# Patient Record
Sex: Female | Born: 1945
Health system: Southern US, Community
[De-identification: ages and names within clinical notes are randomized; demographics above are authoritative.]

## PROBLEM LIST (undated history)

## (undated) DIAGNOSIS — E7800 Pure hypercholesterolemia, unspecified: Secondary | ICD-10-CM

## (undated) DIAGNOSIS — D219 Benign neoplasm of connective and other soft tissue, unspecified: Secondary | ICD-10-CM

## (undated) DIAGNOSIS — E079 Disorder of thyroid, unspecified: Secondary | ICD-10-CM

## (undated) DIAGNOSIS — E039 Hypothyroidism, unspecified: Secondary | ICD-10-CM

## (undated) DIAGNOSIS — C50919 Malignant neoplasm of unspecified site of unspecified female breast: Secondary | ICD-10-CM

## (undated) DIAGNOSIS — M7061 Trochanteric bursitis, right hip: Secondary | ICD-10-CM

## (undated) DIAGNOSIS — Z9289 Personal history of other medical treatment: Secondary | ICD-10-CM

## (undated) DIAGNOSIS — Z87440 Personal history of urinary (tract) infections: Secondary | ICD-10-CM

## (undated) DIAGNOSIS — Z9221 Personal history of antineoplastic chemotherapy: Secondary | ICD-10-CM

## (undated) DIAGNOSIS — Z923 Personal history of irradiation: Secondary | ICD-10-CM

## (undated) DIAGNOSIS — M1990 Unspecified osteoarthritis, unspecified site: Secondary | ICD-10-CM

## (undated) DIAGNOSIS — R52 Pain, unspecified: Secondary | ICD-10-CM

## (undated) DIAGNOSIS — I10 Essential (primary) hypertension: Secondary | ICD-10-CM

## (undated) DIAGNOSIS — Z8719 Personal history of other diseases of the digestive system: Secondary | ICD-10-CM

## (undated) DIAGNOSIS — M5412 Radiculopathy, cervical region: Secondary | ICD-10-CM

## (undated) HISTORY — PX: OTHER SURGICAL HISTORY: SHX169

## (undated) HISTORY — PX: HYSTEROSCOPY: SHX211

## (undated) HISTORY — DX: Benign neoplasm of connective and other soft tissue, unspecified: D21.9

## (undated) HISTORY — PX: TONSILLECTOMY AND ADENOIDECTOMY: SHX28

## (undated) HISTORY — PX: TUBAL LIGATION: SHX77

## (undated) HISTORY — PX: DILATION AND CURETTAGE OF UTERUS: SHX78

## (undated) HISTORY — DX: Essential (primary) hypertension: I10

## (undated) HISTORY — PX: TOTAL HIP ARTHROPLASTY: SHX124

## (undated) HISTORY — PX: NASAL SINUS SURGERY: SHX719

## (undated) HISTORY — PX: FACIAL FRACTURE SURGERY: SHX1570

## (undated) HISTORY — DX: Pure hypercholesterolemia, unspecified: E78.00

## (undated) HISTORY — DX: Malignant neoplasm of unspecified site of unspecified female breast: C50.919

## (undated) HISTORY — DX: Personal history of irradiation: Z92.3

## (undated) HISTORY — DX: Disorder of thyroid, unspecified: E07.9

## (undated) HISTORY — PX: BREAST SURGERY: SHX581

## (undated) HISTORY — PX: THYROIDECTOMY, PARTIAL: SHX18

## (undated) HISTORY — DX: Unspecified osteoarthritis, unspecified site: M19.90

---

## 1998-05-21 ENCOUNTER — Other Ambulatory Visit: Admission: RE | Admit: 1998-05-21 | Discharge: 1998-05-21 | Payer: Self-pay | Admitting: *Deleted

## 1999-08-07 ENCOUNTER — Other Ambulatory Visit: Admission: RE | Admit: 1999-08-07 | Discharge: 1999-08-07 | Payer: Self-pay | Admitting: *Deleted

## 1999-09-11 ENCOUNTER — Other Ambulatory Visit: Admission: RE | Admit: 1999-09-11 | Discharge: 1999-09-11 | Payer: Self-pay | Admitting: *Deleted

## 1999-09-11 ENCOUNTER — Encounter (INDEPENDENT_AMBULATORY_CARE_PROVIDER_SITE_OTHER): Payer: Self-pay

## 2001-01-22 ENCOUNTER — Other Ambulatory Visit: Admission: RE | Admit: 2001-01-22 | Discharge: 2001-01-22 | Payer: Self-pay | Admitting: *Deleted

## 2004-10-02 ENCOUNTER — Other Ambulatory Visit: Admission: RE | Admit: 2004-10-02 | Discharge: 2004-10-02 | Payer: Self-pay | Admitting: *Deleted

## 2006-03-06 ENCOUNTER — Other Ambulatory Visit: Admission: RE | Admit: 2006-03-06 | Discharge: 2006-03-06 | Payer: Self-pay | Admitting: Family Medicine

## 2007-09-20 ENCOUNTER — Inpatient Hospital Stay (HOSPITAL_COMMUNITY): Admission: RE | Admit: 2007-09-20 | Discharge: 2007-09-23 | Payer: Self-pay | Admitting: Orthopedic Surgery

## 2008-09-27 ENCOUNTER — Other Ambulatory Visit: Admission: RE | Admit: 2008-09-27 | Discharge: 2008-09-27 | Payer: Self-pay | Admitting: Family Medicine

## 2010-02-19 ENCOUNTER — Emergency Department (HOSPITAL_COMMUNITY): Admission: EM | Admit: 2010-02-19 | Discharge: 2010-02-19 | Payer: Self-pay | Admitting: Emergency Medicine

## 2010-02-25 ENCOUNTER — Ambulatory Visit: Payer: Self-pay | Admitting: Gynecology

## 2010-04-19 ENCOUNTER — Other Ambulatory Visit: Payer: Self-pay | Admitting: Gynecology

## 2010-04-19 ENCOUNTER — Ambulatory Visit
Admission: RE | Admit: 2010-04-19 | Discharge: 2010-04-19 | Payer: Self-pay | Source: Home / Self Care | Attending: Gynecology | Admitting: Gynecology

## 2010-05-14 ENCOUNTER — Encounter (HOSPITAL_COMMUNITY)
Admission: RE | Admit: 2010-05-14 | Discharge: 2010-05-14 | Disposition: A | Discharge status: Home / Self Care | Payer: BC Managed Care – PPO | Source: Ambulatory Visit | Attending: Gynecology | Admitting: Gynecology

## 2010-05-14 ENCOUNTER — Institutional Professional Consult (permissible substitution) (INDEPENDENT_AMBULATORY_CARE_PROVIDER_SITE_OTHER): Payer: BC Managed Care – PPO | Admitting: Gynecology

## 2010-05-14 DIAGNOSIS — Z01812 Encounter for preprocedural laboratory examination: Secondary | ICD-10-CM | POA: Insufficient documentation

## 2010-05-14 DIAGNOSIS — Z01810 Encounter for preprocedural cardiovascular examination: Secondary | ICD-10-CM | POA: Insufficient documentation

## 2010-05-14 DIAGNOSIS — D250 Submucous leiomyoma of uterus: Secondary | ICD-10-CM

## 2010-05-14 DIAGNOSIS — N950 Postmenopausal bleeding: Secondary | ICD-10-CM

## 2010-05-14 LAB — COMPREHENSIVE METABOLIC PANEL
ALT: 28 U/L (ref 0–35)
AST: 25 U/L (ref 0–37)
Albumin: 4.2 g/dL (ref 3.5–5.2)
Alkaline Phosphatase: 57 U/L (ref 39–117)
CO2: 30 mEq/L (ref 19–32)
Chloride: 98 mEq/L (ref 96–112)
GFR calc Af Amer: >60 mL/min (ref >60)
GFR calc non Af Amer: >60 mL/min (ref >60)
Potassium: 3.2 mEq/L — ABNORMAL LOW (ref 3.5–5.1)
Sodium: 137 mEq/L (ref 135–145)
Total Bilirubin: 0.7 mg/dL (ref 0.3–1.2)

## 2010-05-14 LAB — CBC
Hemoglobin: 14.4 g/dL (ref 12.0–15.0)
Platelets: 363 10*3/uL (ref 150–400)
RBC: 4.97 MIL/uL (ref 3.87–5.11)
WBC: 7 10*3/uL (ref 4.0–10.5)

## 2010-05-15 NOTE — H&P (Addendum)
  NAMELIALA, Shannon Grant                ACCOUNT NO.:  [id number]  MEDICAL RECORD NO.:  [id number]           PATIENT TYPE:  O  LOCATION:  [facility]                           FACILITY:  [facility]  PHYSICIAN:  Rush Salce P. Tausha Milhoan, M.D.DATE OF BIRTH:  March 23, 1946  DATE OF ADMISSION:  05/14/2010 DATE OF DISCHARGE:  05/14/2010                             HISTORY & PHYSICAL   CHIEF COMPLAINT:  Postmenopausal bleeding, endometrial polyp, submucous myoma.  HISTORY OF PRESENT ILLNESS:  A 65 year old G3, P2, AB 1 female with history of postmenopausal bleeding.  Ultrasound to include sonohysterogram showed a submucous myoma with a probable endometrial polyp and she was admitted for hysteroscopy D and C, removal of endometrial cavity contents.  PAST MEDICAL HISTORY:  Significant for hypercholesterolemia, hypothyroidism, hypertension.  PAST SURGICAL HISTORY:  Includes right cheek surgery, left knee meniscus surgery, tonsillectomy, thyroidectomy, hip surgery.  ALLERGIES:  CODEINE and CIPROFLOXACIN.  CURRENT MEDICATIONS: 1. Hydrochlorothiazide 25 mg daily. 2. Simvastatin 40 mg daily. 3. Levothyroxine 100 mcg daily. 4. Multivitamins.  REVIEW OF SYSTEMS:  Noncontributory.  FAMILY HISTORY:  Noncontributory.  SOCIAL HISTORY:  Noncontributory.  PHYSICAL EXAMINATION:  VITAL SIGNS:  Afebrile.  Vital signs were stable. Blood pressure is 124/72. HEENT:  Normal. LUNGS:  Clear. CARDIAC:  Regular rate.  No rubs, murmurs, or gallops. ABDOMEN:  Benign. PELVIC:  External BUS, vagina normal.  Cervix normal, atrophic changes noted.  Bimanual uterus normal size, midline, mobile, nontender.  Adnexa without masses or tenderness.  ASSESSMENT:  A 65 year old female with postmenopausal bleeding on and off since October 2011, sonohysterogram consistent with a submucous myoma and probable endometrial polyp for hysteroscopy D and C.  I reviewed the proposed surgery with the patient to include the  expected intraoperative, postoperative courses, instrumentation, use of the resectoscope, hysteroscope, D and C portion.  Risks of infection, hemorrhage necessitating transfusion and risks of transfusion reviewed as well as risk of uterine perforation, damage to internal organs, both acutely recognized and delay recognized including bowel, bladder, ureters, vessels, and nerves necessitating major exploratory reparative surgeries, future reparative surgeries including bowel resection, bladder repair, ureteral damage repair, ostomy formation were all reviewed with her.  The issues of distended media absorption, metabolic complications, seizures reviewed, understood, and accepted.  Given the size of the myoma measuring approximately 4 cm, a possibility that we will not be able to remove all of it during this procedure was reviewed and she understands that we may leave parts of the fibroid but at least we will be able to sample and better assess the cavity during the procedure.  If everything appears benign and we are able to sample but leave parts of the fibroid that we may suggest expectant management and she is asymptomatic from this and we will further discuss following the procedure, pending results of the procedure.  The patient's questions were answered to her satisfaction.  She is ready to proceed with surgery.     Shannon Grant P. Audie Box, M.D.     TPF/MEDQ  D:  05/14/2010  T:  05/15/2010  Job:  [id number]  Electronically Signed by Colin Broach M.D. on 05/15/2010 11:51:00 AM

## 2010-05-20 ENCOUNTER — Ambulatory Visit (HOSPITAL_COMMUNITY)
Admission: RE | Admit: 2010-05-20 | Discharge: 2010-05-20 | Disposition: A | Discharge status: Home / Self Care | Payer: BC Managed Care – PPO | Source: Ambulatory Visit | Attending: Gynecology | Admitting: Gynecology

## 2010-05-20 ENCOUNTER — Other Ambulatory Visit: Payer: Self-pay | Admitting: Gynecology

## 2010-05-20 DIAGNOSIS — D250 Submucous leiomyoma of uterus: Secondary | ICD-10-CM | POA: Insufficient documentation

## 2010-05-20 DIAGNOSIS — N950 Postmenopausal bleeding: Secondary | ICD-10-CM

## 2010-05-20 DIAGNOSIS — N840 Polyp of corpus uteri: Secondary | ICD-10-CM

## 2010-05-20 DIAGNOSIS — Z01818 Encounter for other preprocedural examination: Secondary | ICD-10-CM | POA: Insufficient documentation

## 2010-05-20 DIAGNOSIS — Z01812 Encounter for preprocedural laboratory examination: Secondary | ICD-10-CM | POA: Insufficient documentation

## 2010-05-24 NOTE — Op Note (Addendum)
Shannon Grant, Shannon Grant                ACCOUNT NO.:  [id number]  MEDICAL RECORD NO.:  [id number]           PATIENT TYPE:  O  LOCATION:  [facility]                          FACILITY:  [facility]  PHYSICIAN:  Wendelyn Kiesling P. Kinsleigh Ludolph, M.D.DATE OF BIRTH:  May 20, 1945  DATE OF PROCEDURE:  05/20/2010 DATE OF DISCHARGE:  05/20/2010                              OPERATIVE REPORT   PREOPERATIVE DIAGNOSES:  Postmenopausal bleeding, submucous myoma, endometrial polyp.  POSTOPERATIVE DIAGNOSES:  Postmenopausal bleeding, submucous myoma, endometrial polyp.  PROCEDURE:  Hysteroscopic resection of submucous myoma, excision of endometrial polyp, dilation and curettage.  SURGEON:  Altheia Shafran P. Laurelai Lepp, MD  ANESTHETIC:  General with 1% lidocaine paracervical block.  ESTIMATED BLOOD LOSS:  Minimal.  SALINE DISCREPANCY:  1000 mL.  SPECIMENS: 1. Endometrial curetting 2. Submucous myoma fragments. 3. Endometrial polyp to Pathology.  FINDINGS:  EUA, external BUS vagina with atrophic genital changes, cervix normal.  Bimanual uterus grossly normal size, midline, and mobile.  Adnexa without masses.  Hysteroscopic with large submucous myoma filling the endometrial cavity from mid to posterior uterine wall, smaller lower uterine segment, submucous myoma, right lateral wall, finger-like endometrial polyp, anterior fundal cavity, tight tubal ostia visualized, and left tubal ostia was obscured by myoma.  Hysteroscopy was adequate, noting fundus anterior and posterior uterine surfaces, lower uterine segment, endocervical canal all visualized.  Right tubal ostia was visualized.  Left tubal ostia was not visualized.  DESCRIPTION OF PROCEDURE:  The patient was taken to the operating room, underwent general anesthesia, placed in low dorsal lithotomy position, received a perineal vaginal preparation with Betadine solution.  Bladder emptied with in-and-out Foley catheterization.  EUA performed.  The patient was draped in usual  fashion.  The cervix was visualized with a weighted speculum.  Anterior lip grasped with a single-tooth tenaculum and a paracervical block using 1% lidocaine was placed, total of 10 mL. The cervix was gently dilated with Versapoint hysteroscope and hysteroscopy was performed with findings noted above.  Using the right angle Versapoint resectoscopic loop, the endometrial polyp was excised at the base and sent to Pathology.  Through progressive passes, the large and small submucous myomas were resected noting complete removal of the small myoma and approximately 90% removal of the larger myoma. At that point, there was no longer myoma protruding into the cavity, it was felt safe to chase the myoma into the wall of the myometrium.  A sharp curettage was then performed and this endometrial curetting was sent to Pathology as a separate specimen as well as all of the myoma fragments removed.  The hysteroscopy showed that good distention, no evidence of perforation.  Several small bleeding points along the edge of the myoma and endometrial junction were cauterized.  The hysteroscope was removed.  Tenaculum was removed.  Several minutes were passed to assure complete hemostasis and there was no active bleeding from the cervical os or from the tenaculum site.  The patient received intraoperative Toradol.  She was placed in supine position. Subsequently awakened without difficulty and taken to recovery room in good condition having tolerated the procedure well.  The estimated saline discrepancy was approximately 1000  mL, although there was abundant saline on the floor from spillage during the case.     Eilis Chestnutt P. Audie Box, M.D.     TPF/MEDQ  D:  05/20/2010  T:  05/21/2010  Job:  [id number]  Electronically Signed by Colin Broach M.D. on 05/24/2010 09:13:20 AM

## 2010-05-29 ENCOUNTER — Ambulatory Visit (INDEPENDENT_AMBULATORY_CARE_PROVIDER_SITE_OTHER): Payer: BC Managed Care – PPO | Admitting: Gynecology

## 2010-05-29 DIAGNOSIS — Z9889 Other specified postprocedural states: Secondary | ICD-10-CM

## 2010-06-03 ENCOUNTER — Ambulatory Visit: Payer: BC Managed Care – PPO | Admitting: Gynecology

## 2010-06-11 LAB — URINALYSIS, ROUTINE W REFLEX MICROSCOPIC
Bilirubin Urine: NEGATIVE
Hgb urine dipstick: NEGATIVE
Ketones, ur: NEGATIVE mg/dL
Nitrite: NEGATIVE
Protein, ur: NEGATIVE mg/dL
Specific Gravity, Urine: 1.019 (ref 1.005–1.030)
Urobilinogen, UA: 0.2 mg/dL (ref 0.0–1.0)

## 2010-06-11 LAB — CBC
Hemoglobin: 13.9 g/dL (ref 12.0–15.0)
MCH: 30.5 pg (ref 26.0–34.0)
MCHC: 34.5 g/dL (ref 30.0–36.0)
RDW: 14 % (ref 11.5–15.5)

## 2010-06-11 LAB — DIFFERENTIAL
Basophils Absolute: 0.1 10*3/uL (ref 0.0–0.1)
Basophils Relative: 0 % (ref 0–1)
Eosinophils Absolute: 0.1 10*3/uL (ref 0.0–0.7)
Monocytes Absolute: 0.7 10*3/uL (ref 0.1–1.0)
Monocytes Relative: 5 % (ref 3–12)
Neutrophils Relative %: 88 % — ABNORMAL HIGH (ref 43–77)

## 2010-06-11 LAB — BASIC METABOLIC PANEL
BUN: 12 mg/dL (ref 6–23)
CO2: 30 mEq/L (ref 19–32)
Calcium: 9.9 mg/dL (ref 8.4–10.5)
Glucose, Bld: 92 mg/dL (ref 70–99)
Sodium: 140 mEq/L (ref 135–145)

## 2010-06-11 LAB — WET PREP, GENITAL

## 2010-06-11 LAB — GC/CHLAMYDIA PROBE AMP, GENITAL: Chlamydia, DNA Probe: NEGATIVE

## 2010-08-13 NOTE — Op Note (Signed)
[name], Grant NO.:  [id number]   MEDICAL RECORD NO.:  [id number]          PATIENT TYPE:  INP   LOCATION:  0012                         FACILITY:  [facility]   PHYSICIAN:  Ollen Gross, M.D.    DATE OF BIRTH:  1945-11-18   DATE OF PROCEDURE:  09/20/2007  DATE OF DISCHARGE:                               OPERATIVE REPORT   PREOPERATIVE DIAGNOSIS:  Osteoarthritis, right hip.   POSTOPERATIVE DIAGNOSIS:  Osteoarthritis, right hip.   PROCEDURE:  Right total hip arthroplasty.   SURGEON:  Aluisio   ASSISTANT:  Avel Peace, P.A.-C.   ANESTHESIA:  Spinal.   ESTIMATED BLOOD LOSS:  300.   DRAIN:  None.   COMPLICATIONS:  None.   CONDITION:  Stable to recovery.   BRIEF CLINICAL NOTE:  [name] is a 65 year old female with severe end-  stage arthritis of the right hip with progressively worsening pain and  dysfunction.  She has failed nonoperative management and presents now  for right total hip arthroplasty.   PROCEDURE IN DETAIL:  After the successful administration of spinal  anesthetic, the patient is placed in the left lateral decubitus position  with the right side up and held with the hip positioner.  The right  lower extremity is isolated from her perineum with plastic drapes and  prepped and draped in the usual sterile fashion.  A sort posterolateral  incision is made with a 10 blade through subcutaneous tissue to the  level of the fascia lata which is incised in line with the skin  incision.  The sciatic nerve is palpated and protected and the short  external rotators isolated off the femur.  Capsulectomy is performed,  and the hip is dislocated.  The center of the femoral head is marked,  and a trial prosthesis is placed such that the center of the trial head  corresponds to the center of the native femoral head.  Osteotomy line is  marked on the femoral neck, and osteotomy is made with an oscillating  saw.  Femoral head is removed and the femur  retracted anteriorly to gain  acetabular exposure.   Acetabular retractors are placed, and labrum and osteophytes are  removed.  Reaming starts at 43 mm, coursing in increments of 2 to 51 mm,  and then a 52 mm Pinnacle acetabular shell is placed in anatomic  position and transfixed with a single dome screw.  The apex hole  eliminator is placed, and then the permanent 36 mm Ultramet metal liner  is placed.  This is a metal on metal hip replacement.   The femur is then prepared with the canal finder and irrigation.  Axial  remaining is performed to 13.5 mm, proximal reaming to an 31F, and the  sleeve machined to a large.  An 31F large trial sleeve is placed and 18  x 13 stem and a 36 plus 9 neck, about 10 degrees less than her native  anteversion.  She had some excessive anteversion with her native  anatomy.  The trial 36 plus 0 head is placed, and the hip is reduced  with outstanding stability.  She has full extension, full external  rotation, 70 degrees flexion, 40 degrees adduction, 90 degrees internal  rotation, then 90 degrees of flexion, 70 degrees of internal rotation.  By placing the right leg on top of the left, it was felt as though the  leg lengths were equal.  Hip is then dislocated and trials removed.  Permanent 51F large sleeve is placed with the 18 x 13 stem and a 36 plus  8 neck, 10 degrees less than the native anteversion.  The 36 plus 0 head  is placed, and the hip is reduced with the same stability parameters.  The wound is copiously irrigated with saline solution, and short  rotators are reattached to the femur through drill holes.  Fascia lata  is closed with interrupted #1 Vicryl, subcu closed with running 0 coil  and then 2-0 Vicryl and subcuticular running 4-0 Monocryl.  The incision  is cleaned and dried and Steri-Strips and a bulky sterile dressing  applied.  She is then placed into a knee immobilizer, awakened, and  transported to recovery in stable  condition.      Ollen Gross, M.D.  Electronically Signed     FA/MEDQ  D:  09/20/2007  T:  09/20/2007  Job:  [id number]

## 2010-08-13 NOTE — Discharge Summary (Signed)
[name], Grant                ACCOUNT NO.:  [id number]   MEDICAL RECORD NO.:  [id number]          PATIENT TYPE:  INP   LOCATION:  1617                         FACILITY:  [facility]   PHYSICIAN:  Shannon Grant, M.D.    DATE OF BIRTH:  1945/07/28   DATE OF ADMISSION:  09/20/2007  DATE OF DISCHARGE:  09/23/2007                               DISCHARGE SUMMARY   ADMITTING DIAGNOSES:  1. Osteoarthritis, right hip.  2. History of shingles.  3. Hypertension.  4. Elevated cholesterol.  5. Varicose veins.  6. Hemorrhoids.  7. Past history of rectal bleeding secondary to Aleve.  8. Hypothyroidism.  9. Past history of transfusions in 1966.  10.Postmenopausal.  11.Childhood illnesses to include measles, mumps, rubella.   DISCHARGE DIAGNOSES:  1. Osteoarthritis, right hip, status post right total hip replacement      arthroplasty.  2. Postop hypokalemia.  3. Mild postop blood loss anemia, did not require transfusion.  4. History of shingles.  5. Hypertension.  6. Elevated cholesterol.  7. Varicose veins.  8. Hemorrhoids.  9. Past history of rectal bleeding secondary to Aleve.  10.Hypothyroidism.  11.Past history of transfusions in 1966.  12.Postmenopausal.  13.Childhood illnesses to include measles, mumps, rubella.   PROCEDURE:  September 20, 2007, right total hip.   SURGEON:  Dr. Lequita Halt.   ASSISTANT:  Avel Peace, PA-C.   Spinal anesthesia.   CONSULTS:  None.   BRIEF HISTORY:  She is a 65 year old female with severe end-stage  arthritis of the right hip, aggressive worsening pain and dysfunction,  has been off her meds, but now presents for total hip arthroplasty.   LABORATORY DATA:  Preop CBC, hemoglobin of 14.2, hematocrit of 41.9,  white cell count 5.0, platelets 320.  Chem panel on admission, she did  have a slight low potassium on admission of 3.2.  Remaining chem panel  within normal limits.  PT/INR 12.8 and 0.9 with a PTT of 32.  Preop UA  negative.  Serial pro times  followed, last noted PT/INR 19.9 and 1.6.  Serial CBCs were followed.  Hemoglobin dropped down to 11 then 10.1.  Last H&H 9.4 and 27.6.  Serial __________ were followed.  Potassium went  up to 3.7 then dropped down low, probably dilutional component, to 2.5,  back up to 3.5, last noted at 3.3.  She was given some potassium  supplements prior to discharge.  Followup UA on September 21, 2007, was  negative.  Portable chest, September 21, 2007, plate-like atelectasis at the  left lung base, subsegmental atelectasis at the right lung base.  Preop  chest x-ray, no active disease done on September 15, 2007.  Hip films, September 15, 2007, extensive degenerative changes, right hip, no acute fracture  or subluxation   EKG, April of 2009, sinus rhythm confirmed.   HOSPITAL COURSE:  The patient was admitted at [facility] [place],  tolerated the procedure well, and later transferred to the recovery room  on orthopedic floor, started on PCA and p.o. analgesic pain control  following surgery, given 24 hours postop IV antibiotics, started on  Coumadin  for DVT prophylaxis, did have a fair amount of pain through the  night but doing a little bit better on the morning of day 1.  Complaints  of dry mouth secondary to the medication.  Started getting up out of  bed.  She ran a little lower on her hemoglobin.  Output was a little on  the lower side but adequate so we gave her some gentle fluids to help  out with the pressure around the output, was slow to get up that first  day.  Unfortunately, she spiked a temp on the late morning of day 1.  We  ordered a portable chest x-ray which showed atelectasis.  We also  ordered a urinalysis which proved to be negative.  She was treated with  antipyretics and incentive spirometer which did bring her temperature  down.  She did go back up on postop day 2 again, encouraged the same,  and she was afebrile at the end of day 2 through day 3.  Potassium  dropped on day 2 .  She was given  potassium supplements which she  responded to.  She actually started out a little low probably due to her  medications.  She was on a fluid pill at home _______________.  On day  2, she did a little bit better with her therapy, starting to get up with  PT a little bit more by day 3.  Electrolyte had corrected itself.  She  started getting up with more therapy, doing well, no pain.  Tolerating  her meds and was discharged home.   DISCHARGE/PLAN:  1. Discharged home on September 23, 2007.  2. Discharge diagnoses, please see above.  3. Discharge meds, Coumadin, Percocet, Robaxin, Nu-Iron.   DIET:  Heart-healthy supplemental diet.   FOLLOWUP:  Two weeks.   ACTIVITY:  Partial weightbearing 25% of the right lower extremity,  __________ nursing.   DISPOSITION:  Home.   CONDITION ON DISCHARGE:  Improved.      Shannon Grant, P.A.C.      Shannon Grant, M.D.  Electronically Signed    ALP/MEDQ  D:  09/23/2007  T:  09/23/2007  Job:  [id number]   cc:   Shannon Grant, M.D.  Fax: [phone]   Chart   Shannon L. Julien Girt, P.A.C.

## 2010-08-13 NOTE — H&P (Signed)
Shannon Grant, Shannon Grant NO.:  [id number]   MEDICAL RECORD NO.:  [id number]          PATIENT TYPE:  INP   LOCATION:  0012                         FACILITY:  [facility]   PHYSICIAN:  Ollen Gross, M.D.    DATE OF BIRTH:  1945-05-29   DATE OF ADMISSION:  09/20/2007  DATE OF DISCHARGE:                              HISTORY & PHYSICAL   CHIEF COMPLAINT:  Right hip pain.   HISTORY OF PRESENT ILLNESS:  The patient is a 65 year old female who has  been seen by Dr. Lequita Halt second opinion earlier this year for hip and  knee pain.  She was found to have significant arthritis.  She had been  seen by Dr. Lajoyce Corners in the past and told at some point she would need hip  replacement.  Her father-in-law has been a patient of Dr. Lequita Halt in the  past, so she came over for second opinion.  She was found to have severe  end-stage erosive arthritis of the right hip with bone-on-bone with  subchondral cyst formation in the femoral head.  This has been  progressive in nature.  She is felt to be a good candidate.  The risks  and benefits have been discussed and she elects to proceed with surgery.   ALLERGIES:  1. CIPRO CAUSES HIVES AND RASH.  2. CODEINE CAUSES NAUSEA AND VOMITING.   THE PATIENT IS ABLE TO TAKE VICODIN.   CURRENT MEDICATIONS:  She is on blood pressure pill, which she did not  remember the name of.  Simvastatin, Armour Thyroid multivitamin, Tylenol  Arthritis, tramadol and Norco.   PAST MEDICAL HISTORY:  1. History of shingles.  2. Hypertension.  3. Elevated cholesterol.  4. Varicose veins.  5. Hemorrhoids.  6. Past history of rectal bleeding secondary to Aleve.  7. Hypothyroidism.  8. Past history of transfusions in 1966.  9. Postmenopausal.  10.Childhood illnesses to include measles, mumps and rubella.   PAST SURGICAL HISTORY:  1. In 1966, she underwent reduction of facial fractures.  2. C-section in 1977.  3. Thyroid nodules in 1990.  4. Left knee surgery in 1962  and bone graft from her skull to her      cheek in 1996.   SOCIAL HISTORY:  Divorced.  Teacher.  Past smoker.  Glass of wine daily.  Three children.  Lives alone.  Would like to look into a rehab facility.   FAMILY HISTORY:  Father deceased at age 4 with heart failure.  Mother  suicide at age 52.  Three children.   REVIEW OF SYSTEMS:  GENERAL:  No fevers, chills or night sweats.  NEURO:  No seizures, syncope or paralysis.  RESPIRATORY:  No shortness breath, productive cough or hemoptysis.  CARDIOVASCULAR:  No chest pain, angina or orthopnea.  GI:  A little bit of constipation.  No nausea, vomiting or diarrhea.  No  blood or mucus in stool, although she has had past history of rectal  bleeding secondary to Aleve, but none recently.  GU:  No dysuria, hematuria or discharge.  MUSCULOSKELETAL:  Right hip.   PHYSICAL EXAMINATION:  VITAL  SIGNS:  Pulse 84, respirations 14, blood  pressure 148/82.  GENERAL:  A 65 year old white female, well-nourished, well-developed in  no acute distress.  She is alert and cooperative, pleasant.  HEENT:  Normocephalic, atraumatic.  Pupils are round and reactive.  Oropharynx is clear.  EOMs intact.  NECK:  Supple.  CHEST:  Clear.  HEART:  Regular rate and rhythm.  No murmur.  ABDOMEN:  Soft, nontender.  Bowel sounds present.  RECTAL/BREASTS/GENITALIA:  Not done, not pertinent to present illness.  EXTREMITIES:  Right hip flexion 90 to 0, internal rotation 0, external  rotation 10 degrees abduction.   IMPRESSION:  Osteoarthritis right hip.   PLAN:  The patient admitted to [facility] to undergo a right  total hip replacement arthroplasty.  The surgery will be performed by  Dr. Ollen Gross.      Alexzandrew L. Perkins, P.A.C.      Ollen Gross, M.D.  Electronically Signed    ALP/MEDQ  D:  09/20/2007  T:  09/20/2007  Job:  [id number]   cc:   Ollen Gross, M.D.  Fax: [phone]

## 2010-10-03 ENCOUNTER — Ambulatory Visit (HOSPITAL_COMMUNITY)
Admission: RE | Admit: 2010-10-03 | Discharge: 2010-10-03 | Disposition: A | Discharge status: Home / Self Care | Payer: BC Managed Care – PPO | Source: Ambulatory Visit | Attending: Family Medicine | Admitting: Family Medicine

## 2010-10-03 DIAGNOSIS — M79609 Pain in unspecified limb: Secondary | ICD-10-CM | POA: Insufficient documentation

## 2010-12-26 LAB — BASIC METABOLIC PANEL
BUN: 4 — ABNORMAL LOW
CO2: 31
CO2: 34 — ABNORMAL HIGH
Calcium: 8.5
Calcium: 8.7
Calcium: 8.7
Calcium: 8.8
Creatinine, Ser: 0.49
Creatinine, Ser: 0.52
GFR calc Af Amer: >60
GFR calc Af Amer: >60
GFR calc Af Amer: >60
GFR calc non Af Amer: >60
GFR calc non Af Amer: >60
Glucose, Bld: 111 — ABNORMAL HIGH
Glucose, Bld: 124 — ABNORMAL HIGH
Sodium: 138
Sodium: 140

## 2010-12-26 LAB — COMPREHENSIVE METABOLIC PANEL
ALT: 32
Alkaline Phosphatase: 66
CO2: 29
Chloride: 101
GFR calc non Af Amer: >60
Glucose, Bld: 92
Potassium: 3.2 — ABNORMAL LOW
Sodium: 140
Total Protein: 6.9

## 2010-12-26 LAB — CBC
Hemoglobin: 14.2
Hemoglobin: 9.4 — ABNORMAL LOW
MCHC: 34.1
Platelets: 208
RBC: 3.67 — ABNORMAL LOW
RBC: 4.78
RDW: 13.9
RDW: 14
RDW: 14

## 2010-12-26 LAB — URINALYSIS, ROUTINE W REFLEX MICROSCOPIC
Bilirubin Urine: NEGATIVE
Ketones, ur: NEGATIVE
Nitrite: NEGATIVE
Nitrite: NEGATIVE
Specific Gravity, Urine: 1.016
Urobilinogen, UA: 0.2
pH: 5.5

## 2010-12-26 LAB — PROTIME-INR
INR: 1.1
INR: 1.6 — ABNORMAL HIGH
INR: 1.8 — ABNORMAL HIGH
Prothrombin Time: 12.8
Prothrombin Time: 14.2
Prothrombin Time: 21 — ABNORMAL HIGH

## 2010-12-26 LAB — TYPE AND SCREEN: Antibody Screen: NEGATIVE

## 2011-10-01 ENCOUNTER — Encounter: Payer: Self-pay | Admitting: Gynecology

## 2011-10-01 ENCOUNTER — Ambulatory Visit (INDEPENDENT_AMBULATORY_CARE_PROVIDER_SITE_OTHER): Payer: BC Managed Care – PPO | Admitting: Gynecology

## 2011-10-01 VITALS — BP 130/80 | Ht 64.5 in | Wt 188.0 lb

## 2011-10-01 DIAGNOSIS — Z01419 Encounter for gynecological examination (general) (routine) without abnormal findings: Secondary | ICD-10-CM

## 2011-10-01 DIAGNOSIS — N952 Postmenopausal atrophic vaginitis: Secondary | ICD-10-CM

## 2011-10-01 DIAGNOSIS — D259 Leiomyoma of uterus, unspecified: Secondary | ICD-10-CM

## 2011-10-01 NOTE — Patient Instructions (Signed)
Follow up for ultrasound as scheduled 

## 2011-10-01 NOTE — Progress Notes (Signed)
ALYSSIA HEESE 10-06-1945 [id number]        66 y.o.  For follow up exam.  Several issues notable low.  Past medical history,surgical history, medications, allergies, family history and social history were all reviewed and documented in the EPIC chart. ROS:  Was performed and pertinent positives and negatives are included in the history.  Exam: Sherrilyn Rist assistant Filed Vitals:   10/01/11 1431  BP: 130/80   General appearance  Normal Skin grossly normal Head/Neck normal with no cervical or supraclavicular adenopathy thyroid normal Lungs  clear Cardiac RR, without RMG Abdominal  soft, nontender, without masses, organomegaly or hernia Breasts  examined lying and sitting without masses, retractions, discharge or axillary adenopathy. Pelvic  Ext/BUS/vagina  normal with atrophic genital changes.  Cervix  normal with atrophic changes  Uterus  retroverted, difficult to palpate feels somewhat bulky, midline and mobile, nontender   Adnexa  Without masses or tenderness    Anus and perineum  normal   Rectovaginal  normal sphincter tone without palpated masses or tenderness. None acute external hemorrhoids.   Assessment/Plan:  66 y.o. female for follow up exam.    1. Leiomyoma. Patient recently saw her orthopedist for some hip issues, they did x-rays and apparently saw calcifications within her myoma and felt that her myomas have enlarged. Last evaluation showed ultrasound January 2012 with 45 mm and 48 mm myomas. She underwent a hysteroscopic myomectomy with resection of a large submucous myoma. She's done well since then other than complaining of some pelvic pressure symptoms. Exam today shows her uterus to be bulky although difficult to palpate, retroverted. We'll start with ultrasound compared to prior ultrasound and we'll go from there. She's had no bleeding or any other symptoms. 2. Pap smear. No Pap smear done today.  She has no history of abnormal Pap smears previously and has been getting them  through Dr. Marya Amsler office. Reviewed current screening guidelines and at this point we'll stop routine screening as she is over the age of 38 with no history of abnormal Pap smears. 3. Mammography. Patient is overdue for her mammogram and knows importance of scheduling this. She agrees to do so. SBE monthly reviewed. 4. Bone density. Patient relates being overdue for this also and she gets this at the mammography facility and she agrees to schedule this also. 5. Colonoscopy. Patient is overdue for this, acknowledges my recommendation to schedule this. 6. Health maintenance. No blood work was done as this is all done through Dr. Marya Amsler office who she sees routinely. Patient will follow up for ultrasound and then we'll go from there.    Dara Lords MD, 3:03 PM 10/01/2011

## 2011-10-10 ENCOUNTER — Telehealth: Payer: Self-pay

## 2011-10-10 ENCOUNTER — Ambulatory Visit (INDEPENDENT_AMBULATORY_CARE_PROVIDER_SITE_OTHER): Payer: BC Managed Care – PPO

## 2011-10-10 ENCOUNTER — Encounter: Payer: Self-pay | Admitting: Gynecology

## 2011-10-10 ENCOUNTER — Ambulatory Visit (INDEPENDENT_AMBULATORY_CARE_PROVIDER_SITE_OTHER): Payer: BC Managed Care – PPO | Admitting: Gynecology

## 2011-10-10 DIAGNOSIS — D259 Leiomyoma of uterus, unspecified: Secondary | ICD-10-CM

## 2011-10-10 NOTE — Telephone Encounter (Signed)
CHRISTY COULD NOT FIND ANY RECORD OF AN MRI OF PT. ORDERED BY DR. Lequita Halt.

## 2011-10-10 NOTE — Patient Instructions (Signed)
Follow up for ultrasound study in 3 months as scheduled.

## 2011-10-10 NOTE — Telephone Encounter (Signed)
Message copied by Venora Maples on Fri Oct 10, 2011  3:14 PM ------      Message from: Dara Lords      Created: Fri Oct 10, 2011  3:01 PM       I need a copy of MRI from Dr. Homero Fellers [facility]  They described a calcified myoma on MRI done for her hip that I would like to see their description of.

## 2011-10-10 NOTE — Progress Notes (Signed)
Patient also for ultrasound. Has a history of leiomyoma status post hysteroscopic resection of submucous leiomyoma and endometrial polyp 05/2010.  Saw orthopedist who did an MRI of her pelvis and noted there was a calcified probable fibroid that looked larger than the last time it asked her to come see [place].  Ultrasound today shows calcified myoma 50 x 55 mm and several other smaller myomas. Right left ovaries appear normal. No cul-de-sac fluid. She does have some fluid in the endometrial cavity which appears to outline a polypoid mass 9 x 4 mm. Endometrium echo is measuring 4.5 mm.  Assessment and plan:  Overall uterine size the same as prior ultrasound 03/2010. Larger myoma at 50 x 55 mm. Prior measurements were 48 mm. Feel this is all within roughly the same size. No evidence of significant large myomas or other masses. The endometrial cavity does appear to have a defect although she did have a submucous myomectomy previously I wonder if this is not a remnant. The pathology from her prior hysteroscopy showed a benign endometrial polyp and benign leiomyoma. Various options to include hysteroscopy nail versus reultrasound and 3-6 months discussed. Patient would prefer reultrasound in 3 months. She understands the possibilities to include hysteroscopy at that time. The various pathology possibilities to include benign submucous myoma, endometrial polyps, hyperplastic changes and uterine cancer. I'm going to have a copy of the MRI also forwarded so I can review this.

## 2011-10-16 ENCOUNTER — Encounter: Payer: Self-pay | Admitting: Gynecology

## 2011-10-28 DIAGNOSIS — R1011 Right upper quadrant pain: Secondary | ICD-10-CM | POA: Diagnosis not present

## 2011-10-30 ENCOUNTER — Other Ambulatory Visit: Payer: Self-pay | Admitting: *Deleted

## 2011-10-30 ENCOUNTER — Other Ambulatory Visit: Payer: Self-pay | Admitting: Gynecology

## 2011-10-30 DIAGNOSIS — N6310 Unspecified lump in the right breast, unspecified quadrant: Secondary | ICD-10-CM

## 2012-01-22 ENCOUNTER — Other Ambulatory Visit: Payer: BC Managed Care – PPO

## 2012-01-22 ENCOUNTER — Ambulatory Visit: Payer: BC Managed Care – PPO | Admitting: Gynecology

## 2012-01-26 ENCOUNTER — Other Ambulatory Visit: Payer: BC Managed Care – PPO

## 2012-01-26 ENCOUNTER — Ambulatory Visit: Payer: BC Managed Care – PPO | Admitting: Gynecology

## 2012-02-09 ENCOUNTER — Other Ambulatory Visit: Payer: Self-pay | Admitting: Gynecology

## 2012-02-09 ENCOUNTER — Ambulatory Visit (INDEPENDENT_AMBULATORY_CARE_PROVIDER_SITE_OTHER): Payer: BC Managed Care – PPO | Admitting: Gynecology

## 2012-02-09 ENCOUNTER — Encounter: Payer: Self-pay | Admitting: Gynecology

## 2012-02-09 ENCOUNTER — Ambulatory Visit (INDEPENDENT_AMBULATORY_CARE_PROVIDER_SITE_OTHER): Payer: BC Managed Care – PPO

## 2012-02-09 DIAGNOSIS — N840 Polyp of corpus uteri: Secondary | ICD-10-CM

## 2012-02-09 DIAGNOSIS — D250 Submucous leiomyoma of uterus: Secondary | ICD-10-CM

## 2012-02-09 DIAGNOSIS — D259 Leiomyoma of uterus, unspecified: Secondary | ICD-10-CM

## 2012-02-09 DIAGNOSIS — D251 Intramural leiomyoma of uterus: Secondary | ICD-10-CM

## 2012-02-09 DIAGNOSIS — N854 Malposition of uterus: Secondary | ICD-10-CM

## 2012-02-09 DIAGNOSIS — D252 Subserosal leiomyoma of uterus: Secondary | ICD-10-CM

## 2012-02-09 DIAGNOSIS — N859 Noninflammatory disorder of uterus, unspecified: Secondary | ICD-10-CM

## 2012-02-09 DIAGNOSIS — N83339 Acquired atrophy of ovary and fallopian tube, unspecified side: Secondary | ICD-10-CM

## 2012-02-09 MED ORDER — MISOPROSTOL 200 MCG PO TABS: ORAL_TABLET | ORAL | Status: DC

## 2012-02-09 NOTE — Progress Notes (Signed)
Patient presents for sonohysterogram.  Has a history of leiomyoma status post hysteroscopic resection of submucous leiomyoma and endometrial polyp 05/2010. Saw orthopedist who did an MRI of her pelvis and noted there was a calcified probable fibroid that looked larger than the last time it asked her to come see [place].  Ultrasound July 2013 showed leiomyomata that were felt overall to be stable but some question of endometrial cavity defects. She was asked to come back for a sonohysterogram after several months to reassess the cavity.  Sonohysterogram shows uterus with numerous leiomyoma noting one calcified at 53 mm. Endometrial cavity is filled with fluid with 3 echogenic defects and a calcified submucous myoma measuring 28 mm. Right and left ovaries are visualized and atrophic. Cul-de-sac is negative. Sonohysterogram performed, sterile technique, good distention with multiple endometrial cavity defects consistent with polyps and a larger submucous myoma. Endometrial sample taken, patient tolerated well. He did appear to be plugging of the catheter on sampling with relatively scant return.  Assessment and plan: Recurrent and endometrial polyps and submucous myoma. History of hysteroscopy D&C with removal of polyps and submucous myoma February 2012. Recommend proceeding with repeat hysteroscopy now. I discussed the risks benefits versus hysterectomy with her as she was questioning whether she should proceed with hysterectomy. I think given that she is otherwise asymptomatic not having pressure/pain symptoms and no bleeding at the least invasive is warned that at this time. She clearly understands no guarantees as far as the recurrence risk and that she may again have problems in the future and at this point we may consider hysterectomy as she is comfortable with the least invasive procedure at this point. We will schedule this at her convenience and she'll represent for a preoperative consult.

## 2012-02-09 NOTE — Patient Instructions (Signed)
Office will contact you to arrange surgery. 

## 2012-03-09 SURGERY — Surgical Case
Anesthesia: *Unknown

## 2012-03-16 ENCOUNTER — Encounter (HOSPITAL_COMMUNITY): Payer: Self-pay | Admitting: Pharmacist

## 2012-03-19 ENCOUNTER — Telehealth: Payer: Self-pay | Admitting: *Deleted

## 2012-03-19 NOTE — Telephone Encounter (Signed)
Left message on pt voicemail to make sure she picked up vaginal tablet (cytotec) for surgery and to place vaginally the night before surgery on dec 30 th.

## 2012-03-25 ENCOUNTER — Other Ambulatory Visit: Payer: Self-pay

## 2012-03-25 ENCOUNTER — Encounter (HOSPITAL_COMMUNITY)
Admission: RE | Admit: 2012-03-25 | Discharge: 2012-03-25 | Disposition: A | Discharge status: Home / Self Care | Payer: BC Managed Care – PPO | Source: Ambulatory Visit | Attending: Gynecology | Admitting: Gynecology

## 2012-03-25 ENCOUNTER — Ambulatory Visit (INDEPENDENT_AMBULATORY_CARE_PROVIDER_SITE_OTHER): Payer: BC Managed Care – PPO | Admitting: Gynecology

## 2012-03-25 ENCOUNTER — Encounter (HOSPITAL_COMMUNITY): Payer: Self-pay

## 2012-03-25 ENCOUNTER — Encounter: Payer: Self-pay | Admitting: Gynecology

## 2012-03-25 DIAGNOSIS — D259 Leiomyoma of uterus, unspecified: Secondary | ICD-10-CM

## 2012-03-25 DIAGNOSIS — N840 Polyp of corpus uteri: Secondary | ICD-10-CM

## 2012-03-25 LAB — COMPREHENSIVE METABOLIC PANEL
ALT: 18 U/L (ref 0–35)
Albumin: 4 g/dL (ref 3.5–5.2)
Alkaline Phosphatase: 66 U/L (ref 39–117)
BUN: 13 mg/dL (ref 6–23)
Calcium: 10.1 mg/dL (ref 8.4–10.5)
Potassium: 3.6 mEq/L (ref 3.5–5.1)
Sodium: 139 mEq/L (ref 135–145)
Total Protein: 7.1 g/dL (ref 6.0–8.3)

## 2012-03-25 LAB — CBC
MCHC: 33.3 g/dL (ref 30.0–36.0)
RDW: 13.7 % (ref 11.5–15.5)

## 2012-03-25 MED ORDER — MISOPROSTOL 200 MCG PO TABS: ORAL_TABLET | ORAL | Status: DC

## 2012-03-25 NOTE — Patient Instructions (Signed)
Follow up for surgery as scheduled. Call sooner if you have any questions at all.

## 2012-03-25 NOTE — H&P (Signed)
  Shannon Grant [birth year] [id number]   History and Physical  Chief complaint: leiomyoma/endometrial polyps.  History of present illness: 66 y.o. G3P3 history of leiomyoma submucous and endometrial polyps status post hysteroscopic resection 2012.  Had a recent evaluation by orthopedics where x-ray showed calcified leiomyoma and she was referred back for evaluation. Ultrasound with sonohysterogram showed several small endometrial defects consistent with endometrial polyps and a larger submucous myoma. Patient is otherwise asymptomatic as far as bleeding. She does have some fleeting abdominal discomfort.  Options for management include observation versus intervention reviewed and the patient's admitted for hysteroscopic evaluation and resection of endometrial defects.  Past medical history,surgical history, medications, allergies, family history and social history were all reviewed and documented in the EPIC chart. ROS:  Was performed and pertinent positives and negatives are included in the history of present illness.  Exam:  Higher education careers adviser General: well developed, well nourished female, no acute distress HEENT: normal  Lungs: clear to auscultation without wheezing, rales or rhonchi  Cardiac: regular rate without rubs, murmurs or gallops  Abdomen: soft, nontender without masses, guarding, rebound, organomegaly  Pelvic: external bus vagina: normal   Cervix: grossly normal  Uterus: normal size, midline and mobile, nontender  Adnexa: without masses or tenderness      Assessment/Plan:  66 y.o. G3P3 with known leiomyoma and past history of endometrial polyps resected almost 2 years ago currently asymptomatic other than some fleeting abdominal discomfort. Ultrasound shows several small endometrial defects suspicious for endometrial polyps and a larger submucous myoma. Options for management include observation versus intervention reviewed she is admitted for hysteroscopic evaluation and resection  of endometrial defects. I reviewed the proposed surgery with her to include the expected intraoperative postoperative courses use of the hysteroscope resectoscope, true clear technology and D&C portion. The risks of infection requiring prolonged antibiotics, hemorrhage necessitating transfusion and the risks of transfusion including transfusion reaction, hepatitis, HIV, mad cow disease and other unknown entities, uterine perforation, damage to internal organs either through perforation or transuterine thermal damage either immediately recognized or delay recognized, including bowel, bladder, ureters, vessels and nerves necessitating major exploratory reparative surgeries and future reparative surgeries including bowel resection, bladder repair, ureteral damage repair, ostomy formation was all discussed with her.  Distended media absorption with metabolic complications to include coma and seizures was also reviewed and she understands we may have to terminate the procedure during the case if there is excessive distended media absorption.  Lastly she clearly understands we are not removing all of her myomas but only those that we can safely address from a hysteroscopic standpoint and that she may have recurrence of her polyps or myomas in the future as evidenced by her prior hysteroscopy D&C 2 years ago and now with a recurrence. Patient's questions were answered to her satisfaction she is ready to proceed with surgery.    Dara Lords MD, 2:43 PM 03/25/2012

## 2012-03-25 NOTE — Pre-Procedure Instructions (Signed)
EKG reviewed by Dr Arby Barrette. Attempting to locate previous EKG per his request.

## 2012-03-25 NOTE — Patient Instructions (Addendum)
20 CAROLE DONER  03/25/2012   Your procedure is scheduled on:  03/29/12  Enter through the Main Entrance of [facility] at 6 AM.  Pick up the phone at the desk and dial [phone].   Call this number if you have problems the morning of surgery: [phone]   Remember:   Do not eat food:After Midnight.  Do not drink clear liquids: After Midnight.  Take these medicines the morning of surgery with A SIP OF WATER: Blood pressure medication and Thyroid medication   Do not wear jewelry, make-up or nail polish.  Do not wear lotions, powders, or perfumes. You may wear deodorant.  Do not shave 48 hours prior to surgery.  Do not bring valuables to the hospital.  Contacts, dentures or bridgework may not be worn into surgery.  Leave suitcase in the car. After surgery it may be brought to your room.  For patients admitted to the hospital, checkout time is 11:00 AM the day of discharge.   Patients discharged the day of surgery will not be allowed to drive home.  Name and phone number of your driver: daughter  Etta Quill  [facility]  Special Instructions: Shower using CHG 2 nights before surgery and the night before surgery.  If you shower the day of surgery use CHG.  Use special wash - you have one bottle of CHG for all showers.  You should use approximately 1/3 of the bottle for each shower.   Please read over the following fact sheets that you were given: surgical site infection

## 2012-03-25 NOTE — Progress Notes (Signed)
Shannon Grant 06-Apr-1945 [id number]   Preoperative consult  Chief complaint: leiomyoma/endometrial polyps.  History of present illness: 65 y.o. G3P3 history of leiomyoma submucous and endometrial polyps status post hysteroscopic resection 2012.  Had a recent evaluation by orthopedics where x-ray showed calcified leiomyoma and she was referred back for evaluation. Ultrasound with sonohysterogram showed several small endometrial defects consistent with endometrial polyps and a larger submucous myoma. Patient is otherwise asymptomatic as far as bleeding. She does have some fleeting abdominal discomfort.  Options for management include observation versus intervention reviewed and the patient's admitted for hysteroscopic evaluation and resection of endometrial defects.  Past medical history,surgical history, medications, allergies, family history and social history were all reviewed and documented in the EPIC chart. ROS:  Was performed and pertinent positives and negatives are included in the history of present illness.  Exam:  Higher education careers adviser General: well developed, well nourished female, no acute distress HEENT: normal  Lungs: clear to auscultation without wheezing, rales or rhonchi  Cardiac: regular rate without rubs, murmurs or gallops  Abdomen: soft, nontender without masses, guarding, rebound, organomegaly  Pelvic: external bus vagina: normal   Cervix: grossly normal  Uterus: normal size, midline and mobile, nontender  Adnexa: without masses or tenderness      Assessment/Plan:  66 y.o. G3P3 with known leiomyoma and past history of endometrial polyps resected almost 2 years ago currently asymptomatic other than some fleeting abdominal discomfort. Ultrasound shows several small endometrial defects suspicious for endometrial polyps and a larger submucous myoma. Options for management include observation versus intervention reviewed she is admitted for hysteroscopic evaluation and resection  of endometrial defects. I reviewed the proposed surgery with her to include the expected intraoperative postoperative courses use of the hysteroscope resectoscope, true clear technology and D&C portion. The risks of infection requiring prolonged antibiotics, hemorrhage necessitating transfusion and the risks of transfusion including transfusion reaction, hepatitis, HIV, mad cow disease and other unknown entities, uterine perforation, damage to internal organs either through perforation or transuterine thermal damage either immediately recognized or delay recognized, including bowel, bladder, ureters, vessels and nerves necessitating major exploratory reparative surgeries and future reparative surgeries including bowel resection, bladder repair, ureteral damage repair, ostomy formation was all discussed with her.  Distended media absorption with metabolic complications to include coma and seizures was also reviewed and she understands we may have to terminate the procedure during the case if there is excessive distended media absorption.  Lastly she clearly understands we are not removing all of her myomas but only those that we can safely address from a hysteroscopic standpoint and that she may have recurrence of her polyps or myomas in the future as evidenced by her prior hysteroscopy D&C 2 years ago and now with a recurrence. Patient's questions were answered to her satisfaction she is ready to proceed with surgery.   Dara Lords MD, 2:36 PM 03/25/2012

## 2012-03-25 NOTE — Telephone Encounter (Signed)
Pt never got rx, rx resent to pharmacy.

## 2012-03-28 MED ORDER — DEXTROSE 5 % IV SOLN
2.0000 g | INTRAVENOUS | Status: AC
  Administered 2012-03-29: 2 g via INTRAVENOUS
  Filled 2012-03-28: qty 2

## 2012-03-29 ENCOUNTER — Ambulatory Visit (HOSPITAL_COMMUNITY): Payer: BC Managed Care – PPO | Admitting: Anesthesiology

## 2012-03-29 ENCOUNTER — Encounter (HOSPITAL_COMMUNITY): Payer: Self-pay | Admitting: Anesthesiology

## 2012-03-29 ENCOUNTER — Encounter (HOSPITAL_COMMUNITY)
Admission: RE | Disposition: A | Discharge status: Home / Self Care | Payer: Self-pay | Source: Ambulatory Visit | Attending: Gynecology

## 2012-03-29 ENCOUNTER — Ambulatory Visit (HOSPITAL_COMMUNITY)
Admission: RE | Admit: 2012-03-29 | Discharge: 2012-03-29 | Disposition: A | Discharge status: Home / Self Care | Payer: BC Managed Care – PPO | Source: Ambulatory Visit | Attending: Gynecology | Admitting: Gynecology

## 2012-03-29 DIAGNOSIS — Z01818 Encounter for other preprocedural examination: Secondary | ICD-10-CM | POA: Insufficient documentation

## 2012-03-29 DIAGNOSIS — N840 Polyp of corpus uteri: Secondary | ICD-10-CM | POA: Insufficient documentation

## 2012-03-29 DIAGNOSIS — D250 Submucous leiomyoma of uterus: Secondary | ICD-10-CM | POA: Insufficient documentation

## 2012-03-29 DIAGNOSIS — D259 Leiomyoma of uterus, unspecified: Secondary | ICD-10-CM

## 2012-03-29 DIAGNOSIS — Z01812 Encounter for preprocedural laboratory examination: Secondary | ICD-10-CM | POA: Insufficient documentation

## 2012-03-29 SURGERY — DILATATION & CURETTAGE/HYSTEROSCOPY WITH TRUCLEAR
Anesthesia: General | Site: Uterus | Wound class: Clean Contaminated

## 2012-03-29 MED ORDER — LIDOCAINE HCL 1 % IJ SOLN
INTRAMUSCULAR | Status: DC | PRN
  Administered 2012-03-29: 10 mL

## 2012-03-29 MED ORDER — FENTANYL CITRATE 0.05 MG/ML IJ SOLN: 25.0000 ug | INTRAMUSCULAR | Status: DC | PRN

## 2012-03-29 MED ORDER — DEXAMETHASONE SODIUM PHOSPHATE 4 MG/ML IJ SOLN
INTRAMUSCULAR | Status: DC | PRN
  Administered 2012-03-29: 8 mg via INTRAVENOUS

## 2012-03-29 MED ORDER — KETOROLAC TROMETHAMINE 30 MG/ML IJ SOLN
INTRAMUSCULAR | Status: AC
  Filled 2012-03-29: qty 1

## 2012-03-29 MED ORDER — FENTANYL CITRATE 0.05 MG/ML IJ SOLN
INTRAMUSCULAR | Status: DC | PRN
  Administered 2012-03-29 (×2): 50 ug via INTRAVENOUS
  Administered 2012-03-29: 100 ug via INTRAVENOUS

## 2012-03-29 MED ORDER — LACTATED RINGERS IV SOLN
INTRAVENOUS | Status: DC
  Administered 2012-03-29: 07:00:00 via INTRAVENOUS

## 2012-03-29 MED ORDER — MIDAZOLAM HCL 2 MG/2ML IJ SOLN: 0.5000 mg | Freq: Once | INTRAMUSCULAR | Status: DC | PRN

## 2012-03-29 MED ORDER — PROPOFOL 10 MG/ML IV EMUL
INTRAVENOUS | Status: DC | PRN
  Administered 2012-03-29: 200 mg via INTRAVENOUS

## 2012-03-29 MED ORDER — PROPOFOL 10 MG/ML IV EMUL
INTRAVENOUS | Status: AC
  Filled 2012-03-29: qty 20

## 2012-03-29 MED ORDER — KETOROLAC TROMETHAMINE 30 MG/ML IJ SOLN: 15.0000 mg | Freq: Once | INTRAMUSCULAR | Status: DC | PRN

## 2012-03-29 MED ORDER — MIDAZOLAM HCL 5 MG/5ML IJ SOLN
INTRAMUSCULAR | Status: DC | PRN
  Administered 2012-03-29: 2 mg via INTRAVENOUS

## 2012-03-29 MED ORDER — MIDAZOLAM HCL 2 MG/2ML IJ SOLN
INTRAMUSCULAR | Status: AC
  Filled 2012-03-29: qty 2

## 2012-03-29 MED ORDER — PROMETHAZINE HCL 25 MG/ML IJ SOLN: 6.2500 mg | INTRAMUSCULAR | Status: DC | PRN

## 2012-03-29 MED ORDER — DEXTROSE-NACL 5-0.9 % IV SOLN: INTRAVENOUS | Status: DC

## 2012-03-29 MED ORDER — HYDROCODONE-ACETAMINOPHEN 5-500 MG PO TABS: 1.0000 | ORAL_TABLET | Freq: Four times a day (QID) | ORAL | Status: DC | PRN

## 2012-03-29 MED ORDER — LIDOCAINE HCL (CARDIAC) 20 MG/ML IV SOLN
INTRAVENOUS | Status: DC | PRN
  Administered 2012-03-29: 30 mg via INTRAVENOUS

## 2012-03-29 MED ORDER — FENTANYL CITRATE 0.05 MG/ML IJ SOLN
INTRAMUSCULAR | Status: AC
  Filled 2012-03-29: qty 5

## 2012-03-29 MED ORDER — KETOROLAC TROMETHAMINE 30 MG/ML IJ SOLN
INTRAMUSCULAR | Status: DC | PRN
  Administered 2012-03-29: 30 mg via INTRAVENOUS

## 2012-03-29 MED ORDER — ONDANSETRON HCL 4 MG/2ML IJ SOLN
INTRAMUSCULAR | Status: AC
  Filled 2012-03-29: qty 2

## 2012-03-29 MED ORDER — ONDANSETRON HCL 4 MG/2ML IJ SOLN
INTRAMUSCULAR | Status: DC | PRN
  Administered 2012-03-29: 4 mg via INTRAVENOUS

## 2012-03-29 MED ORDER — MEPERIDINE HCL 25 MG/ML IJ SOLN: 6.2500 mg | INTRAMUSCULAR | Status: DC | PRN

## 2012-03-29 MED ORDER — LIDOCAINE HCL (CARDIAC) 20 MG/ML IV SOLN
INTRAVENOUS | Status: AC
  Filled 2012-03-29: qty 5

## 2012-03-29 MED ORDER — DEXAMETHASONE SODIUM PHOSPHATE 10 MG/ML IJ SOLN
INTRAMUSCULAR | Status: AC
  Filled 2012-03-29: qty 1

## 2012-03-29 MED ORDER — SODIUM CHLORIDE 0.9 % IR SOLN
Status: DC | PRN
  Administered 2012-03-29: 3000 mL

## 2012-03-29 SURGICAL SUPPLY — 25 items
BLADE INCISOR TRUC PLUS 2.9 (ABLATOR) IMPLANT
CANISTERS HI-FLOW 3000CC (CANNISTER) ×3 IMPLANT
CATH ROBINSON RED A/P 16FR (CATHETERS) ×2 IMPLANT
CLOTH BEACON ORANGE TIMEOUT ST (SAFETY) ×2 IMPLANT
CONTAINER PREFILL 10% NBF 60ML (FORM) ×4 IMPLANT
DRAPE HYSTEROSCOPY (DRAPE) ×2 IMPLANT
DRESSING TELFA 8X3 (GAUZE/BANDAGES/DRESSINGS) ×2 IMPLANT
ELECT REM PT RETURN 9FT ADLT (ELECTROSURGICAL)
ELECTRODE REM PT RTRN 9FT ADLT (ELECTROSURGICAL) IMPLANT
GLOVE BIO SURGEON STRL SZ7.5 (GLOVE) ×4 IMPLANT
GLOVE BIOGEL PI IND STRL 7.0 (GLOVE) IMPLANT
GLOVE BIOGEL PI INDICATOR 7.0 (GLOVE) ×1
GLOVE NEODERM STER SZ 7 (GLOVE) ×1 IMPLANT
GOWN STRL REIN XL XLG (GOWN DISPOSABLE) ×5 IMPLANT
INCISOR TRUC PLUS BLADE 2.9 (ABLATOR)
KIT HYSTEROSCOPY TRUCLEAR (ABLATOR) ×1 IMPLANT
MORCELLATOR RECIP TRUCLEAR 4.0 (ABLATOR) ×1 IMPLANT
MORCELLATOR ROTARY HYSTERO (ABLATOR) ×1 IMPLANT
NDL SPNL 22GX3.5 QUINCKE BK (NEEDLE) IMPLANT
NEEDLE SPNL 22GX3.5 QUINCKE BK (NEEDLE) ×2 IMPLANT
PACK VAGINAL MINOR WOMEN LF (CUSTOM PROCEDURE TRAY) ×2 IMPLANT
PAD OB MATERNITY 4.3X12.25 (PERSONAL CARE ITEMS) ×2 IMPLANT
SYR CONTROL 10ML LL (SYRINGE) ×2 IMPLANT
TOWEL OR 17X24 6PK STRL BLUE (TOWEL DISPOSABLE) ×4 IMPLANT
WATER STERILE IRR 1000ML POUR (IV SOLUTION) ×2 IMPLANT

## 2012-03-29 NOTE — Op Note (Signed)
Shannon Grant Aug 13, 1945 [id number]   Post Operative Note   Date of surgery:  03/29/2012  Pre Op Dx:  Leiomyoma, endometrial polyp  Post Op Dx:  Submucous leiomyoma, endometrial polyp  Procedure:  Truclear hysteroscopic submucous myomectomy, polypectomy, D&C  Surgeon:  Dara Lords  Anesthesia:  General  EBL:  minimal  Distended media discrepancy:  1100 cc saline  Complications:  None  Specimen:  #1 endometrial polyp #2 submucous myoma fragments #3 endometrial curetting to pathology  Findings: EUA:  External BUS vagina with atrophic changes. Cervix normal. Uterus retroverted grossly normal in size with some irregularity to suggest subserosal leiomyoma. Adnexa without masses   Hysteroscopic:  Endometrial polyp posterior mid endometrial surface. Submucous myoma right posterior mid endometrial surface. Resection approximately 2/3 myoma volume. Hysteroscopy otherwise shows atrophic endometrial pattern with right and left tubal ostia, fundus, anterior posterior uterine surfaces, lower uterine segment and endocervical canal all visualized.  Procedure:  The patient was taken to the operating room, was placed in the low dorsal lithotomy position while awake, underwent general anesthesia and time out was performed by the surgical team. The patient received a perineal/vaginal preparation with Betadine solution, the bladder empty with an in and out Foley catheterization and an EUA was performed.  Patient was draped in the usual fashion and cervix was visualized with a speculum, anterior lip grasped with a single-tooth tenaculum and a paracervical block using 1% lidocaine was placed, a total of 10 cc. The cervix was then gently and gradually dilated to a #27 [place] dilator and the Truclear hysteroscopic sheath was placed with obturator without difficulty. The obturator was removed and the hysteroscope was placed with hysteroscopy performed with findings noted above. Using the rotary morcellator  the polyp was removed to the level of the surrounding endometrium and was sent to pathology as a separate specimen. Using the reciprocating morcellator the myoma was removed in sequential passes to the level of the surrounding endometrium. The hysteroscopic pressure was allowed to drop several times during the procedure to allow for any remaining myoma to protrude into the cavity. At the end of the resection it was estimated that two thirds of the myoma was removed with some residual myoma within the myometrial wall flush with the surrounding endometrium. The myoma specimen was then sent to pathology.  A sharp curettage was then performed and the specimen was sent separately.  The tenaculum was removed and hemostasis was visualized both at the tenaculum site and the external cervical os. The patient was then placed in the supine position, awakened without difficulty, received intraoperative Toradol and was taken to the recovery room in good condition having tolerated the procedure well.   Dara Lords MD, 11:46 AM 03/29/2012

## 2012-03-29 NOTE — Transfer of Care (Signed)
Immediate Anesthesia Transfer of Care Note  Patient: Shannon Grant  Procedure(s) Performed: Procedure(s) (LRB) with comments: DILATATION & CURETTAGE/HYSTEROSCOPY WITH TRUCLEAR (N/A)  Patient Location: PACU  Anesthesia Type:General  Level of Consciousness: awake, alert , oriented and patient cooperative  Airway & Oxygen Therapy: Patient Spontanous Breathing and Patient connected to nasal cannula oxygen  Post-op Assessment: Report given to PACU RN, Post -op Vital signs reviewed and stable and Patient moving all extremities  Post vital signs: Reviewed and stable  Complications: No apparent anesthesia complications

## 2012-03-29 NOTE — H&P (Signed)
  The patient was examined.  I reviewed the proposed surgery and consent form with the patient.  The dictated history and physical is current and accurate and all questions were answered. The patient is ready to proceed with surgery and has a realistic understanding and expectation for the outcome.   Dara Lords MD, 7:08 AM 03/29/2012

## 2012-03-29 NOTE — Anesthesia Preprocedure Evaluation (Signed)
Anesthesia Evaluation  Patient identified by MRN, date of birth, ID band Patient awake    Reviewed: Allergy & Precautions, H&P , Patient's Chart, lab work & pertinent test results, reviewed documented beta blocker date and time   History of Anesthesia Complications Negative for: history of anesthetic complications  Airway Mallampati: II TM Distance: >3 FB Neck ROM: full    Dental No notable dental hx.    Pulmonary neg pulmonary ROS,  breath sounds clear to auscultation  Pulmonary exam normal       Cardiovascular Exercise Tolerance: Good hypertension, negative cardio ROS  Rhythm:regular Rate:Normal     Neuro/Psych negative neurological ROS  negative psych ROS   GI/Hepatic negative GI ROS, Neg liver ROS,   Endo/Other  negative endocrine ROS  Renal/GU negative Renal ROS     Musculoskeletal   Abdominal   Peds  Hematology negative hematology ROS (+)   Anesthesia Other Findings  Hypertension     Thyroid disease   hypothyroid    High cholesterol     Fibroid    Reproductive/Obstetrics negative OB ROS                           Anesthesia Physical Anesthesia Plan  ASA: II  Anesthesia Plan: General LMA   Post-op Pain Management:    Induction:   Airway Management Planned:   Additional Equipment:   Intra-op Plan:   Post-operative Plan:   Informed Consent: I have reviewed the patients History and Physical, chart, labs and discussed the procedure including the risks, benefits and alternatives for the proposed anesthesia with the patient or authorized representative who has indicated his/her understanding and acceptance.   Dental Advisory Given  Plan Discussed with: CRNA, Surgeon and Anesthesiologist  Anesthesia Plan Comments:         Anesthesia Quick Evaluation

## 2012-03-29 NOTE — Anesthesia Postprocedure Evaluation (Signed)
  Anesthesia Post-op Note  Patient: Shannon Grant  Procedure(s) Performed: Procedure(s) (LRB) with comments: DILATATION & CURETTAGE/HYSTEROSCOPY WITH TRUCLEAR (N/A)  Patient Location: PACU  Anesthesia Type:General  Level of Consciousness: awake, alert  and oriented  Airway and Oxygen Therapy: Patient Spontanous Breathing  Post-op Pain: none  Post-op Assessment: Post-op Vital signs reviewed, Patient's Cardiovascular Status Stable, Respiratory Function Stable, Patent Airway, No signs of Nausea or vomiting and Pain level controlled  Post-op Vital Signs: Reviewed and stable  Complications: No apparent anesthesia complications

## 2012-03-31 DIAGNOSIS — Z9221 Personal history of antineoplastic chemotherapy: Secondary | ICD-10-CM

## 2012-03-31 DIAGNOSIS — Z923 Personal history of irradiation: Secondary | ICD-10-CM

## 2012-03-31 HISTORY — DX: Personal history of irradiation: Z92.3

## 2012-03-31 HISTORY — DX: Personal history of antineoplastic chemotherapy: Z92.21

## 2012-03-31 HISTORY — PX: BREAST LUMPECTOMY: SHX2

## 2012-04-19 ENCOUNTER — Ambulatory Visit (INDEPENDENT_AMBULATORY_CARE_PROVIDER_SITE_OTHER): Payer: BC Managed Care – PPO | Admitting: Gynecology

## 2012-04-19 ENCOUNTER — Encounter: Payer: Self-pay | Admitting: Gynecology

## 2012-04-19 DIAGNOSIS — Z09 Encounter for follow-up examination after completed treatment for conditions other than malignant neoplasm: Secondary | ICD-10-CM

## 2012-04-19 NOTE — Patient Instructions (Signed)
Follow up routinely. Call if any bleeding or other issues.

## 2012-04-19 NOTE — Progress Notes (Signed)
Postoperative checkup status post hysteroscopic myomectomy and removal of endometrial polyp. Patient's done well without complaints.  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 Cervix normal. Uterus normal size midline mobile nontender. Adnexa without masses or tenderness.  Assessment and plan: Hysteroscopic myomectomy and removal of polyp. Reviewed pathology which showed benign endometrial polyp and benign leiomyoma. I reviewed findings of surgery with her to include that not all of the myoma was resected as I could only go to the level of the surrounding endometrium. She will keep a [facility] for further bleeding and is doing well then we will follow. I did review her EKG with questionable old infarct and my recommendation is was to follow up with Dr. Clyde Canterbury for evaluation as well as further discussion about her cholesterol management as she had questions in reference to this and I referred her back to Dr. Clyde Canterbury who is managing her hypercholesterolemia.

## 2012-12-24 DIAGNOSIS — M76899 Other specified enthesopathies of unspecified lower limb, excluding foot: Secondary | ICD-10-CM | POA: Diagnosis not present

## 2012-12-24 DIAGNOSIS — M1710 Unilateral primary osteoarthritis, unspecified knee: Secondary | ICD-10-CM | POA: Diagnosis not present

## 2013-01-20 DIAGNOSIS — N63 Unspecified lump in unspecified breast: Secondary | ICD-10-CM | POA: Diagnosis not present

## 2013-01-21 DIAGNOSIS — I219 Acute myocardial infarction, unspecified: Secondary | ICD-10-CM | POA: Diagnosis not present

## 2013-01-21 DIAGNOSIS — I10 Essential (primary) hypertension: Secondary | ICD-10-CM | POA: Diagnosis not present

## 2013-01-21 DIAGNOSIS — E039 Hypothyroidism, unspecified: Secondary | ICD-10-CM | POA: Diagnosis not present

## 2013-01-21 DIAGNOSIS — N63 Unspecified lump in unspecified breast: Secondary | ICD-10-CM | POA: Diagnosis not present

## 2013-01-21 DIAGNOSIS — Z23 Encounter for immunization: Secondary | ICD-10-CM | POA: Diagnosis not present

## 2013-01-21 DIAGNOSIS — Z0000 Encounter for general adult medical examination without abnormal findings: Secondary | ICD-10-CM | POA: Diagnosis not present

## 2013-01-21 DIAGNOSIS — E7800 Pure hypercholesterolemia, unspecified: Secondary | ICD-10-CM | POA: Diagnosis not present

## 2013-01-24 ENCOUNTER — Other Ambulatory Visit: Payer: Self-pay | Admitting: Radiology

## 2013-01-24 DIAGNOSIS — C50919 Malignant neoplasm of unspecified site of unspecified female breast: Secondary | ICD-10-CM | POA: Diagnosis not present

## 2013-01-24 HISTORY — DX: Malignant neoplasm of unspecified site of unspecified female breast: C50.919

## 2013-01-25 ENCOUNTER — Other Ambulatory Visit: Payer: Self-pay | Admitting: Radiology

## 2013-01-25 DIAGNOSIS — C50911 Malignant neoplasm of unspecified site of right female breast: Secondary | ICD-10-CM

## 2013-01-26 ENCOUNTER — Telehealth: Payer: Self-pay | Admitting: *Deleted

## 2013-01-26 DIAGNOSIS — C50411 Malignant neoplasm of upper-outer quadrant of right female breast: Secondary | ICD-10-CM

## 2013-01-26 NOTE — Telephone Encounter (Signed)
Confirmed [facility] for 02/01/13 at 0800.  Instructions and contact information given.

## 2013-01-27 ENCOUNTER — Encounter: Payer: Self-pay | Admitting: Gynecology

## 2013-01-31 ENCOUNTER — Ambulatory Visit
Admission: RE | Admit: 2013-01-31 | Discharge: 2013-01-31 | Disposition: A | Discharge status: Home / Self Care | Payer: Medicare Other | Source: Ambulatory Visit | Attending: Radiology | Admitting: Radiology

## 2013-01-31 DIAGNOSIS — C50911 Malignant neoplasm of unspecified site of right female breast: Secondary | ICD-10-CM

## 2013-01-31 MED ORDER — GADOBENATE DIMEGLUMINE 529 MG/ML IV SOLN
16.0000 mL | Freq: Once | INTRAVENOUS | Status: AC | PRN
  Administered 2013-01-31: 16 mL via INTRAVENOUS

## 2013-02-01 ENCOUNTER — Telehealth: Payer: Self-pay | Admitting: *Deleted

## 2013-02-01 DIAGNOSIS — M169 Osteoarthritis of hip, unspecified: Secondary | ICD-10-CM | POA: Diagnosis not present

## 2013-02-01 NOTE — Telephone Encounter (Signed)
Pt would like to come to both [facility] appt and to see Dr. Welton Flakes.  She would like to see the difference in opinions.  She is scheduled to see Dr. Welton Flakes on 02/03/13 at 1:00 per Dr. Welton Flakes.  Confirmed appt date and time.  Pt denies further needs.

## 2013-02-02 ENCOUNTER — Ambulatory Visit: Payer: BC Managed Care – PPO

## 2013-02-02 ENCOUNTER — Encounter: Payer: Self-pay | Admitting: *Deleted

## 2013-02-02 ENCOUNTER — Ambulatory Visit
Admission: RE | Admit: 2013-02-02 | Discharge: 2013-02-02 | Disposition: A | Discharge status: Home / Self Care | Payer: BC Managed Care – PPO | Source: Ambulatory Visit | Attending: Radiation Oncology | Admitting: Radiation Oncology

## 2013-02-02 ENCOUNTER — Ambulatory Visit (HOSPITAL_BASED_OUTPATIENT_CLINIC_OR_DEPARTMENT_OTHER): Payer: Medicare Other | Admitting: Oncology

## 2013-02-02 ENCOUNTER — Telehealth: Payer: Self-pay | Admitting: Oncology

## 2013-02-02 ENCOUNTER — Ambulatory Visit: Payer: Medicare Other | Attending: General Surgery | Admitting: Physical Therapy

## 2013-02-02 ENCOUNTER — Encounter (INDEPENDENT_AMBULATORY_CARE_PROVIDER_SITE_OTHER): Payer: Self-pay | Admitting: General Surgery

## 2013-02-02 ENCOUNTER — Ambulatory Visit (HOSPITAL_BASED_OUTPATIENT_CLINIC_OR_DEPARTMENT_OTHER): Payer: Medicare Other | Admitting: General Surgery

## 2013-02-02 ENCOUNTER — Encounter: Payer: Self-pay | Admitting: Oncology

## 2013-02-02 ENCOUNTER — Other Ambulatory Visit (HOSPITAL_BASED_OUTPATIENT_CLINIC_OR_DEPARTMENT_OTHER): Payer: Medicare Other | Admitting: Lab

## 2013-02-02 VITALS — BP 173/75 | HR 66 | Temp 98.4°F | Resp 18 | Ht 65.0 in | Wt 176.3 lb

## 2013-02-02 DIAGNOSIS — M25559 Pain in unspecified hip: Secondary | ICD-10-CM | POA: Insufficient documentation

## 2013-02-02 DIAGNOSIS — C50419 Malignant neoplasm of upper-outer quadrant of unspecified female breast: Secondary | ICD-10-CM

## 2013-02-02 DIAGNOSIS — C50411 Malignant neoplasm of upper-outer quadrant of right female breast: Secondary | ICD-10-CM

## 2013-02-02 DIAGNOSIS — C50919 Malignant neoplasm of unspecified site of unspecified female breast: Secondary | ICD-10-CM | POA: Insufficient documentation

## 2013-02-02 DIAGNOSIS — IMO0001 Reserved for inherently not codable concepts without codable children: Secondary | ICD-10-CM | POA: Diagnosis not present

## 2013-02-02 DIAGNOSIS — R269 Unspecified abnormalities of gait and mobility: Secondary | ICD-10-CM | POA: Diagnosis not present

## 2013-02-02 LAB — CBC WITH DIFFERENTIAL/PLATELET
BASO%: 1 % (ref 0.0–2.0)
EOS%: 2.1 % (ref 0.0–7.0)
HCT: 43.7 % (ref 34.8–46.6)
LYMPH%: 15.8 % (ref 14.0–49.7)
MCH: 29.4 pg (ref 25.1–34.0)
MCHC: 33.5 g/dL (ref 31.5–36.0)
MONO#: 0.6 10*3/uL (ref 0.1–0.9)
NEUT%: 71.5 % (ref 38.4–76.8)
RBC: 4.99 10*6/uL (ref 3.70–5.45)
WBC: 6.5 10*3/uL (ref 3.9–10.3)
lymph#: 1 10*3/uL (ref 0.9–3.3)

## 2013-02-02 LAB — COMPREHENSIVE METABOLIC PANEL (CC13)
ALT: 15 U/L (ref 0–55)
AST: 20 U/L (ref 5–34)
CO2: 27 mEq/L (ref 22–29)
Chloride: 101 mEq/L (ref 98–109)
Creatinine: 0.7 mg/dL (ref 0.6–1.1)
Sodium: 140 mEq/L (ref 136–145)
Total Bilirubin: 0.56 mg/dL (ref 0.20–1.20)
Total Protein: 7.2 g/dL (ref 6.4–8.3)
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
## 2013-02-02 NOTE — Telephone Encounter (Signed)
, °
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
## 2013-02-02 NOTE — Progress Notes (Signed)
Checked in new patient with no financial issue.she has appt card and [facility] forms. She has not traveled to [place].
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
## 2013-02-03 ENCOUNTER — Ambulatory Visit (HOSPITAL_BASED_OUTPATIENT_CLINIC_OR_DEPARTMENT_OTHER): Payer: Medicare Other | Admitting: Oncology

## 2013-02-03 ENCOUNTER — Other Ambulatory Visit: Payer: Self-pay | Admitting: *Deleted

## 2013-02-03 VITALS — BP 168/90 | HR 69 | Temp 98.6°F | Resp 20 | Ht 65.0 in | Wt 176.4 lb

## 2013-02-03 DIAGNOSIS — Z171 Estrogen receptor negative status [ER-]: Secondary | ICD-10-CM

## 2013-02-03 DIAGNOSIS — K7689 Other specified diseases of liver: Secondary | ICD-10-CM | POA: Diagnosis not present

## 2013-02-03 DIAGNOSIS — C50419 Malignant neoplasm of upper-outer quadrant of unspecified female breast: Secondary | ICD-10-CM

## 2013-02-03 DIAGNOSIS — C50411 Malignant neoplasm of upper-outer quadrant of right female breast: Secondary | ICD-10-CM
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
## 2013-02-04 ENCOUNTER — Telehealth: Payer: Self-pay | Admitting: *Deleted

## 2013-02-04 NOTE — Telephone Encounter (Signed)
Pt called stating that she has decided that she would like to keep her care w/ Dr. Darnelle Catalan.  Asked if I would tell Dr. Welton Flakes Thank You and I informed her that I would.  She asked if I could let everyone know her decision and I told her that I would.  She wanted to know about the appt for Dr. Delton See and after speaking with Dr. Darnelle Catalan, he stated that a cardiac clearance would be needed before the surgery could be made.  She asked if I would let Dr. Carolynne Edouard know that she had the appt on Monday and wanted to have her surgery and I informed her yes, that I would email him and his nurse. She was in question about her lab appt before she saw Dr. Darnelle Catalan again and so I scheduled that for her.  She also was in question about the Echo appt and I informed her that I would have to sent that to one of the schedulers to handle.     Emailed Dr. Welton Flakes to make her aware that pt has decided to stay with Dr. Darnelle Catalan and the pt Thanks her and for her time. Emailed Dr. Carolynne Edouard & Marcelino Duster at [facility] to let them know that pt has decided to stay with Dr. Darnelle Catalan and that she has the cardiac appt on Monday and would like to be scheduled for surgery asap after she received clearance. Scheduled lab appt for pt and mailed new calendar - per pts request. Emailed Ann in Scheduling to ask her about the Echo appt. Emailed Dr. Dayton Scrape & Dawn to make them aware of all that's going on.

## 2013-02-07 ENCOUNTER — Encounter: Payer: Self-pay | Admitting: Cardiology

## 2013-02-07 ENCOUNTER — Encounter: Payer: Self-pay | Admitting: *Deleted

## 2013-02-07 ENCOUNTER — Ambulatory Visit (INDEPENDENT_AMBULATORY_CARE_PROVIDER_SITE_OTHER): Payer: Medicare Other | Admitting: Cardiology

## 2013-02-07 VITALS — BP 144/98 | HR 61 | Ht 65.0 in | Wt 176.0 lb

## 2013-02-07 DIAGNOSIS — R9431 Abnormal electrocardiogram [ECG] [EKG]: Secondary | ICD-10-CM

## 2013-02-07 MED ORDER — METOPROLOL TARTRATE 25 MG PO TABS: 12.5000 mg | ORAL_TABLET | Freq: Two times a day (BID) | ORAL | Status: DC
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
## 2013-02-07 NOTE — Patient Instructions (Signed)
Your physician recommends that you schedule a follow-up appointment in:  1 month with Dr. Delton See  Your physician has requested that you have an echocardiogram. Echocardiography is a painless test that uses sound waves to create images of your heart. It provides your doctor with information about the size and shape of your heart and how well your heart's chambers and valves are working. This procedure takes approximately one hour. There are no restrictions for this procedure.   Your physician has recommended you make the following change in your medication: Start lopressor 12.5 mg by mouth twice daily

## 2013-02-07 NOTE — Progress Notes (Signed)
[facility] Psychosocial Distress Screening Clinical Social Work  Patient completed distress screening protocol.  The patient scored a 7 on the Psychosocial Distress Thermometer which indicates moderate distress. Clinical Social Worker met with pt in [facility] to assess for distress and other psychosocial needs.  Pt stated she was "much better" after meeting with the physicians and having a "road map" for her care.  CSW informed pt of the support team and support services at [facility].  Pt was appreciative of the information, but declined an alight guides referral at this time.  CSW encouraged pt to call with any additional questions or concerns.     Tamala Julian, MSW, LCSW Clinical Social Worker [facility] [phone]

## 2013-02-08 ENCOUNTER — Telehealth: Payer: Self-pay | Admitting: *Deleted

## 2013-02-08 ENCOUNTER — Encounter: Payer: Self-pay | Admitting: *Deleted

## 2013-02-08 NOTE — Telephone Encounter (Signed)
Spoke to pt concerning [facility] from 02/02/13.  Pt denies questions or concerns regarding dx or treatment care plan. Confirmed 2nd opinion with Dr. Welton Flakes today.  Encourage pt to call with needs.  Received verbal understanding.  Contact information given.

## 2013-02-09 ENCOUNTER — Ambulatory Visit
Admission: RE | Admit: 2013-02-09 | Discharge: 2013-02-09 | Disposition: A | Discharge status: Home / Self Care | Payer: Medicare Other | Source: Ambulatory Visit | Attending: Oncology | Admitting: Oncology

## 2013-02-09 DIAGNOSIS — C50919 Malignant neoplasm of unspecified site of unspecified female breast: Secondary | ICD-10-CM | POA: Diagnosis not present

## 2013-02-09 DIAGNOSIS — K7689 Other specified diseases of liver: Secondary | ICD-10-CM | POA: Diagnosis not present

## 2013-02-09 MED ORDER — GADOXETATE DISODIUM 0.25 MMOL/ML IV SOLN
8.0000 mL | Freq: Once | INTRAVENOUS | Status: AC | PRN
  Administered 2013-02-09: 8 mL via INTRAVENOUS

## 2013-02-10 ENCOUNTER — Telehealth: Payer: Self-pay | Admitting: Oncology

## 2013-02-10 NOTE — Telephone Encounter (Signed)
S/w the pt and she is aware of her r/s dec 1st appt that has been moved to 03/17/2013 due to her surgery date. Pt is aware of her chemo educ class in dec.

## 2013-02-11 ENCOUNTER — Telehealth: Payer: Self-pay | Admitting: *Deleted

## 2013-02-11 NOTE — Telephone Encounter (Signed)
Pt called wanting to obtain her MRI results.  Informed her that I was not a nurse, however, I would get the information over to Amy, Dr. Darrall Dears nurse today and have her to check into it and call her back.

## 2013-02-14 ENCOUNTER — Encounter: Payer: Self-pay | Admitting: Oncology

## 2013-02-14 ENCOUNTER — Telehealth: Payer: Self-pay | Admitting: *Deleted

## 2013-02-14 ENCOUNTER — Encounter: Payer: Self-pay | Admitting: *Deleted

## 2013-02-14 NOTE — Progress Notes (Signed)
Mailed after appt letter to pt. 

## 2013-02-14 NOTE — Telephone Encounter (Signed)
Patient notified that MRI-liver results show no areas of concern of metastatic disease. Areas in liver appear to be cysts and we will continue to monitor this. Patient relieved of notification of these results.

## 2013-02-14 NOTE — Telephone Encounter (Signed)
error 

## 2013-02-16 ENCOUNTER — Encounter (HOSPITAL_COMMUNITY): Payer: Self-pay | Admitting: Pharmacy Technician

## 2013-02-18 ENCOUNTER — Ambulatory Visit (HOSPITAL_COMMUNITY): Payer: Medicare Other | Attending: Cardiology | Admitting: Cardiology

## 2013-02-18 ENCOUNTER — Encounter: Payer: Self-pay | Admitting: Cardiology

## 2013-02-18 ENCOUNTER — Encounter: Payer: Self-pay | Admitting: Family Medicine

## 2013-02-18 DIAGNOSIS — I079 Rheumatic tricuspid valve disease, unspecified: Secondary | ICD-10-CM | POA: Diagnosis not present

## 2013-02-18 DIAGNOSIS — I059 Rheumatic mitral valve disease, unspecified: Secondary | ICD-10-CM | POA: Diagnosis not present

## 2013-02-18 DIAGNOSIS — E785 Hyperlipidemia, unspecified: Secondary | ICD-10-CM | POA: Diagnosis not present

## 2013-02-18 DIAGNOSIS — I10 Essential (primary) hypertension: Secondary | ICD-10-CM | POA: Insufficient documentation

## 2013-02-18 DIAGNOSIS — I252 Old myocardial infarction: Secondary | ICD-10-CM | POA: Insufficient documentation

## 2013-02-18 DIAGNOSIS — R9431 Abnormal electrocardiogram [ECG] [EKG]: Secondary | ICD-10-CM | POA: Diagnosis present

## 2013-02-18 DIAGNOSIS — Z87891 Personal history of nicotine dependence: Secondary | ICD-10-CM | POA: Diagnosis not present

## 2013-02-18 NOTE — Progress Notes (Signed)
Echo performed. 

## 2013-02-21 NOTE — Pre-Procedure Instructions (Signed)
Shannon Grant  02/21/2013   Your procedure is scheduled on:  Thursday, Dec. 4th   Report to [facility] (Entrance A) at  8:30 AM.             [facility] Parking is available)  Call this number if you have problems the morning of surgery: [phone]   Remember:   Do not eat food or drink liquids after midnight Wednesday.   Take these medicines the morning of surgery with A SIP OF WATER: Metoprolol, Levothyroxine   Do not wear jewelry, make-up or nail polish.  Do not wear lotions, powders, or perfumes. You may wear deodorant.  Do not shave underarms & legs 48 hours prior to surgery.   Do not bring valuables to the hospital.  [facility] is not responsible for any belongings or valuables.               Contacts, dentures or bridgework may not be worn into surgery.  Leave suitcase in the car. After surgery it may be brought to your room.                Patients discharged the day of surgery will not be allowed to drive home.   Name and phone number of your driver:    Special Instructions: Shower using CHG 2 nights before surgery and the night before surgery.  If you shower the day of surgery use CHG.  Use special wash - you have one bottle of CHG for all showers.  You should use approximately 1/3 of the bottle for each shower.   Please read over the following fact sheets that you were given: Pain Booklet and Surgical Site Infection Prevention

## 2013-02-22 ENCOUNTER — Telehealth: Payer: Self-pay | Admitting: Cardiology

## 2013-02-22 ENCOUNTER — Encounter (HOSPITAL_COMMUNITY)
Admission: RE | Admit: 2013-02-22 | Discharge: 2013-02-22 | Disposition: A | Discharge status: Home / Self Care | Payer: Medicare Other | Source: Ambulatory Visit | Attending: General Surgery | Admitting: General Surgery

## 2013-02-22 ENCOUNTER — Telehealth: Payer: Self-pay

## 2013-02-22 DIAGNOSIS — Z01812 Encounter for preprocedural laboratory examination: Secondary | ICD-10-CM | POA: Diagnosis not present

## 2013-02-22 DIAGNOSIS — Z01818 Encounter for other preprocedural examination: Secondary | ICD-10-CM | POA: Diagnosis not present

## 2013-02-22 LAB — CBC
HCT: 44.6 % (ref 36.0–46.0)
Hemoglobin: 15.1 g/dL — ABNORMAL HIGH (ref 12.0–15.0)
MCH: 29.7 pg (ref 26.0–34.0)
MCHC: 33.9 g/dL (ref 30.0–36.0)
MCV: 87.8 fL (ref 78.0–100.0)
WBC: 6.3 10*3/uL (ref 4.0–10.5)

## 2013-02-22 LAB — BASIC METABOLIC PANEL
BUN: 9 mg/dL (ref 6–23)
GFR calc non Af Amer: 88 mL/min — ABNORMAL LOW (ref >90)
Glucose, Bld: 92 mg/dL (ref 70–99)
Potassium: 3.7 mEq/L (ref 3.5–5.1)
Sodium: 140 mEq/L (ref 135–145)

## 2013-02-22 MED ORDER — CHLORHEXIDINE GLUCONATE 4 % EX LIQD: 1.0000 "application " | Freq: Once | CUTANEOUS | Status: DC

## 2013-02-22 NOTE — Telephone Encounter (Signed)
Follow Up  ° °Pt returned call for ECHO results  °

## 2013-02-22 NOTE — Telephone Encounter (Signed)
I called pt to give her her Echo results. The pt states that she has not picked up her RX for lopressor 12.5 mg to be taken BID that was prescribed to her by Dr Delton See at her last OV on 11/10  because she states she has been busy planning for her lumpectomy that is scheduled with Dr Carolynne Edouard on 12/8. She states that she had her pre-op appt today and her BP was 140/70 and now she wants to know if she still needs to take Lopressor. Please advise.

## 2013-02-23 DIAGNOSIS — M169 Osteoarthritis of hip, unspecified: Secondary | ICD-10-CM | POA: Diagnosis not present

## 2013-02-23 NOTE — Telephone Encounter (Signed)
Yes, she should take her lopressor. Thank you, Aris Lot

## 2013-02-23 NOTE — Telephone Encounter (Signed)
Pt given results, she verbalized understanding. 

## 2013-02-23 NOTE — Telephone Encounter (Signed)
**Note De-Identified  Obfuscation** Per Dr Delton See it is okay for pt to wait until after her procedure on 12/4 to start taking Lopressor. The pt verbalized understanding.

## 2013-02-28 ENCOUNTER — Other Ambulatory Visit: Payer: Medicare Other | Admitting: Lab

## 2013-02-28 ENCOUNTER — Ambulatory Visit: Payer: Medicare Other | Admitting: Oncology

## 2013-03-01 ENCOUNTER — Other Ambulatory Visit (HOSPITAL_COMMUNITY)
Admission: RE | Admit: 2013-03-01 | Discharge: 2013-03-01 | Disposition: A | Discharge status: Home / Self Care | Payer: Medicare Other | Source: Ambulatory Visit | Attending: Gynecology | Admitting: Gynecology

## 2013-03-01 ENCOUNTER — Ambulatory Visit (INDEPENDENT_AMBULATORY_CARE_PROVIDER_SITE_OTHER): Payer: Medicare Other | Admitting: Gynecology

## 2013-03-01 ENCOUNTER — Encounter: Payer: Self-pay | Admitting: Gynecology

## 2013-03-01 VITALS — BP 130/80 | Ht 64.0 in | Wt 171.0 lb

## 2013-03-01 DIAGNOSIS — Z9189 Other specified personal risk factors, not elsewhere classified: Secondary | ICD-10-CM | POA: Diagnosis not present

## 2013-03-01 DIAGNOSIS — C50919 Malignant neoplasm of unspecified site of unspecified female breast: Secondary | ICD-10-CM | POA: Diagnosis not present

## 2013-03-01 DIAGNOSIS — Z124 Encounter for screening for malignant neoplasm of cervix: Secondary | ICD-10-CM | POA: Diagnosis not present

## 2013-03-01 DIAGNOSIS — N952 Postmenopausal atrophic vaginitis: Secondary | ICD-10-CM

## 2013-03-01 DIAGNOSIS — D219 Benign neoplasm of connective and other soft tissue, unspecified: Secondary | ICD-10-CM | POA: Diagnosis not present

## 2013-03-01 NOTE — Addendum Note (Signed)
Addended by: Dayna Barker on: 03/01/2013 12:24 PM   Modules accepted: Orders

## 2013-03-01 NOTE — Patient Instructions (Signed)
Scheduled bone density through this office once you or through breast cancer treatments. Follow up in one year for annual gynecologic exam.

## 2013-03-01 NOTE — Progress Notes (Signed)
Shannon Grant 1945-08-22 [id number]        67 y.o.  G3P3 for followup exam.  Several issues below.  Past medical history,surgical history, problem list, medications, allergies, family history and social history were all reviewed and documented in the EPIC chart.  ROS:  Performed and pertinent positives and negatives are included in the history, assessment and plan .  Exam: Kim assistant Filed Vitals:   03/01/13 1159  BP: 130/80  Height: 5\' 4"  (1.626 m)  Weight: 171 lb (77.565 kg)   General appearance  Normal Skin grossly normal Head/Neck normal with no cervical or supraclavicular adenopathy thyroid normal Lungs  clear Cardiac RR, without RMG Abdominal  soft, nontender, without masses, organomegaly or hernia Breasts  examined lying and sitting without masses, retractions, discharge or axillary adenopathy. Pelvic  Ext/BUS/vagina  Normal with atrophic changes  Cervix  Normal with atrophic changes, Pap done  Uterus  axial to retroverted generous in size, nontender   Adnexa  Without masses or tenderness    Anus and perineum  Normal   Rectovaginal  Normal sphincter tone without palpated masses or tenderness.    Assessment/Plan:  67 y.o. G3P3 female for annual exam.   1. Postmenopausal/atrophic genital changes. Patient without significant hot flashes night sweats vaginal dryness. Is not sexually active. No vaginal bleeding. We'll continue to monitor. Knows to report any vaginal bleeding. 2. History of leiomyomata. Status post hysteroscopic resection of myoma and polyp last year. No bleeding since. Exam is stable with mild enlargement. We'll continue to monitor with annual exams. 3. Recent diagnosis triple negative breast cancer. Undergoing lumpectomy this week with planned chemotherapy and radiation to follow. 4. Pap smear 2010. Pap smear done today. No history of abnormal Pap smears previously. Options to stop screening altogether or less frequent screening intervals reviewed. We'll  readdress on an annual basis. 5. Colonoscopy a number of years ago. Recommended screening colonoscopy and she agrees to arrange. 6. DEXA over 5 years ago. Results unknown. Had been arranging through her primary physician's office. Recommend baseline DEXA now and she agrees to arrange after she gets through her breast surgery. Increase calcium and vitamin D reviewed. 7. Health maintenance. No blood work done this is done through her primary physician's office. Followup in one year, sooner as needed.   Note: This document was prepared with digital dictation and possible smart phrase technology. Any transcriptional errors that result from this process are unintentional.   Dara Lords MD, 12:15 PM 03/01/2013

## 2013-03-02 MED ORDER — CEFAZOLIN SODIUM-DEXTROSE 2-3 GM-% IV SOLR
2.0000 g | INTRAVENOUS | Status: AC
  Administered 2013-03-03: 2 g via INTRAVENOUS
  Filled 2013-03-02: qty 50

## 2013-03-03 ENCOUNTER — Encounter (HOSPITAL_COMMUNITY)
Admission: RE | Disposition: A | Discharge status: Home / Self Care | Payer: Self-pay | Source: Ambulatory Visit | Attending: General Surgery

## 2013-03-03 ENCOUNTER — Ambulatory Visit (HOSPITAL_COMMUNITY): Payer: Medicare Other | Admitting: Anesthesiology

## 2013-03-03 ENCOUNTER — Ambulatory Visit (HOSPITAL_COMMUNITY): Payer: Medicare Other

## 2013-03-03 ENCOUNTER — Ambulatory Visit (HOSPITAL_COMMUNITY)
Admission: RE | Admit: 2013-03-03 | Discharge: 2013-03-03 | Disposition: A | Discharge status: Home / Self Care | Payer: Medicare Other | Source: Ambulatory Visit | Attending: General Surgery | Admitting: General Surgery

## 2013-03-03 ENCOUNTER — Encounter (HOSPITAL_COMMUNITY): Payer: Medicare Other | Admitting: Anesthesiology

## 2013-03-03 ENCOUNTER — Encounter (HOSPITAL_COMMUNITY)
Admission: RE | Admit: 2013-03-03 | Discharge: 2013-03-03 | Disposition: A | Discharge status: Home / Self Care | Payer: Medicare Other | Source: Ambulatory Visit | Attending: General Surgery | Admitting: General Surgery

## 2013-03-03 ENCOUNTER — Encounter (HOSPITAL_COMMUNITY): Payer: Self-pay | Admitting: *Deleted

## 2013-03-03 DIAGNOSIS — C779 Secondary and unspecified malignant neoplasm of lymph node, unspecified: Secondary | ICD-10-CM | POA: Diagnosis not present

## 2013-03-03 DIAGNOSIS — D259 Leiomyoma of uterus, unspecified: Secondary | ICD-10-CM | POA: Insufficient documentation

## 2013-03-03 DIAGNOSIS — C50411 Malignant neoplasm of upper-outer quadrant of right female breast: Secondary | ICD-10-CM

## 2013-03-03 DIAGNOSIS — E7800 Pure hypercholesterolemia, unspecified: Secondary | ICD-10-CM | POA: Insufficient documentation

## 2013-03-03 DIAGNOSIS — C773 Secondary and unspecified malignant neoplasm of axilla and upper limb lymph nodes: Secondary | ICD-10-CM | POA: Insufficient documentation

## 2013-03-03 DIAGNOSIS — Z87891 Personal history of nicotine dependence: Secondary | ICD-10-CM | POA: Diagnosis not present

## 2013-03-03 DIAGNOSIS — I10 Essential (primary) hypertension: Secondary | ICD-10-CM | POA: Diagnosis not present

## 2013-03-03 DIAGNOSIS — C50419 Malignant neoplasm of upper-outer quadrant of unspecified female breast: Secondary | ICD-10-CM | POA: Diagnosis not present

## 2013-03-03 DIAGNOSIS — E039 Hypothyroidism, unspecified: Secondary | ICD-10-CM | POA: Diagnosis not present

## 2013-03-03 DIAGNOSIS — C50919 Malignant neoplasm of unspecified site of unspecified female breast: Secondary | ICD-10-CM

## 2013-03-03 DIAGNOSIS — J9819 Other pulmonary collapse: Secondary | ICD-10-CM | POA: Diagnosis not present

## 2013-03-03 HISTORY — PX: PORTACATH PLACEMENT: SHX2246

## 2013-03-03 HISTORY — PX: BREAST LUMPECTOMY WITH NEEDLE LOCALIZATION AND AXILLARY SENTINEL LYMPH NODE BX: SHX5760

## 2013-03-03 SURGERY — BREAST LUMPECTOMY WITH NEEDLE LOCALIZATION AND AXILLARY SENTINEL LYMPH NODE BX
Anesthesia: General | Site: Chest | Laterality: Right

## 2013-03-03 MED ORDER — OXYCODONE HCL 5 MG PO TABS: 5.0000 mg | ORAL_TABLET | Freq: Once | ORAL | Status: DC | PRN

## 2013-03-03 MED ORDER — HYDROMORPHONE HCL PF 1 MG/ML IJ SOLN
0.2500 mg | INTRAMUSCULAR | Status: DC | PRN
  Administered 2013-03-03 (×2): 0.5 mg via INTRAVENOUS

## 2013-03-03 MED ORDER — TECHNETIUM TC 99M SULFUR COLLOID FILTERED: 1.0000 | Freq: Once | INTRAVENOUS | Status: AC | PRN

## 2013-03-03 MED ORDER — DEXAMETHASONE SODIUM PHOSPHATE 4 MG/ML IJ SOLN
INTRAMUSCULAR | Status: DC | PRN
  Administered 2013-03-03: 8 mg via INTRAVENOUS

## 2013-03-03 MED ORDER — SODIUM CHLORIDE 0.9 % IJ SOLN
INTRAMUSCULAR | Status: DC | PRN
  Administered 2013-03-03: 12:00:00 via INTRAMUSCULAR

## 2013-03-03 MED ORDER — LACTATED RINGERS IV SOLN
INTRAVENOUS | Status: DC | PRN
  Administered 2013-03-03: 10:00:00 via INTRAVENOUS

## 2013-03-03 MED ORDER — HEPARIN SOD (PORK) LOCK FLUSH 100 UNIT/ML IV SOLN
INTRAVENOUS | Status: DC | PRN
  Administered 2013-03-03: 500 [IU]

## 2013-03-03 MED ORDER — ONDANSETRON HCL 4 MG/2ML IJ SOLN: 4.0000 mg | Freq: Four times a day (QID) | INTRAMUSCULAR | Status: DC | PRN

## 2013-03-03 MED ORDER — HYDROMORPHONE HCL PF 1 MG/ML IJ SOLN
INTRAMUSCULAR | Status: AC
  Administered 2013-03-03: 0.5 mg via INTRAVENOUS
  Filled 2013-03-03: qty 1

## 2013-03-03 MED ORDER — FENTANYL CITRATE 0.05 MG/ML IJ SOLN
INTRAMUSCULAR | Status: AC
  Filled 2013-03-03: qty 2

## 2013-03-03 MED ORDER — TRAMADOL HCL 50 MG PO TABS: 50.0000 mg | ORAL_TABLET | Freq: Four times a day (QID) | ORAL | Status: DC | PRN

## 2013-03-03 MED ORDER — LACTATED RINGERS IV SOLN
INTRAVENOUS | Status: DC
  Administered 2013-03-03: 10:00:00 via INTRAVENOUS

## 2013-03-03 MED ORDER — PROMETHAZINE HCL 25 MG/ML IJ SOLN
6.2500 mg | INTRAMUSCULAR | Status: DC | PRN
  Administered 2013-03-03: 6.25 mg via INTRAVENOUS

## 2013-03-03 MED ORDER — MIDAZOLAM HCL 2 MG/2ML IJ SOLN
INTRAMUSCULAR | Status: AC
  Filled 2013-03-03: qty 2

## 2013-03-03 MED ORDER — OXYCODONE HCL 5 MG PO TABS
ORAL_TABLET | ORAL | Status: AC
  Administered 2013-03-03: 5 mg
  Filled 2013-03-03: qty 1

## 2013-03-03 MED ORDER — PROMETHAZINE HCL 25 MG/ML IJ SOLN
INTRAMUSCULAR | Status: AC
  Administered 2013-03-03: 6.25 mg via INTRAVENOUS
  Filled 2013-03-03: qty 1

## 2013-03-03 MED ORDER — LIDOCAINE HCL (CARDIAC) 10 MG/ML IV SOLN
INTRAVENOUS | Status: DC | PRN
  Administered 2013-03-03: 70 mg via INTRAVENOUS

## 2013-03-03 MED ORDER — SODIUM CHLORIDE 0.9 % IR SOLN
Status: DC | PRN
  Administered 2013-03-03: 12:00:00

## 2013-03-03 MED ORDER — 0.9 % SODIUM CHLORIDE (POUR BTL) OPTIME
TOPICAL | Status: DC | PRN
  Administered 2013-03-03: 1000 mL

## 2013-03-03 MED ORDER — BUPIVACAINE HCL (PF) 0.25 % IJ SOLN
INTRAMUSCULAR | Status: DC | PRN
  Administered 2013-03-03: 30 mL

## 2013-03-03 MED ORDER — OXYCODONE-ACETAMINOPHEN 5-325 MG PO TABS: 1.0000 | ORAL_TABLET | ORAL | Status: DC | PRN

## 2013-03-03 MED ORDER — OXYCODONE HCL 5 MG/5ML PO SOLN: 5.0000 mg | Freq: Once | ORAL | Status: DC | PRN

## 2013-03-03 MED ORDER — ONDANSETRON HCL 4 MG/2ML IJ SOLN
INTRAMUSCULAR | Status: AC
  Administered 2013-03-03: 4 mg
  Filled 2013-03-03: qty 2

## 2013-03-03 MED ORDER — PROPOFOL 10 MG/ML IV BOLUS
INTRAVENOUS | Status: DC | PRN
  Administered 2013-03-03: 180 mg via INTRAVENOUS

## 2013-03-03 MED ORDER — MIDAZOLAM HCL 2 MG/2ML IJ SOLN
1.0000 mg | INTRAMUSCULAR | Status: DC | PRN
  Administered 2013-03-03 (×2): 1 mg via INTRAVENOUS

## 2013-03-03 MED ORDER — ONDANSETRON HCL 4 MG/2ML IJ SOLN
INTRAMUSCULAR | Status: DC | PRN
  Administered 2013-03-03: 4 mg via INTRAVENOUS

## 2013-03-03 MED ORDER — FENTANYL CITRATE 0.05 MG/ML IJ SOLN
50.0000 ug | INTRAMUSCULAR | Status: DC | PRN
  Administered 2013-03-03: 100 ug via INTRAVENOUS

## 2013-03-03 MED ORDER — FENTANYL CITRATE 0.05 MG/ML IJ SOLN
INTRAMUSCULAR | Status: DC | PRN
  Administered 2013-03-03 (×5): 50 ug via INTRAVENOUS

## 2013-03-03 SURGICAL SUPPLY — 77 items
ADH SKN CLS APL DERMABOND .7 (GAUZE/BANDAGES/DRESSINGS) ×6
APPLIER CLIP 9.375 MED OPEN (MISCELLANEOUS) ×3
APR CLP MED 9.3 20 MLT OPN (MISCELLANEOUS) ×2
BAG DECANTER FOR FLEXI CONT (MISCELLANEOUS) ×3 IMPLANT
BINDER BREAST LRG (GAUZE/BANDAGES/DRESSINGS) IMPLANT
BINDER BREAST XLRG (GAUZE/BANDAGES/DRESSINGS) ×1 IMPLANT
BLADE SURG 10 STRL SS (BLADE) ×3 IMPLANT
BLADE SURG 15 STRL LF DISP TIS (BLADE) ×2 IMPLANT
BLADE SURG 15 STRL SS (BLADE) ×3
CANISTER SUCTION 2500CC (MISCELLANEOUS) ×3 IMPLANT
CHLORAPREP W/TINT 10.5 ML (MISCELLANEOUS) ×3 IMPLANT
CHLORAPREP W/TINT 26ML (MISCELLANEOUS) ×3 IMPLANT
CLIP APPLIE 9.375 MED OPEN (MISCELLANEOUS) IMPLANT
CONT SPEC 4OZ CLIKSEAL STRL BL (MISCELLANEOUS) ×3 IMPLANT
COVER PROBE W GEL 5X96 (DRAPES) ×3 IMPLANT
COVER SURGICAL LIGHT HANDLE (MISCELLANEOUS) ×3 IMPLANT
CRADLE DONUT ADULT HEAD (MISCELLANEOUS) ×3 IMPLANT
DECANTER SPIKE VIAL GLASS SM (MISCELLANEOUS) ×2 IMPLANT
DERMABOND ADVANCED (GAUZE/BANDAGES/DRESSINGS) ×3
DERMABOND ADVANCED .7 DNX12 (GAUZE/BANDAGES/DRESSINGS) ×2 IMPLANT
DEVICE DUBIN SPECIMEN MAMMOGRA (MISCELLANEOUS) ×3 IMPLANT
DRAPE C-ARM 42X72 X-RAY (DRAPES) ×3 IMPLANT
DRAPE CHEST BREAST 15X10 FENES (DRAPES) ×3 IMPLANT
DRAPE UTILITY 15X26 W/TAPE STR (DRAPE) ×6 IMPLANT
ELECT CAUTERY BLADE 6.4 (BLADE) ×3 IMPLANT
ELECT COATED BLADE 2.86 ST (ELECTRODE) ×3 IMPLANT
ELECT REM PT RETURN 9FT ADLT (ELECTROSURGICAL) ×3
ELECTRODE REM PT RTRN 9FT ADLT (ELECTROSURGICAL) ×2 IMPLANT
GAUZE SPONGE 4X4 16PLY XRAY LF (GAUZE/BANDAGES/DRESSINGS) ×3 IMPLANT
GLOVE BIO SURGEON STRL SZ7 (GLOVE) ×1 IMPLANT
GLOVE BIO SURGEON STRL SZ7.5 (GLOVE) ×6 IMPLANT
GLOVE BIOGEL PI IND STRL 7.0 (GLOVE) IMPLANT
GLOVE BIOGEL PI IND STRL 7.5 (GLOVE) IMPLANT
GLOVE BIOGEL PI INDICATOR 7.0 (GLOVE) ×3
GLOVE BIOGEL PI INDICATOR 7.5 (GLOVE) ×3
GOWN STRL NON-REIN LRG LVL3 (GOWN DISPOSABLE) ×8 IMPLANT
INTRODUCER COOK 11FR (CATHETERS) IMPLANT
KIT BASIN OR (CUSTOM PROCEDURE TRAY) ×3 IMPLANT
KIT MARKER MARGIN INK (KITS) ×1 IMPLANT
KIT PORT POWER 8FR ISP CVUE (Catheter) ×2 IMPLANT
KIT PORT POWER 9.6FR MRI PREA (Catheter) IMPLANT
KIT PORT POWER ISP 8FR (Catheter) IMPLANT
KIT POWER CATH 8FR (Catheter) IMPLANT
KIT ROOM TURNOVER OR (KITS) ×3 IMPLANT
NDL 18GX1X1/2 (RX/OR ONLY) (NEEDLE) ×2 IMPLANT
NDL HYPO 25GX1X1/2 BEV (NEEDLE) ×4 IMPLANT
NEEDLE 18GX1X1/2 (RX/OR ONLY) (NEEDLE) ×3 IMPLANT
NEEDLE 22X1 1/2 (OR ONLY) (NEEDLE) IMPLANT
NEEDLE HYPO 25GX1X1/2 BEV (NEEDLE) ×6 IMPLANT
NS IRRIG 1000ML POUR BTL (IV SOLUTION) ×3 IMPLANT
PACK SURGICAL SETUP 50X90 (CUSTOM PROCEDURE TRAY) ×3 IMPLANT
PAD ARMBOARD 7.5X6 YLW CONV (MISCELLANEOUS) ×3 IMPLANT
PENCIL BUTTON HOLSTER BLD 10FT (ELECTRODE) ×4 IMPLANT
SET INTRODUCER 12FR PACEMAKER (SHEATH) IMPLANT
SET SHEATH INTRODUCER 10FR (MISCELLANEOUS) IMPLANT
SHEATH COOK PEEL AWAY SET 9F (SHEATH) IMPLANT
SPONGE LAP 18X18 X RAY DECT (DISPOSABLE) ×3 IMPLANT
SUT MNCRL AB 4-0 PS2 18 (SUTURE) ×5 IMPLANT
SUT PROLENE 2 0 SH 30 (SUTURE) ×6 IMPLANT
SUT SILK 2 0 (SUTURE)
SUT SILK 2 0 SH (SUTURE) IMPLANT
SUT SILK 2-0 18XBRD TIE 12 (SUTURE) IMPLANT
SUT VIC AB 3-0 54X BRD REEL (SUTURE) ×2 IMPLANT
SUT VIC AB 3-0 BRD 54 (SUTURE) ×3
SUT VIC AB 3-0 SH 18 (SUTURE) ×3 IMPLANT
SUT VIC AB 3-0 SH 27 (SUTURE) ×3
SUT VIC AB 3-0 SH 27XBRD (SUTURE) ×2 IMPLANT
SYR 20ML ECCENTRIC (SYRINGE) ×6 IMPLANT
SYR 5ML LUER SLIP (SYRINGE) ×3 IMPLANT
SYR BULB 3OZ (MISCELLANEOUS) ×3 IMPLANT
SYR CONTROL 10ML LL (SYRINGE) ×6 IMPLANT
SYRINGE 10CC LL (SYRINGE) IMPLANT
TOWEL OR 17X24 6PK STRL BLUE (TOWEL DISPOSABLE) ×3 IMPLANT
TOWEL OR 17X26 10 PK STRL BLUE (TOWEL DISPOSABLE) ×3 IMPLANT
TOWEL OR NON WOVEN STRL DISP B (DISPOSABLE) ×1 IMPLANT
TUBE CONNECTING 12X1/4 (SUCTIONS) ×4 IMPLANT
YANKAUER SUCT BULB TIP NO VENT (SUCTIONS) ×4 IMPLANT

## 2013-03-03 NOTE — Transfer of Care (Signed)
Immediate Anesthesia Transfer of Care Note  Patient: Shannon Grant  Procedure(s) Performed: Procedure(s): BREAST LUMPECTOMY WITH NEEDLE LOCALIZATION AND AXILLARY SENTINEL LYMPH NODE BX (Right) INSERTION PORT-A-CATH (Left)  Patient Location: PACU  Anesthesia Type:General  Level of Consciousness: awake, alert , oriented and patient cooperative  Airway & Oxygen Therapy: Patient Spontanous Breathing  Post-op Assessment: Report given to PACU RN, Post -op Vital signs reviewed and stable and Patient moving all extremities  Post vital signs: Reviewed and stable  Complications: No apparent anesthesia complications

## 2013-03-03 NOTE — Anesthesia Preprocedure Evaluation (Signed)
Anesthesia Evaluation  Patient identified by MRN, date of birth, ID band Patient awake    Reviewed: Allergy & Precautions, H&P , NPO status , Patient's Chart, lab work & pertinent test results  Airway Mallampati: II  Neck ROM: full    Dental   Pulmonary former smoker,          Cardiovascular hypertension,     Neuro/Psych    GI/Hepatic   Endo/Other    Renal/GU      Musculoskeletal  (+) Arthritis -,   Abdominal   Peds  Hematology   Anesthesia Other Findings   Reproductive/Obstetrics                           Anesthesia Physical Anesthesia Plan  ASA: II  Anesthesia Plan: General   Post-op Pain Management:    Induction: Intravenous  Airway Management Planned: LMA  Additional Equipment:   Intra-op Plan:   Post-operative Plan:   Informed Consent: I have reviewed the patients History and Physical, chart, labs and discussed the procedure including the risks, benefits and alternatives for the proposed anesthesia with the patient or authorized representative who has indicated his/her understanding and acceptance.     Plan Discussed with: CRNA, Anesthesiologist and Surgeon  Anesthesia Plan Comments:         Anesthesia Quick Evaluation
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
## 2013-03-03 NOTE — Anesthesia Postprocedure Evaluation (Signed)
Anesthesia Post Note  Patient: Shannon Grant  Procedure(s) Performed: Procedure(s) (LRB): BREAST LUMPECTOMY WITH NEEDLE LOCALIZATION AND AXILLARY SENTINEL LYMPH NODE BX (Right) INSERTION PORT-A-CATH (Left)  Anesthesia type: General  Patient location: PACU  Post pain: Pain level controlled and Adequate analgesia  Post assessment: Post-op Vital signs reviewed, Patient's Cardiovascular Status Stable, Respiratory Function Stable, Patent Airway and Pain level controlled  Last Vitals:  Filed Vitals:   03/03/13 1445  BP: 143/62  Pulse:   Temp:   Resp:     Post vital signs: Reviewed and stable  Level of consciousness: awake, alert  and oriented  Complications: No apparent anesthesia complications

## 2013-03-03 NOTE — Interval H&P Note (Signed)
History and Physical Interval Note:  03/03/2013 10:03 AM  Shannon Grant  has presented today for surgery, with the diagnosis of right breast cancer  The various methods of treatment have been discussed with the patient and family. After consideration of risks, benefits and other options for treatment, the patient has consented to  Procedure(s): BREAST LUMPECTOMY WITH NEEDLE LOCALIZATION AND AXILLARY SENTINEL LYMPH NODE BX (Right) INSERTION PORT-A-CATH (N/A) as a surgical intervention .  The patient's history has been reviewed, patient examined, no change in status, stable for surgery.  I have reviewed the patient's chart and labs.  Questions were answered to the patient's satisfaction.     TOTH III,Tavon Magnussen S

## 2013-03-03 NOTE — Op Note (Signed)
03/03/2013  12:28 PM  PATIENT:  Shannon Grant  67 y.o. female  PRE-OPERATIVE DIAGNOSIS:  Right Breast Cancer  POST-OPERATIVE DIAGNOSIS:  Right Breast Cancer  PROCEDURE:  Procedure(s): BREAST LUMPECTOMY WITH NEEDLE LOCALIZATION AND AXILLARY SENTINEL LYMPH NODE BX (Right) INSERTION PORT-A-CATH (Left)  SURGEON:  Surgeon(s) and Role:    * Robyne Askew, MD - Primary  PHYSICIAN ASSISTANT:   ASSISTANTS: none   ANESTHESIA:   general  EBL:     BLOOD ADMINISTERED:none  DRAINS: none   LOCAL MEDICATIONS USED:  MARCAINE     SPECIMEN:  Source of Specimen:  right breast tissue and sentinel node  DISPOSITION OF SPECIMEN:  PATHOLOGY  COUNTS:  YES  TOURNIQUET:  * No tourniquets in log *  DICTATION: .Dragon Dictation After informed consent was obtained the patient was brought to the operating room placed in the supine position on the operating room table. After adequate induction of general anesthesia a roll was placed between the patient's shoulder blades to extend the shoulder slightly. The left chest and neck area were then prepped with ChloraPrep, allowed to dry, and draped in usual sterile manner. The patient was placed in Trendelenburg position. A large bore needle from the Port-A-Cath kit was used to slide beneath the bend of the clavicle aiming towards the sternal notch on the left chest and in doing so we were able to access the left subclavian vein. A wire was fed through the needle using the Seldinger technique without difficulty. The wire was confirmed in the central venous system using real-time fluoroscopy. An incision was then made on the left chest with a 15 blade knife. This incision was carried through the skin and subcutaneous tissue sharply with the electrocautery. Blunt dissection was carried out inferior to the incision and subcutaneous tissue to create a subcutaneous pocket. Next the tubing was attached to the reservoir. The reservoir was placed in the pocket and the  length of the tubing was estimated using real-time fluoroscopy. Next the sheath and dilator were threaded over the wire using the Seldinger technique without difficulty. The wire and dilator were removed. The tubing was fed through the sheath as far as it could be fed and held in place while the sheath was gently cracked and separated. A real-time fluoroscopy image showed that the tip of the catheter initially was in the innominate vein. I then disconnected the reservoir and fed a new wire through the tubing. The old tubing was removed. A new tubing was cut to a more appropriate length and attached to a new reservoir. The reservoir was placed in the pocket. A new sheath and dilator was placed over the wire. The tubing was then fed through the sheath again as far as it can be fed and held in place while the tubing was gently cracked and separated. Another real-time fluoroscopy image showed the tip of the catheter being in the superior vena cava. The tubing was then permanently attached to the reservoir. The reservoir was anchored in the pocket with 2 2-0 Prolene stitches. The reservoir was then aspirated and it aspirated blood easily. The port was then flushed initially with a dilute heparin solution and then with more concentrated heparin solution. The subcutaneous tissue was then closed over the port with interrupted 0 Vicryl stitches. The skin was then closed with interrupted 4 model subcuticular stitches. Dermabond dressings were applied. At this point the drapes were removed. The right breast was exposed. The right breast, chest, and axillary area were then  prepped with ChloraPrep, allowed to dry, and draped in usual sterile manner. Earlier in the day the patient underwent injection of 1 mCi of technetium sulfur colloid in the subareolar position on the right. Also earlier in the day the patient underwent a wire localization procedure and the wire was entering the right breast in the upper outer quadrant and  headed deep. At this point, 2 cc of methylene blue 3 cc of injectable saline were also injected in the subareolar position on the right and the breast was massaged for several minutes. A hot spot was identified in the right axilla using the neoprobe device. A small transverse incision was made overlying the hot spot with a 15 blade knife. This incision was carried through the skin and subcutaneous tissue sharply with electrocautery until the axilla was entered. A wheatland retractor was deployed. Using the neoprobe to direct blunt hemostat dissection we were able to identify a lymph node. This was excised by a combination of sharp dissection with electrocautery and then the lymphatics were clamped with hemostats divided and ligated with 3-0 Vicryl ties. The ex vivo counts on the sentinel node were approximately 150. This was sent to pathology for further evaluation. No other hot, blue, or palpable lymph nodes were identified in the right axilla. The deep layer of the axilla was then closed with interrupted 3-0 Vicryl stitches. The wound was infiltrated with quarter Marcaine. The skin was closed with a running 4 Monocryl subcuticular stitch. Attention was then turned to the right breast. A curvilinear transversely oriented incision was made overlying the past the wire with a 15 blade knife. This incision was carried through the skin and subcutaneous tissue sharply with electrocautery until the breast tissue was entered. Once the breast tissue was entered the path of the wire could be palpated. A circular portion of breast tissue was excised sharply with the electrocautery around the path of the wire. Once the specimen was removed it was oriented with the assigned Paint colors. It was then sent to pathology for further evaluation. Hemostasis was achieved using the Bovie electrocautery. The wound was infiltrated with quarter percent Marcaine and irrigated with copious amounts of saline. The deep layer the wound was  then closed with interrupted 3-0 Vicryl stitches. The skin was then closed with interrupted 4-0 Monocryl subcuticular stitches. Dermabond dressings were applied. The patient tolerated the procedure well. At the end of the case all needle sponge and instrument counts were correct. The patient was then awakened and taken to recovery in stable condition.  PLAN OF CARE: Discharge to home after PACU  PATIENT DISPOSITION:  PACU - hemodynamically stable.   Delay start of Pharmacological VTE agent (>24hrs) due to surgical blood loss or risk of bleeding: not applicable

## 2013-03-04 ENCOUNTER — Encounter (HOSPITAL_COMMUNITY): Payer: Self-pay | Admitting: General Surgery

## 2013-03-09 ENCOUNTER — Ambulatory Visit: Payer: Medicare Other | Admitting: Cardiology

## 2013-03-11 ENCOUNTER — Encounter: Payer: Self-pay | Admitting: *Deleted

## 2013-03-14 ENCOUNTER — Other Ambulatory Visit: Payer: Medicare Other

## 2013-03-15 ENCOUNTER — Encounter: Payer: Self-pay | Admitting: Cardiology

## 2013-03-15 ENCOUNTER — Ambulatory Visit (INDEPENDENT_AMBULATORY_CARE_PROVIDER_SITE_OTHER): Payer: Medicare Other | Admitting: Cardiology

## 2013-03-15 VITALS — BP 134/88 | HR 78 | Ht 64.0 in | Wt 170.0 lb

## 2013-03-15 DIAGNOSIS — E785 Hyperlipidemia, unspecified: Secondary | ICD-10-CM | POA: Diagnosis not present

## 2013-03-15 DIAGNOSIS — I10 Essential (primary) hypertension: Secondary | ICD-10-CM | POA: Diagnosis not present

## 2013-03-15 DIAGNOSIS — R9431 Abnormal electrocardiogram [ECG] [EKG]: Secondary | ICD-10-CM

## 2013-03-15 MED ORDER — METOPROLOL TARTRATE 25 MG PO TABS: 12.5000 mg | ORAL_TABLET | Freq: Two times a day (BID) | ORAL | Status: DC
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
## 2013-03-15 NOTE — Patient Instructions (Signed)
Your physician wants you to follow-up in: 4 or 5 months. You will receive a reminder letter in the mail two months in advance. If you don't receive a letter, please call our office to schedule the follow-up appointment.

## 2013-03-17 ENCOUNTER — Other Ambulatory Visit (HOSPITAL_BASED_OUTPATIENT_CLINIC_OR_DEPARTMENT_OTHER): Payer: Medicare Other

## 2013-03-17 ENCOUNTER — Ambulatory Visit (HOSPITAL_BASED_OUTPATIENT_CLINIC_OR_DEPARTMENT_OTHER): Payer: Medicare Other | Admitting: Oncology

## 2013-03-17 VITALS — BP 172/86 | HR 67 | Temp 98.3°F | Resp 18 | Ht 64.0 in | Wt 171.3 lb

## 2013-03-17 DIAGNOSIS — R599 Enlarged lymph nodes, unspecified: Secondary | ICD-10-CM

## 2013-03-17 DIAGNOSIS — C50919 Malignant neoplasm of unspecified site of unspecified female breast: Secondary | ICD-10-CM

## 2013-03-17 DIAGNOSIS — R9431 Abnormal electrocardiogram [ECG] [EKG]: Secondary | ICD-10-CM | POA: Insufficient documentation

## 2013-03-17 DIAGNOSIS — Z171 Estrogen receptor negative status [ER-]: Secondary | ICD-10-CM | POA: Diagnosis not present

## 2013-03-17 DIAGNOSIS — C50419 Malignant neoplasm of upper-outer quadrant of unspecified female breast: Secondary | ICD-10-CM | POA: Diagnosis not present

## 2013-03-17 DIAGNOSIS — C50411 Malignant neoplasm of upper-outer quadrant of right female breast: Secondary | ICD-10-CM

## 2013-03-17 DIAGNOSIS — E785 Hyperlipidemia, unspecified: Secondary | ICD-10-CM | POA: Insufficient documentation

## 2013-03-17 DIAGNOSIS — I10 Essential (primary) hypertension: Secondary | ICD-10-CM | POA: Insufficient documentation

## 2013-03-17 LAB — COMPREHENSIVE METABOLIC PANEL (CC13)
ALT: 12 U/L (ref 0–55)
AST: 15 U/L (ref 5–34)
Anion Gap: 11 mEq/L (ref 3–11)
Creatinine: 0.7 mg/dL (ref 0.6–1.1)
Potassium: 3.7 mEq/L (ref 3.5–5.1)
Sodium: 143 mEq/L (ref 136–145)
Total Bilirubin: 0.52 mg/dL (ref 0.20–1.20)
Total Protein: 7.1 g/dL (ref 6.4–8.3)

## 2013-03-17 LAB — CBC WITH DIFFERENTIAL/PLATELET
BASO%: 1.4 % (ref 0.0–2.0)
EOS%: 4.1 % (ref 0.0–7.0)
Eosinophils Absolute: 0.3 10*3/uL (ref 0.0–0.5)
HCT: 43.6 % (ref 34.8–46.6)
LYMPH%: 19.4 % (ref 14.0–49.7)
MCH: 28.7 pg (ref 25.1–34.0)
MCHC: 32.7 g/dL (ref 31.5–36.0)
MONO%: 8.2 % (ref 0.0–14.0)
NEUT%: 66.9 % (ref 38.4–76.8)
Platelets: 358 10*3/uL (ref 145–400)
RDW: 14.2 % (ref 11.2–14.5)

## 2013-03-17 MED ORDER — ONDANSETRON HCL 8 MG PO TABS: 8.0000 mg | ORAL_TABLET | Freq: Two times a day (BID) | ORAL | Status: DC | PRN

## 2013-03-17 MED ORDER — LORAZEPAM 0.5 MG PO TABS: 0.5000 mg | ORAL_TABLET | Freq: Four times a day (QID) | ORAL | Status: DC | PRN

## 2013-03-17 MED ORDER — DEXAMETHASONE 4 MG PO TABS: ORAL_TABLET | ORAL | Status: DC

## 2013-03-17 MED ORDER — PROCHLORPERAZINE MALEATE 10 MG PO TABS: 10.0000 mg | ORAL_TABLET | Freq: Four times a day (QID) | ORAL | Status: DC | PRN
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
## 2013-03-18 ENCOUNTER — Telehealth: Payer: Self-pay | Admitting: *Deleted

## 2013-03-18 ENCOUNTER — Encounter: Payer: Self-pay | Admitting: Oncology

## 2013-03-18 NOTE — Telephone Encounter (Signed)
sw pt gv appt for 04/04/13 @ 8:15am w/ tx to follow. Also gv appt for inj 04/05/13@11am  and 04/19/13@ 11:15am. i emailed MW to add the tx's...td

## 2013-03-18 NOTE — Telephone Encounter (Signed)
Per staff message and POF I have scheduled appts.  JMW  

## 2013-03-21 ENCOUNTER — Other Ambulatory Visit: Payer: Self-pay | Admitting: *Deleted

## 2013-03-21 ENCOUNTER — Encounter: Payer: Self-pay | Admitting: Oncology

## 2013-03-21 MED ORDER — LIDOCAINE-PRILOCAINE 2.5-2.5 % EX CREA: 1.0000 "application " | TOPICAL_CREAM | CUTANEOUS | Status: DC | PRN

## 2013-03-21 NOTE — Telephone Encounter (Signed)
Pt is aware that her tx will follow her appt for 04/04/13 @ 8;15am. i emailed MW to adjust the tx for 04/04/13...td

## 2013-03-21 NOTE — Telephone Encounter (Signed)
Pt attended the class on 03/14/13...td

## 2013-03-22 ENCOUNTER — Telehealth: Payer: Self-pay | Admitting: *Deleted

## 2013-03-22 ENCOUNTER — Encounter (HOSPITAL_COMMUNITY)
Admission: RE | Admit: 2013-03-22 | Discharge: 2013-03-22 | Disposition: A | Discharge status: Home / Self Care | Payer: Medicare Other | Source: Ambulatory Visit | Attending: Oncology | Admitting: Oncology

## 2013-03-22 ENCOUNTER — Other Ambulatory Visit: Payer: Self-pay | Admitting: *Deleted

## 2013-03-22 ENCOUNTER — Encounter (HOSPITAL_COMMUNITY): Payer: Self-pay

## 2013-03-22 DIAGNOSIS — C50411 Malignant neoplasm of upper-outer quadrant of right female breast: Secondary | ICD-10-CM

## 2013-03-22 DIAGNOSIS — N852 Hypertrophy of uterus: Secondary | ICD-10-CM | POA: Diagnosis not present

## 2013-03-22 DIAGNOSIS — M169 Osteoarthritis of hip, unspecified: Secondary | ICD-10-CM | POA: Diagnosis not present

## 2013-03-22 DIAGNOSIS — I700 Atherosclerosis of aorta: Secondary | ICD-10-CM | POA: Insufficient documentation

## 2013-03-22 DIAGNOSIS — M5137 Other intervertebral disc degeneration, lumbosacral region: Secondary | ICD-10-CM | POA: Diagnosis not present

## 2013-03-22 DIAGNOSIS — M1610 Unilateral primary osteoarthritis, unspecified hip: Secondary | ICD-10-CM | POA: Insufficient documentation

## 2013-03-22 DIAGNOSIS — R918 Other nonspecific abnormal finding of lung field: Secondary | ICD-10-CM | POA: Insufficient documentation

## 2013-03-22 DIAGNOSIS — N859 Noninflammatory disorder of uterus, unspecified: Secondary | ICD-10-CM | POA: Insufficient documentation

## 2013-03-22 DIAGNOSIS — C50919 Malignant neoplasm of unspecified site of unspecified female breast: Secondary | ICD-10-CM | POA: Insufficient documentation

## 2013-03-22 DIAGNOSIS — K7689 Other specified diseases of liver: Secondary | ICD-10-CM | POA: Diagnosis not present

## 2013-03-22 DIAGNOSIS — M51379 Other intervertebral disc degeneration, lumbosacral region without mention of lumbar back pain or lower extremity pain: Secondary | ICD-10-CM | POA: Insufficient documentation

## 2013-03-22 MED ORDER — FLUDEOXYGLUCOSE F - 18 (FDG) INJECTION
14.3000 | Freq: Once | INTRAVENOUS | Status: AC | PRN
  Administered 2013-03-22: 14.3 via INTRAVENOUS

## 2013-03-22 MED ORDER — LIDOCAINE-PRILOCAINE 2.5-2.5 % EX CREA: TOPICAL_CREAM | CUTANEOUS | Status: DC

## 2013-03-22 NOTE — Telephone Encounter (Signed)
This RN called pt post MD review of PET scan with results.

## 2013-03-22 NOTE — Telephone Encounter (Signed)
Patient sent MyChart message for Emla cream for her port-a-cath.  Noted refill sent on 03-21-2013.  Reports she called [facility] and they report no receipt.   Receipt by pharmacy confirmation noted last evening at 5-:16 pm.  Patient also states the radiology department informed her the test performed yesterday results would be ready last night and she'd like the results.  MyChart results will be released on 04-05-2013.  Informed her Dr. Darnelle Catalan will go over results during the 03-29-2014 visit.   This nurse called, spoke with pharmacist and verbal order given

## 2013-03-22 NOTE — Telephone Encounter (Signed)
Per staff message and POF I have scheduled appts.  JMW  

## 2013-03-23 ENCOUNTER — Other Ambulatory Visit: Payer: Self-pay | Admitting: *Deleted

## 2013-03-23 DIAGNOSIS — D0590 Unspecified type of carcinoma in situ of unspecified breast: Secondary | ICD-10-CM

## 2013-03-25 ENCOUNTER — Encounter (INDEPENDENT_AMBULATORY_CARE_PROVIDER_SITE_OTHER): Payer: Self-pay | Admitting: General Surgery

## 2013-03-25 ENCOUNTER — Ambulatory Visit (INDEPENDENT_AMBULATORY_CARE_PROVIDER_SITE_OTHER): Payer: Medicare Other | Admitting: General Surgery

## 2013-03-25 VITALS — BP 150/84 | HR 72 | Temp 98.6°F | Resp 14 | Ht 64.0 in | Wt 170.8 lb

## 2013-03-25 DIAGNOSIS — C50411 Malignant neoplasm of upper-outer quadrant of right female breast: Secondary | ICD-10-CM

## 2013-03-25 DIAGNOSIS — C50419 Malignant neoplasm of upper-outer quadrant of unspecified female breast: Secondary | ICD-10-CM

## 2013-03-25 NOTE — Progress Notes (Signed)
Subjective:     Patient ID: Shannon Grant, female   DOB: 1945/12/20, 67 y.o.   MRN: [id number]  HPI The patient is a 67 year old white female who is about 3 weeks status post right lumpectomy and negative sentinel node biopsy for a right breast cancer. Her pathology showed the main area that was removed from the breast was an intramammary lymph node that was positive. Her sentinel node was negative. No other abnormal areas were identified on her mammograms Or her MRI studies.  Review of Systems     Objective:   Physical Exam On exam her right breast Incisions are healing nicely with no sign of infection or significant seroma    Assessment:     The patient is 3 weeks status post right lumpectomy for breast cancer     Plan:     At this point with an unknown primary it sounds as though the recommendation from medical and radiation oncology as for both chemotherapy and radiation. She is healing well so I will plan to see her back in about 3 months.

## 2013-03-25 NOTE — Patient Instructions (Signed)
Continue regular self exams Follow up with medical and radiation oncology

## 2013-03-28 ENCOUNTER — Telehealth: Payer: Self-pay | Admitting: *Deleted

## 2013-03-28 ENCOUNTER — Ambulatory Visit: Payer: Medicare Other | Admitting: Oncology

## 2013-03-28 ENCOUNTER — Other Ambulatory Visit: Payer: Self-pay | Admitting: *Deleted

## 2013-03-28 MED ORDER — TRAMADOL HCL 50 MG PO TABS: 50.0000 mg | ORAL_TABLET | Freq: Four times a day (QID) | ORAL | Status: DC | PRN

## 2013-03-28 NOTE — Telephone Encounter (Signed)
Pt called to discuss need for refill on Ultram d/t left hip pain r/t need for left replacement.  Discussed with pt that Dr. Carolynne Edouard wrote the original script and that we will discuss with Dr. Darnelle Catalan.  Received verbal understanding.  Discussed with Val, Dr. Darrall Dears nurse.  She will discuss with Dr. Darnelle Catalan.

## 2013-03-29 ENCOUNTER — Other Ambulatory Visit: Payer: Self-pay | Admitting: *Deleted

## 2013-03-29 ENCOUNTER — Other Ambulatory Visit: Payer: Medicare Other

## 2013-04-01 NOTE — Telephone Encounter (Signed)
Returned pt's call to inform her how to take Claritin after Neulasta injection which she will have on 04/05/2013. Informed pt to take 1 tab(10mg ) Claritin on the day of her injection, then 1 tablet daily for 7 days.

## 2013-04-04 ENCOUNTER — Ambulatory Visit (HOSPITAL_BASED_OUTPATIENT_CLINIC_OR_DEPARTMENT_OTHER): Payer: Medicare Other

## 2013-04-04 ENCOUNTER — Other Ambulatory Visit: Payer: Medicare Other

## 2013-04-04 ENCOUNTER — Ambulatory Visit (HOSPITAL_BASED_OUTPATIENT_CLINIC_OR_DEPARTMENT_OTHER): Payer: Medicare Other | Admitting: Oncology

## 2013-04-04 ENCOUNTER — Telehealth: Payer: Self-pay | Admitting: *Deleted

## 2013-04-04 ENCOUNTER — Telehealth: Payer: Self-pay | Admitting: Oncology

## 2013-04-04 ENCOUNTER — Other Ambulatory Visit: Payer: Self-pay | Admitting: Oncology

## 2013-04-04 VITALS — BP 144/85 | HR 78 | Temp 98.3°F | Resp 18 | Ht 64.0 in | Wt 170.2 lb

## 2013-04-04 DIAGNOSIS — F411 Generalized anxiety disorder: Secondary | ICD-10-CM | POA: Diagnosis not present

## 2013-04-04 DIAGNOSIS — M549 Dorsalgia, unspecified: Secondary | ICD-10-CM | POA: Diagnosis not present

## 2013-04-04 DIAGNOSIS — C50419 Malignant neoplasm of upper-outer quadrant of unspecified female breast: Secondary | ICD-10-CM

## 2013-04-04 DIAGNOSIS — M25559 Pain in unspecified hip: Secondary | ICD-10-CM

## 2013-04-04 DIAGNOSIS — Z5111 Encounter for antineoplastic chemotherapy: Secondary | ICD-10-CM

## 2013-04-04 DIAGNOSIS — C50411 Malignant neoplasm of upper-outer quadrant of right female breast: Secondary | ICD-10-CM

## 2013-04-04 DIAGNOSIS — Z171 Estrogen receptor negative status [ER-]: Secondary | ICD-10-CM | POA: Diagnosis not present

## 2013-04-04 MED ORDER — PALONOSETRON HCL INJECTION 0.25 MG/5ML
INTRAVENOUS | Status: AC
  Filled 2013-04-04: qty 5

## 2013-04-04 MED ORDER — DOXORUBICIN HCL CHEMO IV INJECTION 2 MG/ML
60.0000 mg/m2 | Freq: Once | INTRAVENOUS | Status: AC
  Administered 2013-04-04: 112 mg via INTRAVENOUS
  Filled 2013-04-04: qty 56

## 2013-04-04 MED ORDER — DEXAMETHASONE SODIUM PHOSPHATE 20 MG/5ML IJ SOLN
12.0000 mg | Freq: Once | INTRAMUSCULAR | Status: AC
  Administered 2013-04-04: 12 mg via INTRAVENOUS

## 2013-04-04 MED ORDER — PALONOSETRON HCL INJECTION 0.25 MG/5ML
0.2500 mg | Freq: Once | INTRAVENOUS | Status: AC
  Administered 2013-04-04: 0.25 mg via INTRAVENOUS

## 2013-04-04 MED ORDER — SODIUM CHLORIDE 0.9 % IV SOLN
600.0000 mg/m2 | Freq: Once | INTRAVENOUS | Status: AC
  Administered 2013-04-04: 1120 mg via INTRAVENOUS
  Filled 2013-04-04: qty 56

## 2013-04-04 MED ORDER — DEXAMETHASONE SODIUM PHOSPHATE 20 MG/5ML IJ SOLN
INTRAMUSCULAR | Status: AC
  Filled 2013-04-04: qty 5

## 2013-04-04 MED ORDER — HEPARIN SOD (PORK) LOCK FLUSH 100 UNIT/ML IV SOLN
500.0000 [IU] | Freq: Once | INTRAVENOUS | Status: AC | PRN
  Administered 2013-04-04: 500 [IU]
  Filled 2013-04-04: qty 5

## 2013-04-04 MED ORDER — SODIUM CHLORIDE 0.9 % IV SOLN
150.0000 mg | Freq: Once | INTRAVENOUS | Status: AC
  Administered 2013-04-04: 150 mg via INTRAVENOUS
  Filled 2013-04-04: qty 5

## 2013-04-04 MED ORDER — SODIUM CHLORIDE 0.9 % IV SOLN
Freq: Once | INTRAVENOUS | Status: AC
  Administered 2013-04-04: 09:00:00 via INTRAVENOUS

## 2013-04-04 MED ORDER — SODIUM CHLORIDE 0.9 % IJ SOLN
10.0000 mL | INTRAMUSCULAR | Status: DC | PRN
  Administered 2013-04-04: 10 mL
  Filled 2013-04-04: qty 10

## 2013-04-04 NOTE — Patient Instructions (Signed)
[facility] Discharge Instructions for Patients Receiving Chemotherapy  Today you received the following chemotherapy agents Adriamycin/Cytoxan  To help prevent nausea and vomiting after your treatment, we encourage you to take your nausea medication Zofran   If you develop nausea and vomiting that is not controlled by your nausea medication, call the clinic.   BELOW ARE SYMPTOMS THAT SHOULD BE REPORTED IMMEDIATELY:  *FEVER GREATER THAN 100.5 F  *CHILLS WITH OR WITHOUT FEVER  NAUSEA AND VOMITING THAT IS NOT CONTROLLED WITH YOUR NAUSEA MEDICATION  *UNUSUAL SHORTNESS OF BREATH  *UNUSUAL BRUISING OR BLEEDING  TENDERNESS IN MOUTH AND THROAT WITH OR WITHOUT PRESENCE OF ULCERS  *URINARY PROBLEMS  *BOWEL PROBLEMS  UNUSUAL RASH Items with * indicate a potential emergency and should be followed up as soon as possible.  Feel free to call the clinic you have any questions or concerns. The clinic phone number is [phone].     Cyclophosphamide injection What is this medicine? CYCLOPHOSPHAMIDE (sye kloe FOSS fa mide) is a chemotherapy drug. It slows the growth of cancer cells. This medicine is used to treat many types of cancer like lymphoma, myeloma, leukemia, breast cancer, and ovarian cancer, to name a few. This medicine may be used for other purposes; ask your health care provider or pharmacist if you have questions. COMMON BRAND NAME(S): Cytoxan, Neosar What should I tell my health care provider before I take this medicine? They need to know if you have any of these conditions: -blood disorders -history of other chemotherapy -infection -kidney disease -liver disease -recent or ongoing radiation therapy -tumors in the bone marrow -an unusual or allergic reaction to cyclophosphamide, other chemotherapy, other medicines, foods, dyes, or preservatives -pregnant or trying to get pregnant -breast-feeding How should I use this medicine? This drug is usually  given as an injection into a vein or muscle or by infusion into a vein. It is administered in a hospital or clinic by a specially trained health care professional. Talk to your pediatrician regarding the use of this medicine in children. Special care may be needed. Overdosage: If you think you have taken too much of this medicine contact a poison control center or emergency room at once. NOTE: This medicine is only for you. Do not share this medicine with others. What if I miss a dose? It is important not to miss your dose. Call your doctor or health care professional if you are unable to keep an appointment. What may interact with this medicine? This medicine may interact with the following medications: -amiodarone -amphotericin B -azathioprine -certain antiviral medicines for HIV or AIDS such as protease inhibitors (e.g., indinavir, ritonavir) and zidovudine -certain blood pressure medications such as benazepril, captopril, enalapril, fosinopril, lisinopril, moexipril, monopril, perindopril, quinapril, ramipril, trandolapril -certain cancer medications such as anthracyclines (e.g., daunorubicin, doxorubicin), busulfan, cytarabine, paclitaxel, pentostatin, tamoxifen, trastuzumab -certain diuretics such as chlorothiazide, chlorthalidone, hydrochlorothiazide, indapamide, metolazone -certain medicines that treat or prevent blood clots like warfarin -certain muscle relaxants such as succinylcholine -cyclosporine -etanercept -indomethacin -medicines to increase blood counts like filgrastim, pegfilgrastim, sargramostim -medicines used as general anesthesia -metronidazole -natalizumab This list may not describe all possible interactions. Give your health care provider a list of all the medicines, herbs, non-prescription drugs, or dietary supplements you use. Also tell them if you smoke, drink alcohol, or use illegal drugs. Some items may interact with your medicine. What should I watch for while  using this medicine? Visit your doctor for checks on your progress. This drug may make  you feel generally unwell. This is not uncommon, as chemotherapy can affect healthy cells as well as cancer cells. Report any side effects. Continue your course of treatment even though you feel ill unless your doctor tells you to stop. Drink water or other fluids as directed. Urinate often, even at night. In some cases, you may be given additional medicines to help with side effects. Follow all directions for their use. Call your doctor or health care professional for advice if you get a fever, chills or sore throat, or other symptoms of a cold or flu. Do not treat yourself. This drug decreases your body's ability to fight infections. Try to avoid being around people who are sick. This medicine may increase your risk to bruise or bleed. Call your doctor or health care professional if you notice any unusual bleeding. Be careful brushing and flossing your teeth or using a toothpick because you may get an infection or bleed more easily. If you have any dental work done, tell your dentist you are receiving this medicine. You may get drowsy or dizzy. Do not drive, use machinery, or do anything that needs mental alertness until you know how this medicine affects you. Do not become pregnant while taking this medicine or for 1 year after stopping it. Women should inform their doctor if they wish to become pregnant or think they might be pregnant. Men should not father a child while taking this medicine and for 4 months after stopping it. There is a potential for serious side effects to an unborn child. Talk to your health care professional or pharmacist for more information. Do not breast-feed an infant while taking this medicine. This medicine may interfere with the ability to have a child. This medicine has caused ovarian failure in some women. This medicine has caused reduced sperm counts in some men. You should talk with  your doctor or health care professional if you are concerned about your fertility. If you are going to have surgery, tell your doctor or health care professional that you have taken this medicine. What side effects may I notice from receiving this medicine? Side effects that you should report to your doctor or health care professional as soon as possible: -allergic reactions like skin rash, itching or hives, swelling of the face, lips, or tongue -low blood counts - this medicine may decrease the number of white blood cells, red blood cells and platelets. You may be at increased risk for infections and bleeding. -signs of infection - fever or chills, cough, sore throat, pain or difficulty passing urine -signs of decreased platelets or bleeding - bruising, pinpoint red spots on the skin, black, tarry stools, blood in the urine -signs of decreased red blood cells - unusually weak or tired, fainting spells, lightheadedness -breathing problems -dark urine -dizziness -palpitations -swelling of the ankles, feet, hands -trouble passing urine or change in the amount of urine -weight gain -yellowing of the eyes or skin Side effects that usually do not require medical attention (report to your doctor or health care professional if they continue or are bothersome): -changes in nail or skin color -hair loss -missed menstrual periods -mouth sores -nausea, vomiting This list may not describe all possible side effects. Call your doctor for medical advice about side effects. You may report side effects to FDA at 1-800-FDA-1088. Where should I keep my medicine? This drug is given in a hospital or clinic and will not be stored at home. NOTE: This sheet is a summary. It may  not cover all possible information. If you have questions about this medicine, talk to your doctor, pharmacist, or health care provider.  2014, [employer]. (2012-01-30 16:22:58)    Doxorubicin injection What is this  medicine? DOXORUBICIN (dox oh ROO bi sin) is a chemotherapy drug. It is used to treat many kinds of cancer like Hodgkin's disease, leukemia, non-Hodgkin's lymphoma, neuroblastoma, sarcoma, and Wilms' tumor. It is also used to treat bladder cancer, breast cancer, lung cancer, ovarian cancer, stomach cancer, and thyroid cancer. This medicine may be used for other purposes; ask your health care provider or pharmacist if you have questions. COMMON BRAND NAME(S): Adriamycin PFS, Adriamycin RDF, Adriamycin, Rubex What should I tell my health care provider before I take this medicine? They need to know if you have any of these conditions: -blood disorders -heart disease, recent heart attack -infection (especially a virus infection such as chickenpox, cold sores, or herpes) -irregular heartbeat -liver disease -recent or ongoing radiation therapy -an unusual or allergic reaction to doxorubicin, other chemotherapy agents, other medicines, foods, dyes, or preservatives -pregnant or trying to get pregnant -breast-feeding How should I use this medicine? This drug is given as an infusion into a vein. It is administered in a hospital or clinic by a specially trained health care professional. If you have pain, swelling, burning or any unusual feeling around the site of your injection, tell your health care professional right away. Talk to your pediatrician regarding the use of this medicine in children. Special care may be needed. Overdosage: If you think you have taken too much of this medicine contact a poison control center or emergency room at once. NOTE: This medicine is only for you. Do not share this medicine with others. What if I miss a dose? It is important not to miss your dose. Call your doctor or health care professional if you are unable to keep an appointment. What may interact with this medicine? Do not take this medicine with any of the following  medications: -cisapride -droperidol -halofantrine -pimozide -zidovudine This medicine may also interact with the following medications: -chloroquine -chlorpromazine -clarithromycin -cyclophosphamide -cyclosporine -erythromycin -medicines for depression, anxiety, or psychotic disturbances -medicines for irregular heart beat like amiodarone, bepridil, dofetilide, encainide, flecainide, propafenone, quinidine -medicines for seizures like ethotoin, fosphenytoin, phenytoin -medicines for nausea, vomiting like dolasetron, ondansetron, palonosetron -medicines to increase blood counts like filgrastim, pegfilgrastim, sargramostim -methadone -methotrexate -pentamidine -progesterone -vaccines -verapamil Talk to your doctor or health care professional before taking any of these medicines: -acetaminophen -aspirin -ibuprofen -ketoprofen -naproxen This list may not describe all possible interactions. Give your health care provider a list of all the medicines, herbs, non-prescription drugs, or dietary supplements you use. Also tell them if you smoke, drink alcohol, or use illegal drugs. Some items may interact with your medicine. What should I watch for while using this medicine? Your condition will be monitored carefully while you are receiving this medicine. You will need important blood work done while you are taking this medicine. This drug may make you feel generally unwell. This is not uncommon, as chemotherapy can affect healthy cells as well as cancer cells. Report any side effects. Continue your course of treatment even though you feel ill unless your doctor tells you to stop. Your urine may turn red for a few days after your dose. This is not blood. If your urine is dark or brown, call your doctor. In some cases, you may be given additional medicines to help with side effects. Follow all directions for  their use. Call your doctor or health care professional for advice if you get a  fever, chills or sore throat, or other symptoms of a cold or flu. Do not treat yourself. This drug decreases your body's ability to fight infections. Try to avoid being around people who are sick. This medicine may increase your risk to bruise or bleed. Call your doctor or health care professional if you notice any unusual bleeding. Be careful brushing and flossing your teeth or using a toothpick because you may get an infection or bleed more easily. If you have any dental work done, tell your dentist you are receiving this medicine. Avoid taking products that contain aspirin, acetaminophen, ibuprofen, naproxen, or ketoprofen unless instructed by your doctor. These medicines may hide a fever. Men and women of childbearing age should use effective birth control methods while using taking this medicine. Do not become pregnant while taking this medicine. There is a potential for serious side effects to an unborn child. Talk to your health care professional or pharmacist for more information. Do not breast-feed an infant while taking this medicine. Do not let others touch your urine or other body fluids for 5 days after each treatment with this medicine. Caregivers should wear latex gloves to avoid touching body fluids during this time. There is a maximum amount of this medicine you should receive throughout your life. The amount depends on the medical condition being treated and your overall health. Your doctor will watch how much of this medicine you receive in your lifetime. Tell your doctor if you have taken this medicine before. What side effects may I notice from receiving this medicine? Side effects that you should report to your doctor or health care professional as soon as possible: -allergic reactions like skin rash, itching or hives, swelling of the face, lips, or tongue -low blood counts - this medicine may decrease the number of white blood cells, red blood cells and platelets. You may be at  increased risk for infections and bleeding. -signs of infection - fever or chills, cough, sore throat, pain or difficulty passing urine -signs of decreased platelets or bleeding - bruising, pinpoint red spots on the skin, black, tarry stools, blood in the urine -signs of decreased red blood cells - unusually weak or tired, fainting spells, lightheadedness -breathing problems -chest pain -fast, irregular heartbeat -mouth sores -nausea, vomiting -pain, swelling, redness at site where injected -pain, tingling, numbness in the hands or feet -swelling of ankles, feet, or hands -unusual bleeding or bruising Side effects that usually do not require medical attention (report to your doctor or health care professional if they continue or are bothersome): -diarrhea -facial flushing -hair loss -loss of appetite -missed menstrual periods -nail discoloration or damage -red or watery eyes -red colored urine -stomach upset This list may not describe all possible side effects. Call your doctor for medical advice about side effects. You may report side effects to FDA at 1-800-FDA-1088. Where should I keep my medicine? This drug is given in a hospital or clinic and will not be stored at home. NOTE: This sheet is a summary. It may not cover all possible information. If you have questions about this medicine, talk to your doctor, pharmacist, or health care provider.  2014, [employer]. (2012-07-13 09:54:34)

## 2013-04-04 NOTE — Telephone Encounter (Signed)
Per staff message and POF I have scheduled appts.  JMW  
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
## 2013-04-04 NOTE — Telephone Encounter (Signed)
per Dr Jana Hakim orders pt needs weekly labs...done pt aware

## 2013-04-05 ENCOUNTER — Telehealth: Payer: Self-pay | Admitting: *Deleted

## 2013-04-05 ENCOUNTER — Ambulatory Visit (HOSPITAL_BASED_OUTPATIENT_CLINIC_OR_DEPARTMENT_OTHER): Payer: Medicare Other

## 2013-04-05 VITALS — BP 136/57 | HR 65 | Temp 98.5°F

## 2013-04-05 DIAGNOSIS — C50419 Malignant neoplasm of upper-outer quadrant of unspecified female breast: Secondary | ICD-10-CM

## 2013-04-05 DIAGNOSIS — C50411 Malignant neoplasm of upper-outer quadrant of right female breast: Secondary | ICD-10-CM

## 2013-04-05 MED ORDER — PEGFILGRASTIM INJECTION 6 MG/0.6ML
6.0000 mg | Freq: Once | SUBCUTANEOUS | Status: AC
Start: 2013-04-05 — End: 2013-04-05
  Administered 2013-04-05: 6 mg via SUBCUTANEOUS
  Filled 2013-04-05: qty 0.6

## 2013-04-05 NOTE — Patient Instructions (Signed)

## 2013-04-05 NOTE — Telephone Encounter (Signed)
Shannon Grant here for Neulasta injection following 1st ac chemotherapy.  States that she is doing well except for foggy brain and slight nausea.  Reviewed with patient what antiemetics to take.  (compazine, zofran and lorazepam)   She is drinking and eating some.  All questions answered.  She knows to call if she has any problems, concerns, or questions.

## 2013-04-06 ENCOUNTER — Telehealth: Payer: Self-pay | Admitting: *Deleted

## 2013-04-06 NOTE — Telephone Encounter (Signed)
Patient calling in reporting redness on face, chest and upper arms, no bumps, more flush appearance. Informed patient this is likely from the Decadron she is currently taking, today is the last day and she should start to see a decrease in the redness when she comes off this drug. Reviewed all meds with patient, and she verbalized understanding, patient knows to call back with any further questions.

## 2013-04-11 ENCOUNTER — Other Ambulatory Visit: Payer: Self-pay | Admitting: *Deleted

## 2013-04-11 ENCOUNTER — Telehealth: Payer: Self-pay | Admitting: *Deleted

## 2013-04-11 ENCOUNTER — Other Ambulatory Visit (HOSPITAL_BASED_OUTPATIENT_CLINIC_OR_DEPARTMENT_OTHER): Payer: Medicare Other

## 2013-04-11 DIAGNOSIS — D0590 Unspecified type of carcinoma in situ of unspecified breast: Secondary | ICD-10-CM

## 2013-04-11 DIAGNOSIS — C50419 Malignant neoplasm of upper-outer quadrant of unspecified female breast: Secondary | ICD-10-CM | POA: Diagnosis not present

## 2013-04-11 LAB — COMPREHENSIVE METABOLIC PANEL (CC13)
ALT: 13 U/L (ref 0–55)
AST: 10 U/L (ref 5–34)
Albumin: 3.7 g/dL (ref 3.5–5.0)
Alkaline Phosphatase: 55 U/L (ref 40–150)
Anion Gap: 12 mEq/L — ABNORMAL HIGH (ref 3–11)
BUN: 13.8 mg/dL (ref 7.0–26.0)
CO2: 26 meq/L (ref 22–29)
Calcium: 9.6 mg/dL (ref 8.4–10.4)
Chloride: 100 mEq/L (ref 98–109)
Creatinine: 0.8 mg/dL (ref 0.6–1.1)
Glucose: 101 mg/dl (ref 70–140)
Potassium: 3.5 mEq/L (ref 3.5–5.1)
SODIUM: 137 meq/L (ref 136–145)
TOTAL PROTEIN: 6.4 g/dL (ref 6.4–8.3)
Total Bilirubin: 0.93 mg/dL (ref 0.20–1.20)

## 2013-04-11 LAB — CBC WITH DIFFERENTIAL/PLATELET
BASO%: 1.9 % (ref 0.0–2.0)
BASOS ABS: 0 10*3/uL (ref 0.0–0.1)
EOS%: 10.4 % — ABNORMAL HIGH (ref 0.0–7.0)
Eosinophils Absolute: 0 10*3/uL (ref 0.0–0.5)
HEMATOCRIT: 43.9 % (ref 34.8–46.6)
HEMOGLOBIN: 14.7 g/dL (ref 11.6–15.9)
LYMPH#: 0.3 10*3/uL — AB (ref 0.9–3.3)
LYMPH%: 69.5 % — ABNORMAL HIGH (ref 14.0–49.7)
MCH: 29.7 pg (ref 25.1–34.0)
MCHC: 33.6 g/dL (ref 31.5–36.0)
MCV: 88.4 fL (ref 79.5–101.0)
MONO#: 0 10*3/uL — AB (ref 0.1–0.9)
MONO%: 10.3 % (ref 0.0–14.0)
NEUT%: 7.9 % — ABNORMAL LOW (ref 38.4–76.8)
NEUTROS ABS: 0 10*3/uL — AB (ref 1.5–6.5)
Platelets: 185 10*3/uL (ref 145–400)
RBC: 4.96 10*6/uL (ref 3.70–5.45)
RDW: 13.7 % (ref 11.2–14.5)
WBC: 0.5 10*3/uL — AB (ref 3.9–10.3)

## 2013-04-11 MED ORDER — AMOXICILLIN-POT CLAVULANATE 875-125 MG PO TABS: 1.0000 | ORAL_TABLET | Freq: Two times a day (BID) | ORAL | Status: DC

## 2013-04-11 NOTE — Telephone Encounter (Signed)
This RN spoke with pt per lab this am for d7 cycle 1 follow up.  Informed pt of low wbc and need for prophylactic prescription for antibiotic per MD.  Per phone discussion, Shannon Grant states primary side effect of concern was severe constipation which she was finally able to be relieved with use of " smooth move". Note Shannon Grant states " constipation was so severe that when I finally had a BM yesterday it was all day, loose and so bad it caused my hemorrhoids to be irritated"   Above reviewed with MD who suggested pt use topical analgesics due to low WBC, including use of sitz bath.  This RN called and discussed above with pt including prescription for antibiotic sent to pharmacy. Appointment date and time given for MD follow up.

## 2013-04-12 ENCOUNTER — Telehealth: Payer: Self-pay | Admitting: Oncology

## 2013-04-12 ENCOUNTER — Ambulatory Visit (HOSPITAL_BASED_OUTPATIENT_CLINIC_OR_DEPARTMENT_OTHER): Payer: Medicare Other | Admitting: Oncology

## 2013-04-12 VITALS — BP 144/81 | HR 72 | Temp 98.7°F | Resp 18 | Ht 64.0 in | Wt 165.3 lb

## 2013-04-12 DIAGNOSIS — Z171 Estrogen receptor negative status [ER-]: Secondary | ICD-10-CM

## 2013-04-12 DIAGNOSIS — C50419 Malignant neoplasm of upper-outer quadrant of unspecified female breast: Secondary | ICD-10-CM | POA: Diagnosis not present

## 2013-04-12 DIAGNOSIS — C50411 Malignant neoplasm of upper-outer quadrant of right female breast: Secondary | ICD-10-CM

## 2013-04-12 MED ORDER — DOXYCYCLINE HYCLATE 100 MG PO TABS: 100.0000 mg | ORAL_TABLET | Freq: Two times a day (BID) | ORAL | Status: DC

## 2013-04-12 NOTE — Telephone Encounter (Signed)
, °
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
## 2013-04-14 ENCOUNTER — Other Ambulatory Visit: Payer: Self-pay | Admitting: Oncology

## 2013-04-18 ENCOUNTER — Other Ambulatory Visit: Payer: Medicare Other

## 2013-04-18 ENCOUNTER — Other Ambulatory Visit: Payer: Self-pay | Admitting: Oncology

## 2013-04-18 ENCOUNTER — Ambulatory Visit (HOSPITAL_BASED_OUTPATIENT_CLINIC_OR_DEPARTMENT_OTHER): Payer: Medicare Other

## 2013-04-18 ENCOUNTER — Encounter: Payer: Self-pay | Admitting: Physician Assistant

## 2013-04-18 ENCOUNTER — Ambulatory Visit (HOSPITAL_BASED_OUTPATIENT_CLINIC_OR_DEPARTMENT_OTHER): Payer: Medicare Other | Admitting: Physician Assistant

## 2013-04-18 ENCOUNTER — Other Ambulatory Visit (HOSPITAL_BASED_OUTPATIENT_CLINIC_OR_DEPARTMENT_OTHER): Payer: Medicare Other

## 2013-04-18 ENCOUNTER — Telehealth: Payer: Self-pay | Admitting: Physician Assistant

## 2013-04-18 ENCOUNTER — Ambulatory Visit: Payer: Medicare Other

## 2013-04-18 VITALS — BP 131/80 | HR 76 | Temp 97.9°F | Resp 18 | Ht 64.0 in | Wt 167.2 lb

## 2013-04-18 DIAGNOSIS — D0590 Unspecified type of carcinoma in situ of unspecified breast: Secondary | ICD-10-CM

## 2013-04-18 DIAGNOSIS — C50419 Malignant neoplasm of upper-outer quadrant of unspecified female breast: Secondary | ICD-10-CM

## 2013-04-18 DIAGNOSIS — Z5111 Encounter for antineoplastic chemotherapy: Secondary | ICD-10-CM

## 2013-04-18 DIAGNOSIS — Z171 Estrogen receptor negative status [ER-]: Secondary | ICD-10-CM | POA: Diagnosis not present

## 2013-04-18 DIAGNOSIS — C50411 Malignant neoplasm of upper-outer quadrant of right female breast: Secondary | ICD-10-CM

## 2013-04-18 LAB — CBC WITH DIFFERENTIAL/PLATELET
BASO%: 0.9 % (ref 0.0–2.0)
BASOS ABS: 0.1 10*3/uL (ref 0.0–0.1)
EOS%: 0.1 % (ref 0.0–7.0)
Eosinophils Absolute: 0 10*3/uL (ref 0.0–0.5)
HCT: 39.3 % (ref 34.8–46.6)
HGB: 13.1 g/dL (ref 11.6–15.9)
LYMPH%: 7 % — AB (ref 14.0–49.7)
MCH: 28.8 pg (ref 25.1–34.0)
MCHC: 33.3 g/dL (ref 31.5–36.0)
MCV: 86.4 fL (ref 79.5–101.0)
MONO#: 1 10*3/uL — ABNORMAL HIGH (ref 0.1–0.9)
MONO%: 8.8 % (ref 0.0–14.0)
NEUT#: 9.6 10*3/uL — ABNORMAL HIGH (ref 1.5–6.5)
NEUT%: 83.2 % — AB (ref 38.4–76.8)
PLATELETS: 293 10*3/uL (ref 145–400)
RBC: 4.55 10*6/uL (ref 3.70–5.45)
RDW: 14 % (ref 11.2–14.5)
WBC: 11.6 10*3/uL — ABNORMAL HIGH (ref 3.9–10.3)
lymph#: 0.8 10*3/uL — ABNORMAL LOW (ref 0.9–3.3)
nRBC: 0 % (ref 0–0)

## 2013-04-18 MED ORDER — PALONOSETRON HCL INJECTION 0.25 MG/5ML
INTRAVENOUS | Status: AC
  Filled 2013-04-18: qty 5

## 2013-04-18 MED ORDER — DEXAMETHASONE SODIUM PHOSPHATE 20 MG/5ML IJ SOLN
INTRAMUSCULAR | Status: AC
  Filled 2013-04-18: qty 5

## 2013-04-18 MED ORDER — SODIUM CHLORIDE 0.9 % IV SOLN
Freq: Once | INTRAVENOUS | Status: AC
  Administered 2013-04-18: 11:00:00 via INTRAVENOUS

## 2013-04-18 MED ORDER — DEXAMETHASONE SODIUM PHOSPHATE 20 MG/5ML IJ SOLN
12.0000 mg | Freq: Once | INTRAMUSCULAR | Status: AC
  Administered 2013-04-18: 12 mg via INTRAVENOUS

## 2013-04-18 MED ORDER — SODIUM CHLORIDE 0.9 % IJ SOLN
10.0000 mL | INTRAMUSCULAR | Status: DC | PRN
  Administered 2013-04-18: 10 mL
  Filled 2013-04-18: qty 10

## 2013-04-18 MED ORDER — PALONOSETRON HCL INJECTION 0.25 MG/5ML
0.2500 mg | Freq: Once | INTRAVENOUS | Status: AC
  Administered 2013-04-18: 0.25 mg via INTRAVENOUS

## 2013-04-18 MED ORDER — DOXORUBICIN HCL CHEMO IV INJECTION 2 MG/ML
60.0000 mg/m2 | Freq: Once | INTRAVENOUS | Status: AC
  Administered 2013-04-18: 112 mg via INTRAVENOUS
  Filled 2013-04-18: qty 56

## 2013-04-18 MED ORDER — SODIUM CHLORIDE 0.9 % IV SOLN
150.0000 mg | Freq: Once | INTRAVENOUS | Status: AC
  Administered 2013-04-18: 150 mg via INTRAVENOUS
  Filled 2013-04-18: qty 5

## 2013-04-18 MED ORDER — HEPARIN SOD (PORK) LOCK FLUSH 100 UNIT/ML IV SOLN
500.0000 [IU] | Freq: Once | INTRAVENOUS | Status: AC | PRN
  Administered 2013-04-18: 500 [IU]
  Filled 2013-04-18: qty 5

## 2013-04-18 MED ORDER — SODIUM CHLORIDE 0.9 % IV SOLN
600.0000 mg/m2 | Freq: Once | INTRAVENOUS | Status: AC
  Administered 2013-04-18: 1120 mg via INTRAVENOUS
  Filled 2013-04-18: qty 56
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
## 2013-04-18 NOTE — Patient Instructions (Signed)
[facility] Discharge Instructions for Patients Receiving Chemotherapy  Today you received the following chemotherapy agents Adriamycin, Cytoxan.  To help prevent nausea and vomiting after your treatment, we encourage you to take your nausea medication Zofran 8 mg twice daily beginning 04-21-2012 then as needed and Other compazine 10 mg every 6 hours as needed for nausea or vomiting.   If you develop nausea and vomiting that is not controlled by your nausea medication, call the clinic.   BELOW ARE SYMPTOMS THAT SHOULD BE REPORTED IMMEDIATELY:  *FEVER GREATER THAN 100.5 F  *CHILLS WITH OR WITHOUT FEVER  NAUSEA AND VOMITING THAT IS NOT CONTROLLED WITH YOUR NAUSEA MEDICATION  *UNUSUAL SHORTNESS OF BREATH  *UNUSUAL BRUISING OR BLEEDING  TENDERNESS IN MOUTH AND THROAT WITH OR WITHOUT PRESENCE OF ULCERS  *URINARY PROBLEMS  *BOWEL PROBLEMS  UNUSUAL RASH Items with * indicate a potential emergency and should be followed up as soon as possible.  Feel free to call the clinic you have any questions or concerns. The clinic phone number is [phone].    

## 2013-04-18 NOTE — Progress Notes (Signed)
Discharged at 19 with daughter, ambulatory in no distress.

## 2013-04-19 ENCOUNTER — Ambulatory Visit (HOSPITAL_BASED_OUTPATIENT_CLINIC_OR_DEPARTMENT_OTHER): Payer: Medicare Other

## 2013-04-19 VITALS — BP 120/56 | HR 67 | Temp 98.6°F

## 2013-04-19 DIAGNOSIS — C50419 Malignant neoplasm of upper-outer quadrant of unspecified female breast: Secondary | ICD-10-CM

## 2013-04-19 DIAGNOSIS — C50411 Malignant neoplasm of upper-outer quadrant of right female breast: Secondary | ICD-10-CM

## 2013-04-19 MED ORDER — PEGFILGRASTIM INJECTION 6 MG/0.6ML
6.0000 mg | Freq: Once | SUBCUTANEOUS | Status: AC
  Administered 2013-04-19: 6 mg via SUBCUTANEOUS
  Filled 2013-04-19: qty 0.6

## 2013-04-19 NOTE — Patient Instructions (Signed)

## 2013-04-21 ENCOUNTER — Telehealth: Payer: Self-pay | Admitting: *Deleted

## 2013-04-21 NOTE — Telephone Encounter (Signed)
Pt left message stating noted " took my dexamethesone and look like the incredible Hulk " -  She also stated she is not nauseated therefore has not taken the ondansetron and wanted to verify she is not doing anything detrimental.  This RN returned call to patient and discussed post chemo medications and side effects.  Informed her facial flushing is a normal side effect from the steroid and is self limiting and non threatening. Discussed use of ondansetron with pt stating noted constipation was very severe with previous use " but again I do not want to do anything detrimentral ".  Pt has not had any nausea-   This RN discussed with her concern for nausea secondary to chemo and the ondansetron is for possible nausea based on outcome of majority of pt under same therapy.  Pt understands to treat nausea at onset for best outcome.  Shannon Grant also inquired about decreasing the decadron and possible outcome including " concern that my WBC count crashed ". This RN explained to pt prescribed post chemo medications is to decrease known potential side effects of chemo. Not taking them as prescribed puts her at risk for these symptoms or if she stops the decadron abruptly she could have increased fatique -  Best at present to continue the decadron and use the antinausea at first sign of nausea and then take stool softener/laxative if needed.    She

## 2013-04-21 NOTE — Telephone Encounter (Signed)
Completion of previous entry per computer closed out while documenting.  Pt concern relating to " crash " of WBC's is not dependent on the steroid but how her body responds to the chemo and when it responds to the neulasta.  Pt may need to be on prophylactic antibodies ( pt was previously placed on augmentin due to cipro allergy ) and this RN will review pt's concerns with MD and inquire if ABX needed prior to follow up appointment.  Pt verbalized understanding.  Pt's concerns will be given to MD for review and recommendations.

## 2013-04-25 ENCOUNTER — Ambulatory Visit (HOSPITAL_BASED_OUTPATIENT_CLINIC_OR_DEPARTMENT_OTHER): Payer: Medicare Other | Admitting: Hematology and Oncology

## 2013-04-25 ENCOUNTER — Other Ambulatory Visit (HOSPITAL_BASED_OUTPATIENT_CLINIC_OR_DEPARTMENT_OTHER): Payer: Medicare Other

## 2013-04-25 ENCOUNTER — Other Ambulatory Visit: Payer: Medicare Other

## 2013-04-25 VITALS — BP 126/76 | HR 74 | Temp 98.8°F | Resp 18 | Ht 64.0 in | Wt 164.7 lb

## 2013-04-25 DIAGNOSIS — D0590 Unspecified type of carcinoma in situ of unspecified breast: Secondary | ICD-10-CM

## 2013-04-25 DIAGNOSIS — C50419 Malignant neoplasm of upper-outer quadrant of unspecified female breast: Secondary | ICD-10-CM | POA: Diagnosis not present

## 2013-04-25 DIAGNOSIS — C50411 Malignant neoplasm of upper-outer quadrant of right female breast: Secondary | ICD-10-CM

## 2013-04-25 LAB — CBC WITH DIFFERENTIAL/PLATELET
BASO%: 6.9 % — AB (ref 0.0–2.0)
Basophils Absolute: 0 10*3/uL (ref 0.0–0.1)
EOS%: 3.4 % (ref 0.0–7.0)
Eosinophils Absolute: 0 10*3/uL (ref 0.0–0.5)
HCT: 38.1 % (ref 34.8–46.6)
HGB: 12.6 g/dL (ref 11.6–15.9)
LYMPH#: 0.2 10*3/uL — AB (ref 0.9–3.3)
LYMPH%: 69 % — ABNORMAL HIGH (ref 14.0–49.7)
MCH: 28.5 pg (ref 25.1–34.0)
MCHC: 33.1 g/dL (ref 31.5–36.0)
MCV: 86.2 fL (ref 79.5–101.0)
MONO#: 0 10*3/uL — AB (ref 0.1–0.9)
MONO%: 13.8 % (ref 0.0–14.0)
NEUT#: 0 10*3/uL — CL (ref 1.5–6.5)
NEUT%: 6.9 % — ABNORMAL LOW (ref 38.4–76.8)
Platelets: 209 10*3/uL (ref 145–400)
RBC: 4.42 10*6/uL (ref 3.70–5.45)
RDW: 13.8 % (ref 11.2–14.5)
WBC: 0.3 10*3/uL — AB (ref 3.9–10.3)
nRBC: 0 % (ref 0–0)

## 2013-04-25 MED ORDER — AMOXICILLIN-POT CLAVULANATE 875-125 MG PO TABS: 875.0000 mg | ORAL_TABLET | Freq: Two times a day (BID) | ORAL | Status: DC
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
## 2013-05-02 ENCOUNTER — Ambulatory Visit (HOSPITAL_BASED_OUTPATIENT_CLINIC_OR_DEPARTMENT_OTHER): Payer: Medicare Other

## 2013-05-02 ENCOUNTER — Ambulatory Visit (HOSPITAL_BASED_OUTPATIENT_CLINIC_OR_DEPARTMENT_OTHER): Payer: Medicare Other | Admitting: Hematology and Oncology

## 2013-05-02 ENCOUNTER — Other Ambulatory Visit (HOSPITAL_BASED_OUTPATIENT_CLINIC_OR_DEPARTMENT_OTHER): Payer: Medicare Other

## 2013-05-02 VITALS — BP 134/78 | HR 81 | Temp 99.0°F | Resp 18 | Ht 64.0 in | Wt 164.0 lb

## 2013-05-02 DIAGNOSIS — C50419 Malignant neoplasm of upper-outer quadrant of unspecified female breast: Secondary | ICD-10-CM

## 2013-05-02 DIAGNOSIS — C50919 Malignant neoplasm of unspecified site of unspecified female breast: Secondary | ICD-10-CM

## 2013-05-02 DIAGNOSIS — Z171 Estrogen receptor negative status [ER-]: Secondary | ICD-10-CM | POA: Diagnosis not present

## 2013-05-02 DIAGNOSIS — K117 Disturbances of salivary secretion: Secondary | ICD-10-CM

## 2013-05-02 DIAGNOSIS — C50411 Malignant neoplasm of upper-outer quadrant of right female breast: Secondary | ICD-10-CM

## 2013-05-02 DIAGNOSIS — Z5111 Encounter for antineoplastic chemotherapy: Secondary | ICD-10-CM | POA: Diagnosis not present

## 2013-05-02 DIAGNOSIS — D0590 Unspecified type of carcinoma in situ of unspecified breast: Secondary | ICD-10-CM

## 2013-05-02 LAB — CBC WITH DIFFERENTIAL/PLATELET
BASO%: 0.6 % (ref 0.0–2.0)
Basophils Absolute: 0.1 10*3/uL (ref 0.0–0.1)
EOS ABS: 0 10*3/uL (ref 0.0–0.5)
EOS%: 0.1 % (ref 0.0–7.0)
HCT: 36.6 % (ref 34.8–46.6)
HGB: 12.3 g/dL (ref 11.6–15.9)
LYMPH#: 0.5 10*3/uL — AB (ref 0.9–3.3)
LYMPH%: 4.4 % — AB (ref 14.0–49.7)
MCH: 29 pg (ref 25.1–34.0)
MCHC: 33.7 g/dL (ref 31.5–36.0)
MCV: 86 fL (ref 79.5–101.0)
MONO#: 1.1 10*3/uL — AB (ref 0.1–0.9)
MONO%: 10 % (ref 0.0–14.0)
NEUT%: 84.9 % — ABNORMAL HIGH (ref 38.4–76.8)
NEUTROS ABS: 9.5 10*3/uL — AB (ref 1.5–6.5)
PLATELETS: 314 10*3/uL (ref 145–400)
RBC: 4.26 10*6/uL (ref 3.70–5.45)
RDW: 14.3 % (ref 11.2–14.5)
WBC: 11.2 10*3/uL — AB (ref 3.9–10.3)

## 2013-05-02 LAB — COMPREHENSIVE METABOLIC PANEL (CC13)
ALBUMIN: 3.4 g/dL — AB (ref 3.5–5.0)
ALT: 15 U/L (ref 0–55)
ANION GAP: 12 meq/L — AB (ref 3–11)
AST: 14 U/L (ref 5–34)
Alkaline Phosphatase: 65 U/L (ref 40–150)
BILIRUBIN TOTAL: 0.32 mg/dL (ref 0.20–1.20)
BUN: 9 mg/dL (ref 7.0–26.0)
CO2: 28 meq/L (ref 22–29)
Calcium: 10.1 mg/dL (ref 8.4–10.4)
Chloride: 103 mEq/L (ref 98–109)
Creatinine: 0.7 mg/dL (ref 0.6–1.1)
GLUCOSE: 98 mg/dL (ref 70–140)
POTASSIUM: 3.5 meq/L (ref 3.5–5.1)
Sodium: 143 mEq/L (ref 136–145)
Total Protein: 6.5 g/dL (ref 6.4–8.3)

## 2013-05-02 MED ORDER — SODIUM CHLORIDE 0.9 % IV SOLN
Freq: Once | INTRAVENOUS | Status: AC
  Administered 2013-05-02: 10:00:00 via INTRAVENOUS

## 2013-05-02 MED ORDER — OMEPRAZOLE 20 MG PO CPDR: 20.0000 mg | DELAYED_RELEASE_CAPSULE | Freq: Every day | ORAL | Status: DC

## 2013-05-02 MED ORDER — DOXORUBICIN HCL CHEMO IV INJECTION 2 MG/ML
60.0000 mg/m2 | Freq: Once | INTRAVENOUS | Status: AC
  Administered 2013-05-02: 112 mg via INTRAVENOUS
  Filled 2013-05-02: qty 56

## 2013-05-02 MED ORDER — DEXAMETHASONE SODIUM PHOSPHATE 20 MG/5ML IJ SOLN
12.0000 mg | Freq: Once | INTRAMUSCULAR | Status: AC
  Administered 2013-05-02: 12 mg via INTRAVENOUS

## 2013-05-02 MED ORDER — SODIUM CHLORIDE 0.9 % IV SOLN
150.0000 mg | Freq: Once | INTRAVENOUS | Status: AC
  Administered 2013-05-02: 150 mg via INTRAVENOUS
  Filled 2013-05-02: qty 5

## 2013-05-02 MED ORDER — SODIUM CHLORIDE 0.9 % IJ SOLN
10.0000 mL | INTRAMUSCULAR | Status: DC | PRN
  Administered 2013-05-02: 10 mL
  Filled 2013-05-02: qty 10

## 2013-05-02 MED ORDER — SODIUM CHLORIDE 0.9 % IV SOLN
600.0000 mg/m2 | Freq: Once | INTRAVENOUS | Status: AC
  Administered 2013-05-02: 1120 mg via INTRAVENOUS
  Filled 2013-05-02: qty 56

## 2013-05-02 MED ORDER — DEXAMETHASONE SODIUM PHOSPHATE 20 MG/5ML IJ SOLN
INTRAMUSCULAR | Status: AC
  Filled 2013-05-02: qty 5

## 2013-05-02 MED ORDER — HEPARIN SOD (PORK) LOCK FLUSH 100 UNIT/ML IV SOLN
500.0000 [IU] | Freq: Once | INTRAVENOUS | Status: AC | PRN
  Administered 2013-05-02: 500 [IU]
  Filled 2013-05-02: qty 5

## 2013-05-02 MED ORDER — PALONOSETRON HCL INJECTION 0.25 MG/5ML
0.2500 mg | Freq: Once | INTRAVENOUS | Status: AC
  Administered 2013-05-02: 0.25 mg via INTRAVENOUS

## 2013-05-02 MED ORDER — PALONOSETRON HCL INJECTION 0.25 MG/5ML
INTRAVENOUS | Status: AC
  Filled 2013-05-02: qty 5

## 2013-05-02 NOTE — Patient Instructions (Signed)
[facility] Discharge Instructions for Patients Receiving Chemotherapy  Today you received the following chemotherapy agents Adriamycin, Cytoxan.  To help prevent nausea and vomiting after your treatment, we encourage you to take your nausea medication Zofran 8 mg twice daily beginning 04-21-2012 then as needed and Other compazine 10 mg every 6 hours as needed for nausea or vomiting.   If you develop nausea and vomiting that is not controlled by your nausea medication, call the clinic.   BELOW ARE SYMPTOMS THAT SHOULD BE REPORTED IMMEDIATELY:  *FEVER GREATER THAN 100.5 F  *CHILLS WITH OR WITHOUT FEVER  NAUSEA AND VOMITING THAT IS NOT CONTROLLED WITH YOUR NAUSEA MEDICATION  *UNUSUAL SHORTNESS OF BREATH  *UNUSUAL BRUISING OR BLEEDING  TENDERNESS IN MOUTH AND THROAT WITH OR WITHOUT PRESENCE OF ULCERS  *URINARY PROBLEMS  *BOWEL PROBLEMS  UNUSUAL RASH Items with * indicate a potential emergency and should be followed up as soon as possible.  Feel free to call the clinic you have any questions or concerns. The clinic phone number is [phone].
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
## 2013-05-03 ENCOUNTER — Encounter (HOSPITAL_COMMUNITY): Payer: Self-pay | Admitting: Hematology and Oncology

## 2013-05-03 ENCOUNTER — Ambulatory Visit (HOSPITAL_BASED_OUTPATIENT_CLINIC_OR_DEPARTMENT_OTHER): Payer: Medicare Other

## 2013-05-03 ENCOUNTER — Telehealth: Payer: Self-pay | Admitting: *Deleted

## 2013-05-03 VITALS — BP 123/52 | HR 68 | Temp 98.4°F

## 2013-05-03 DIAGNOSIS — C50419 Malignant neoplasm of upper-outer quadrant of unspecified female breast: Secondary | ICD-10-CM | POA: Diagnosis not present

## 2013-05-03 DIAGNOSIS — C50411 Malignant neoplasm of upper-outer quadrant of right female breast: Secondary | ICD-10-CM

## 2013-05-03 MED ORDER — PEGFILGRASTIM INJECTION 6 MG/0.6ML
6.0000 mg | Freq: Once | SUBCUTANEOUS | Status: AC
  Administered 2013-05-03: 6 mg via SUBCUTANEOUS
  Filled 2013-05-03: qty 0.6

## 2013-05-03 NOTE — Telephone Encounter (Signed)
Pt called to this RN to state noted new side effects post " this round of chemo that I have not experienced before ".  Pt had cycle 3 yesterday.  Per conversation Shannon Grant states " I slept from 5pm to 7am - I did wake up to drink and use the bathroom but went right back to bed " " I also woke up at one time profusely sweaty from my chest up- with a wet top and had to change the sheets on the bed ".  This RN discussed above symptoms with further inquiry.  Pt denies any fever, chills or rigors.  Per conversation pt is able to rehydrate well.  She understands above is related to chemo including sweating likely secondary to steroid used for premed.  Shannon Grant will call with any additional concerns.  This note will be given to MD for review.

## 2013-05-06 ENCOUNTER — Encounter: Payer: Self-pay | Admitting: *Deleted

## 2013-05-09 ENCOUNTER — Other Ambulatory Visit (HOSPITAL_BASED_OUTPATIENT_CLINIC_OR_DEPARTMENT_OTHER): Payer: Medicare Other

## 2013-05-09 ENCOUNTER — Ambulatory Visit (HOSPITAL_BASED_OUTPATIENT_CLINIC_OR_DEPARTMENT_OTHER): Payer: Medicare Other | Admitting: Physician Assistant

## 2013-05-09 VITALS — BP 132/79 | HR 73 | Temp 99.1°F | Resp 18 | Ht 64.0 in | Wt 162.3 lb

## 2013-05-09 DIAGNOSIS — D702 Other drug-induced agranulocytosis: Secondary | ICD-10-CM | POA: Insufficient documentation

## 2013-05-09 DIAGNOSIS — K5900 Constipation, unspecified: Secondary | ICD-10-CM

## 2013-05-09 DIAGNOSIS — Z171 Estrogen receptor negative status [ER-]: Secondary | ICD-10-CM

## 2013-05-09 DIAGNOSIS — C50419 Malignant neoplasm of upper-outer quadrant of unspecified female breast: Secondary | ICD-10-CM

## 2013-05-09 DIAGNOSIS — C50411 Malignant neoplasm of upper-outer quadrant of right female breast: Secondary | ICD-10-CM

## 2013-05-09 DIAGNOSIS — D0590 Unspecified type of carcinoma in situ of unspecified breast: Secondary | ICD-10-CM

## 2013-05-09 DIAGNOSIS — D649 Anemia, unspecified: Secondary | ICD-10-CM | POA: Insufficient documentation

## 2013-05-09 LAB — CBC WITH DIFFERENTIAL/PLATELET
HCT: 34.3 % — ABNORMAL LOW (ref 34.8–46.6)
HEMOGLOBIN: 11.3 g/dL — AB (ref 11.6–15.9)
MCH: 28.3 pg (ref 25.1–34.0)
MCHC: 32.9 g/dL (ref 31.5–36.0)
MCV: 86 fL (ref 79.5–101.0)
PLATELETS: 204 10*3/uL (ref 145–400)
RBC: 3.99 10*6/uL (ref 3.70–5.45)
RDW: 14.3 % (ref 11.2–14.5)
WBC: 0.3 10*3/uL — CL (ref 3.9–10.3)

## 2013-05-09 LAB — COMPREHENSIVE METABOLIC PANEL (CC13)
ALBUMIN: 3.6 g/dL (ref 3.5–5.0)
ALT: 13 U/L (ref 0–55)
AST: 14 U/L (ref 5–34)
Alkaline Phosphatase: 58 U/L (ref 40–150)
Anion Gap: 9 mEq/L (ref 3–11)
BUN: 14.5 mg/dL (ref 7.0–26.0)
CO2: 29 mEq/L (ref 22–29)
Calcium: 9.8 mg/dL (ref 8.4–10.4)
Chloride: 101 mEq/L (ref 98–109)
Creatinine: 0.7 mg/dL (ref 0.6–1.1)
GLUCOSE: 95 mg/dL (ref 70–140)
POTASSIUM: 4 meq/L (ref 3.5–5.1)
SODIUM: 140 meq/L (ref 136–145)
TOTAL PROTEIN: 6 g/dL — AB (ref 6.4–8.3)
Total Bilirubin: 0.5 mg/dL (ref 0.20–1.20)

## 2013-05-09 LAB — MANUAL DIFFERENTIAL
ALC: 0.2 10*3/uL — ABNORMAL LOW (ref 0.9–3.3)
ANC (CHCC MAN DIFF): 0 10*3/uL — AB (ref 1.5–6.5)
BAND NEUTROPHILS: 1 % (ref 0–10)
Basophil: 11 % — ABNORMAL HIGH (ref 0–2)
LYMPH: 64 % — AB (ref 14–49)
MONO: 8 % (ref 0–14)
PLT EST: ADEQUATE
SEG: 16 % — ABNORMAL LOW (ref 38–77)

## 2013-05-09 MED ORDER — AMOXICILLIN-POT CLAVULANATE 875-125 MG PO TABS: 875.0000 mg | ORAL_TABLET | Freq: Two times a day (BID) | ORAL | Status: DC
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
## 2013-05-16 ENCOUNTER — Other Ambulatory Visit (HOSPITAL_BASED_OUTPATIENT_CLINIC_OR_DEPARTMENT_OTHER): Payer: Medicare Other

## 2013-05-16 ENCOUNTER — Ambulatory Visit (HOSPITAL_BASED_OUTPATIENT_CLINIC_OR_DEPARTMENT_OTHER): Payer: Medicare Other

## 2013-05-16 ENCOUNTER — Ambulatory Visit (HOSPITAL_BASED_OUTPATIENT_CLINIC_OR_DEPARTMENT_OTHER): Payer: Medicare Other | Admitting: Hematology and Oncology

## 2013-05-16 VITALS — BP 120/73 | HR 79 | Temp 98.3°F | Resp 18 | Ht 64.0 in | Wt 160.1 lb

## 2013-05-16 DIAGNOSIS — D0590 Unspecified type of carcinoma in situ of unspecified breast: Secondary | ICD-10-CM

## 2013-05-16 DIAGNOSIS — C50411 Malignant neoplasm of upper-outer quadrant of right female breast: Secondary | ICD-10-CM

## 2013-05-16 DIAGNOSIS — C50419 Malignant neoplasm of upper-outer quadrant of unspecified female breast: Secondary | ICD-10-CM | POA: Diagnosis not present

## 2013-05-16 DIAGNOSIS — Z171 Estrogen receptor negative status [ER-]: Secondary | ICD-10-CM | POA: Diagnosis not present

## 2013-05-16 DIAGNOSIS — Z5111 Encounter for antineoplastic chemotherapy: Secondary | ICD-10-CM | POA: Diagnosis not present

## 2013-05-16 LAB — CBC WITH DIFFERENTIAL/PLATELET
BASO%: 0.8 % (ref 0.0–2.0)
Basophils Absolute: 0.2 10*3/uL — ABNORMAL HIGH (ref 0.0–0.1)
EOS%: 0.1 % (ref 0.0–7.0)
Eosinophils Absolute: 0 10*3/uL (ref 0.0–0.5)
HCT: 34.3 % — ABNORMAL LOW (ref 34.8–46.6)
HEMOGLOBIN: 11.5 g/dL — AB (ref 11.6–15.9)
LYMPH#: 0.4 10*3/uL — AB (ref 0.9–3.3)
LYMPH%: 2.4 % — ABNORMAL LOW (ref 14.0–49.7)
MCH: 28.7 pg (ref 25.1–34.0)
MCHC: 33.5 g/dL (ref 31.5–36.0)
MCV: 85.5 fL (ref 79.5–101.0)
MONO#: 1.7 10*3/uL — ABNORMAL HIGH (ref 0.1–0.9)
MONO%: 9.6 % (ref 0.0–14.0)
NEUT#: 15.5 10*3/uL — ABNORMAL HIGH (ref 1.5–6.5)
NEUT%: 87.1 % — ABNORMAL HIGH (ref 38.4–76.8)
NRBC: 0 % (ref 0–0)
Platelets: 232 10*3/uL (ref 145–400)
RBC: 4.01 10*6/uL (ref 3.70–5.45)
RDW: 15.1 % — AB (ref 11.2–14.5)
WBC: 17.8 10*3/uL — ABNORMAL HIGH (ref 3.9–10.3)

## 2013-05-16 MED ORDER — DOXORUBICIN HCL CHEMO IV INJECTION 2 MG/ML
60.0000 mg/m2 | Freq: Once | INTRAVENOUS | Status: AC
  Administered 2013-05-16: 112 mg via INTRAVENOUS
  Filled 2013-05-16: qty 56

## 2013-05-16 MED ORDER — SODIUM CHLORIDE 0.9 % IJ SOLN
10.0000 mL | INTRAMUSCULAR | Status: DC | PRN
  Administered 2013-05-16: 10 mL
  Filled 2013-05-16: qty 10

## 2013-05-16 MED ORDER — PALONOSETRON HCL INJECTION 0.25 MG/5ML
INTRAVENOUS | Status: AC
  Filled 2013-05-16: qty 5

## 2013-05-16 MED ORDER — DEXAMETHASONE SODIUM PHOSPHATE 20 MG/5ML IJ SOLN
12.0000 mg | Freq: Once | INTRAMUSCULAR | Status: AC
  Administered 2013-05-16: 12 mg via INTRAVENOUS

## 2013-05-16 MED ORDER — HEPARIN SOD (PORK) LOCK FLUSH 100 UNIT/ML IV SOLN
500.0000 [IU] | Freq: Once | INTRAVENOUS | Status: AC | PRN
  Administered 2013-05-16: 500 [IU]
  Filled 2013-05-16: qty 5

## 2013-05-16 MED ORDER — SODIUM CHLORIDE 0.9 % IV SOLN
150.0000 mg | Freq: Once | INTRAVENOUS | Status: AC
  Administered 2013-05-16: 150 mg via INTRAVENOUS
  Filled 2013-05-16: qty 5

## 2013-05-16 MED ORDER — SODIUM CHLORIDE 0.9 % IV SOLN
600.0000 mg/m2 | Freq: Once | INTRAVENOUS | Status: AC
  Administered 2013-05-16: 1120 mg via INTRAVENOUS
  Filled 2013-05-16: qty 56

## 2013-05-16 MED ORDER — DEXAMETHASONE SODIUM PHOSPHATE 20 MG/5ML IJ SOLN
INTRAMUSCULAR | Status: AC
  Filled 2013-05-16: qty 5

## 2013-05-16 MED ORDER — SODIUM CHLORIDE 0.9 % IV SOLN
Freq: Once | INTRAVENOUS | Status: AC
  Administered 2013-05-16: 10:00:00 via INTRAVENOUS

## 2013-05-16 MED ORDER — PALONOSETRON HCL INJECTION 0.25 MG/5ML
0.2500 mg | Freq: Once | INTRAVENOUS | Status: AC
  Administered 2013-05-16: 0.25 mg via INTRAVENOUS

## 2013-05-16 NOTE — Patient Instructions (Signed)
[facility] Discharge Instructions for Patients Receiving Chemotherapy  Today you received the following chemotherapy agents: adriamycin, cytoxan  To help prevent nausea and vomiting after your treatment, we encourage you to take your nausea medication.  Take it as often as prescribed.     If you develop nausea and vomiting that is not controlled by your nausea medication, call the clinic. If it is after clinic hours your family physician or the after hours number for the clinic or go to the Emergency Department.   BELOW ARE SYMPTOMS THAT SHOULD BE REPORTED IMMEDIATELY:  *FEVER GREATER THAN 100.5 F  *CHILLS WITH OR WITHOUT FEVER  NAUSEA AND VOMITING THAT IS NOT CONTROLLED WITH YOUR NAUSEA MEDICATION  *UNUSUAL SHORTNESS OF BREATH  *UNUSUAL BRUISING OR BLEEDING  TENDERNESS IN MOUTH AND THROAT WITH OR WITHOUT PRESENCE OF ULCERS  *URINARY PROBLEMS  *BOWEL PROBLEMS  UNUSUAL RASH Items with * indicate a potential emergency and should be followed up as soon as possible.  Feel free to call the clinic you have any questions or concerns. The clinic phone number is [phone].   I have been informed and understand all the instructions given to me. I know to contact the clinic, my physician, or go to the Emergency Department if any problems should occur. I do not have any questions at this time, but understand that I may call the clinic during office hours   should I have any questions or need assistance in obtaining follow up care.    __________________________________________  _____________  __________ Signature of Patient or Authorized Representative            Date                   Time    __________________________________________ Nurse's Signature    
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
## 2013-05-17 ENCOUNTER — Ambulatory Visit: Payer: Medicare Other

## 2013-05-17 ENCOUNTER — Telehealth: Payer: Self-pay | Admitting: *Deleted

## 2013-05-17 NOTE — Telephone Encounter (Signed)
NOTIFIED DR.MAGRINAT'S NURSE, VAL DODD,RN. INSTRUCTED PT. TO CALL VAL TOMORROW WITH AN UPDATE ON THE ROADS IN HER COMMUNITY SO A DECISION CAN BE MADE CONCERNING PT.'S INJECTION APPOINTMENT. PT. VOICES UNDERSTANDING.

## 2013-05-18 ENCOUNTER — Ambulatory Visit (HOSPITAL_BASED_OUTPATIENT_CLINIC_OR_DEPARTMENT_OTHER): Payer: Medicare Other

## 2013-05-18 VITALS — BP 139/68 | HR 66 | Temp 98.1°F

## 2013-05-18 DIAGNOSIS — C50411 Malignant neoplasm of upper-outer quadrant of right female breast: Secondary | ICD-10-CM

## 2013-05-18 DIAGNOSIS — Z5189 Encounter for other specified aftercare: Secondary | ICD-10-CM

## 2013-05-18 DIAGNOSIS — C50419 Malignant neoplasm of upper-outer quadrant of unspecified female breast: Secondary | ICD-10-CM | POA: Diagnosis not present

## 2013-05-18 MED ORDER — PEGFILGRASTIM INJECTION 6 MG/0.6ML
6.0000 mg | Freq: Once | SUBCUTANEOUS | Status: AC
  Administered 2013-05-18: 6 mg via SUBCUTANEOUS
  Filled 2013-05-18: qty 0.6

## 2013-05-18 NOTE — Telephone Encounter (Signed)
Patient calling back in to r/s injection from yesterday. Can be here today at 2pm. We will have scheduling move appt to injection nurse for 2pm.  Patient confirmed date and time.

## 2013-05-18 NOTE — Patient Instructions (Signed)

## 2013-05-24 ENCOUNTER — Telehealth: Payer: Self-pay | Admitting: Dietician

## 2013-05-24 ENCOUNTER — Ambulatory Visit: Payer: Medicare Other | Admitting: Oncology

## 2013-05-24 ENCOUNTER — Other Ambulatory Visit: Payer: Medicare Other

## 2013-05-24 ENCOUNTER — Telehealth: Payer: Self-pay

## 2013-05-24 ENCOUNTER — Telehealth: Payer: Self-pay | Admitting: *Deleted

## 2013-05-24 NOTE — Telephone Encounter (Signed)
Per pof.per orders AMY pt is aware of her appts d/t for 05/25/13...td

## 2013-05-24 NOTE — Telephone Encounter (Signed)
Patient unable to come today, [place] not get [place]. She insist she needs to see Dr Jana Hakim before her chemo, she is having neuropathy and wants to talk to him before starting her new treatment. Will look at schedule to have patient appt moved.

## 2013-05-24 NOTE — Telephone Encounter (Signed)
Brief Outpatient Oncology Nutrition Note  Patient has been identified to be at risk on malnutrition screen.  Wt Readings from Last 10 Encounters:  05/16/13 160 lb 1.6 oz (72.621 kg)  05/09/13 162 lb 4.8 oz (73.619 kg)  05/02/13 164 lb (74.39 kg)  04/25/13 164 lb 11.2 oz (74.707 kg)  04/18/13 167 lb 3.2 oz (75.841 kg)  04/12/13 165 lb 4.8 oz (74.98 kg)  04/04/13 170 lb 3.2 oz (77.202 kg)  03/25/13 170 lb 12.8 oz (77.474 kg)  03/17/13 171 lb 4.8 oz (77.701 kg)  03/15/13 170 lb (77.111 kg)   Patient with breast cancer undergoing chemo.    Called patient secondary to weight loss.  Patient reports taste alterations and has been rinsing her mouth with baking soda water.  Also complains of early satiety.  Trying to eat several small meals daily.    Discussed tips to increase calories and protein  Discussed options of adding supplements as needed if continue to be unable to maintain weight.  [place] RD contact information provided and encouraged patient to call if she has continued problem maintaining weight or eating.  Antonieta Iba, RD, LDN

## 2013-05-25 ENCOUNTER — Telehealth: Payer: Self-pay | Admitting: *Deleted

## 2013-05-25 ENCOUNTER — Ambulatory Visit (HOSPITAL_BASED_OUTPATIENT_CLINIC_OR_DEPARTMENT_OTHER): Payer: Medicare Other | Admitting: Oncology

## 2013-05-25 ENCOUNTER — Other Ambulatory Visit (HOSPITAL_BASED_OUTPATIENT_CLINIC_OR_DEPARTMENT_OTHER): Payer: Medicare Other

## 2013-05-25 VITALS — BP 116/63 | HR 89 | Temp 98.3°F | Resp 18 | Ht 64.0 in | Wt 157.7 lb

## 2013-05-25 DIAGNOSIS — C50419 Malignant neoplasm of upper-outer quadrant of unspecified female breast: Secondary | ICD-10-CM | POA: Diagnosis not present

## 2013-05-25 DIAGNOSIS — C50411 Malignant neoplasm of upper-outer quadrant of right female breast: Secondary | ICD-10-CM

## 2013-05-25 DIAGNOSIS — D702 Other drug-induced agranulocytosis: Secondary | ICD-10-CM

## 2013-05-25 DIAGNOSIS — D649 Anemia, unspecified: Secondary | ICD-10-CM

## 2013-05-25 DIAGNOSIS — Z171 Estrogen receptor negative status [ER-]: Secondary | ICD-10-CM | POA: Diagnosis not present

## 2013-05-25 DIAGNOSIS — E785 Hyperlipidemia, unspecified: Secondary | ICD-10-CM

## 2013-05-25 DIAGNOSIS — D0590 Unspecified type of carcinoma in situ of unspecified breast: Secondary | ICD-10-CM

## 2013-05-25 DIAGNOSIS — R9431 Abnormal electrocardiogram [ECG] [EKG]: Secondary | ICD-10-CM

## 2013-05-25 DIAGNOSIS — I10 Essential (primary) hypertension: Secondary | ICD-10-CM

## 2013-05-25 LAB — CBC WITH DIFFERENTIAL/PLATELET
BASO%: 0.4 % (ref 0.0–2.0)
BASOS ABS: 0 10*3/uL (ref 0.0–0.1)
EOS%: 0.2 % (ref 0.0–7.0)
Eosinophils Absolute: 0 10*3/uL (ref 0.0–0.5)
HEMATOCRIT: 31 % — AB (ref 34.8–46.6)
HEMOGLOBIN: 10.4 g/dL — AB (ref 11.6–15.9)
LYMPH#: 0.2 10*3/uL — AB (ref 0.9–3.3)
LYMPH%: 13.2 % — ABNORMAL LOW (ref 14.0–49.7)
MCH: 28.8 pg (ref 25.1–34.0)
MCHC: 33.5 g/dL (ref 31.5–36.0)
MCV: 85.9 fL (ref 79.5–101.0)
MONO#: 0.4 10*3/uL (ref 0.1–0.9)
MONO%: 21.2 % — ABNORMAL HIGH (ref 0.0–14.0)
NEUT#: 1.1 10*3/uL — ABNORMAL LOW (ref 1.5–6.5)
NEUT%: 65 % (ref 38.4–76.8)
Platelets: 113 10*3/uL — ABNORMAL LOW (ref 145–400)
RBC: 3.61 10*6/uL — ABNORMAL LOW (ref 3.70–5.45)
RDW: 15.5 % — ABNORMAL HIGH (ref 11.2–14.5)
WBC: 1.7 10*3/uL — AB (ref 3.9–10.3)

## 2013-05-25 NOTE — Telephone Encounter (Signed)
Per staff message and POF I have scheduled appts.  Shannon Grant  

## 2013-05-25 NOTE — Telephone Encounter (Signed)
Charting error.

## 2013-05-25 NOTE — Telephone Encounter (Signed)
appts made and printed. Pt is aware that tx will be added. i emailed MW to add the tx...td 
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
## 2013-05-27 NOTE — Progress Notes (Signed)
Addendum: if Mckenzi tolerates the first 2 cycles of paclitaxel well, we will discuss adding [place] weekly (she will see me 3/23 to discuss this)

## 2013-05-30 ENCOUNTER — Other Ambulatory Visit: Payer: Medicare Other

## 2013-05-30 ENCOUNTER — Ambulatory Visit: Payer: Medicare Other

## 2013-05-30 ENCOUNTER — Ambulatory Visit: Payer: Medicare Other | Admitting: Physician Assistant

## 2013-05-31 ENCOUNTER — Telehealth: Payer: Self-pay | Admitting: *Deleted

## 2013-05-31 NOTE — Telephone Encounter (Signed)
Per staff message I have adjusted appts  

## 2013-05-31 NOTE — Telephone Encounter (Signed)
sw pt informed her that her time for arrival has changed for 3/16 and 3/23,. gv appt time for 06/13/13 w/ labs@ 10am, ov@ 10:30am, and tx to follow. 06/20/13 w/ labs@ 9am, ov@ 9:30am, and tx to follow. i emailed MW to adjust tx times...td

## 2013-06-06 ENCOUNTER — Other Ambulatory Visit: Payer: Self-pay | Admitting: Oncology

## 2013-06-06 ENCOUNTER — Ambulatory Visit (HOSPITAL_BASED_OUTPATIENT_CLINIC_OR_DEPARTMENT_OTHER): Payer: Medicare Other | Admitting: Physician Assistant

## 2013-06-06 ENCOUNTER — Telehealth: Payer: Self-pay | Admitting: *Deleted

## 2013-06-06 ENCOUNTER — Other Ambulatory Visit (HOSPITAL_BASED_OUTPATIENT_CLINIC_OR_DEPARTMENT_OTHER): Payer: Medicare Other

## 2013-06-06 ENCOUNTER — Ambulatory Visit: Payer: Medicare Other

## 2013-06-06 ENCOUNTER — Telehealth: Payer: Self-pay | Admitting: Adult Health

## 2013-06-06 ENCOUNTER — Encounter: Payer: Self-pay | Admitting: Physician Assistant

## 2013-06-06 VITALS — BP 145/83 | HR 84 | Temp 98.9°F | Resp 20 | Ht 64.0 in | Wt 158.0 lb

## 2013-06-06 DIAGNOSIS — C50419 Malignant neoplasm of upper-outer quadrant of unspecified female breast: Secondary | ICD-10-CM

## 2013-06-06 DIAGNOSIS — C50411 Malignant neoplasm of upper-outer quadrant of right female breast: Secondary | ICD-10-CM

## 2013-06-06 DIAGNOSIS — G589 Mononeuropathy, unspecified: Secondary | ICD-10-CM

## 2013-06-06 DIAGNOSIS — G629 Polyneuropathy, unspecified: Secondary | ICD-10-CM

## 2013-06-06 DIAGNOSIS — D649 Anemia, unspecified: Secondary | ICD-10-CM

## 2013-06-06 DIAGNOSIS — D0590 Unspecified type of carcinoma in situ of unspecified breast: Secondary | ICD-10-CM

## 2013-06-06 DIAGNOSIS — R5383 Other fatigue: Secondary | ICD-10-CM

## 2013-06-06 DIAGNOSIS — R5381 Other malaise: Secondary | ICD-10-CM | POA: Diagnosis not present

## 2013-06-06 LAB — COMPREHENSIVE METABOLIC PANEL (CC13)
ALK PHOS: 62 U/L (ref 40–150)
ALT: 21 U/L (ref 0–55)
ANION GAP: 10 meq/L (ref 3–11)
AST: 23 U/L (ref 5–34)
Albumin: 3.5 g/dL (ref 3.5–5.0)
BILIRUBIN TOTAL: 0.42 mg/dL (ref 0.20–1.20)
BUN: 8.9 mg/dL (ref 7.0–26.0)
CO2: 26 mEq/L (ref 22–29)
Calcium: 10 mg/dL (ref 8.4–10.4)
Chloride: 108 mEq/L (ref 98–109)
Creatinine: 0.7 mg/dL (ref 0.6–1.1)
GLUCOSE: 101 mg/dL (ref 70–140)
Potassium: 3.8 mEq/L (ref 3.5–5.1)
Sodium: 143 mEq/L (ref 136–145)
Total Protein: 6.2 g/dL — ABNORMAL LOW (ref 6.4–8.3)

## 2013-06-06 LAB — CBC WITH DIFFERENTIAL/PLATELET
BASO%: 1.2 % (ref 0.0–2.0)
Basophils Absolute: 0.1 10*3/uL (ref 0.0–0.1)
EOS%: 0.2 % (ref 0.0–7.0)
Eosinophils Absolute: 0 10*3/uL (ref 0.0–0.5)
HEMATOCRIT: 30.3 % — AB (ref 34.8–46.6)
HGB: 10.2 g/dL — ABNORMAL LOW (ref 11.6–15.9)
LYMPH%: 5.6 % — AB (ref 14.0–49.7)
MCH: 29.7 pg (ref 25.1–34.0)
MCHC: 33.7 g/dL (ref 31.5–36.0)
MCV: 88 fL (ref 79.5–101.0)
MONO#: 1.1 10*3/uL — ABNORMAL HIGH (ref 0.1–0.9)
MONO%: 13.8 % (ref 0.0–14.0)
NEUT#: 6.3 10*3/uL (ref 1.5–6.5)
NEUT%: 79.2 % — AB (ref 38.4–76.8)
PLATELETS: 424 10*3/uL — AB (ref 145–400)
RBC: 3.44 10*6/uL — ABNORMAL LOW (ref 3.70–5.45)
RDW: 18.7 % — ABNORMAL HIGH (ref 11.2–14.5)
WBC: 7.9 10*3/uL (ref 3.9–10.3)
lymph#: 0.4 10*3/uL — ABNORMAL LOW (ref 0.9–3.3)

## 2013-06-06 LAB — LIPASE: Lipase: 29 U/L (ref 0–75)

## 2013-06-06 LAB — AMYLASE: Amylase: 27 U/L (ref 0–105)

## 2013-06-06 NOTE — Progress Notes (Addendum)
ID: Shannon Grant OB: 1945/12/25  MR#: [id number]  RKY#:[id number]  PCP: Shannon Bellows, MD GYN:  Shannon Grant SU: Star Age OTHER MD: Shannon Grant, Shannon Grant  CHIEF COMPLAINT:  Right Breast Cancer/Adjuvant Chemo    HISTORY OF PRESENT ILLNESS: Shannon Grant had a routine screening mammogram at [facility] July 2013 which suggested a possible abnormality in the right breast. She was brought back for additional mammographic views and right breast ultrasonography 10/16/2011. There was a hyperdense 7 mm oval well-defined mass in the upper outer quadrant of the right breast which, by ultrasonography, proved to be a 6 mm simple cyst.  On 01/20/2013 she underwent bilateral diagnostic mammography which found a 1.5 cm high-density mass at the 11:00 position of the right breast. Ultrasound of the right breast showed this to be a 1.3 cm hypoechoic mass, which was biopsied 01/24/2013. The pathology ([id number]) showed an invasive ductal carcinoma, with a dense lymphocytic infiltrate, triple negative, with an MIB-1 of 88%.  On 01/31/2013 the patient underwent bilateral breast MRI. This found in the upper outer quadrant of the right breast an oval enhancing mass measuring 1.4 cm. There were no abnormal lymph nodes or other masses of concern in either breast. There were however 3 circumscribed lesions in the liver, suggestive of benign cysts or hemangiomas.  The patient's subsequent history is as detailed below.  INTERVAL HISTORY: Shannon Grant returns today accompanied by her daughter Shannon Grant. She has completed 4 cycles of dose dense doxorubicin/cyclophosphamide given in the adjuvant setting, her final dose given on 05/16/2013. She was also receiving Neulasta on day 2 for granulocyte support. The plan is to initiate weekly paclitaxel, and this has been delayed an extra week to give Shannon Grant time to recover from her previous regimen.    However is still extremely weak and tired today, even 3 weeks out from her final  dose of doxorubicin/cyclophosphamide. Her appetite is still decreased, but she is trying to keep herself well hydrated. Fortunately, she has had no nausea.  Of biggest concern today is the fact that she tells me she has not missed in her fingertips and in her feet. She is having difficulty with some fine motor skills, and in fact is finding it difficult to button and unbutton her blouse. The numbness in her lower extremities has not been particularly problematic thus far, and fortunately she denies any problems with walking and has had no falls. She's had no significant skin changes in either the hands or feet.     REVIEW OF SYSTEMS: [name] denies any recent fevers or chills. She's had no significant skin changes and denies any abnormal bruising or bleeding. She's had no change in bowel or bladder habits, although she does tend to be slightly constipated. She denies any cough, increased shortness of breath, chest pain, or palpitations. She's had no abnormal headaches or dizziness. Her vision has been a little blurry. She currently denies any unusual myalgias, arthralgias, or bony pain.  A detailed review of systems is otherwise stable and noncontributory.    PAST MEDICAL HISTORY: Past Medical History  Diagnosis Date  . Hypertension   . Thyroid disease     hypothyroid  . High cholesterol   . Fibroid   . Breast cancer   . Arthritis     PAST SURGICAL HISTORY: Past Surgical History  Procedure Laterality Date  . Tonsillectomy and adenoidectomy    . Left knee surgery      meniscus  . Facial fracture surgery  [facility] at [facility]  . Cesarean section    . Thyroidectomy, partial    . Total hip arthroplasty    . Vein surgery left leg    . Dilation and curettage of uterus    . Hysteroscopy    . Breast lumpectomy with needle localization and axillary sentinel lymph node bx Right 03/03/2013    Procedure: BREAST LUMPECTOMY WITH NEEDLE LOCALIZATION AND AXILLARY SENTINEL LYMPH NODE BX;   Surgeon: Merrie Roof, MD;  Location: [place];  Service: General;  Laterality: Right;  . Portacath placement Left 03/03/2013    Procedure: INSERTION PORT-A-CATH;  Surgeon: Merrie Roof, MD;  Location: [place];  Service: General;  Laterality: Left;  . Breast surgery      FAMILY HISTORY Family History  Problem Relation Age of Onset  . Suicidality Mother   . Heart attack Father   . Congestive Heart Failure Father   . Heart disease Father   . Cancer Maternal Uncle     unknown  . Kidney disease Maternal Grandfather   . Heart attack Maternal Uncle     MI age 78-50  . Breast cancer Paternal Aunt 61   the patient's father died at the age of 67, she thinks from complications of alcohol abuse. The patient's mother committed suicide at the age of 16. The patient is an only child. The patient's father's only sister was diagnosed with breast cancer in her 34s. There is no other history of breast or ovarian cancer in the family  GYNECOLOGIC HISTORY:  Menarche age 57, first live birth age 89, she is [place] P3. She had menopause in her 59s. She did not have hormone replacement. She did use birth control pills for approximately 4 years remotely, with no complications.  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 She lives alone with her pound rescue Shannon Grant. The patient's daughter Shannon Grant is an Sales promotion account executive in [place]. The patient's daughter Shannon Grant lives in [place]. The patient has 3 grandchildren. She is not a church attender    ADVANCED DIRECTIVES: Not in place   HEALTH MAINTENANCE:  (Updated 04/18/2013) History  Substance Use Topics  . Smoking status: Former Smoker    Quit date: 03/25/1985  . Smokeless tobacco: Never Used  . Alcohol Use: Yes     Comment: Occas     Colonoscopy: Not on file/Mid 1990s?  PAP: December 2014, Dr. Phineas Real  Bone density: Not on file/Mid 1990s? ("It was  normal")  Lipid panel:  Not on file  Allergies  Allergen Reactions  . Codeine Nausea And Vomiting  . Statins Other (See Comments)    (Including Red Yeast Rice) Causes muscle cramps  . Ciprofloxacin Rash    Current Outpatient Prescriptions  Medication Sig Dispense Refill  . Cholecalciferol (VITAMIN D) 2000 UNITS tablet Take 2,000 Units by mouth daily.      . Coenzyme Q10 300 MG CAPS Take 300 mg by mouth daily.       Marland Kitchen dexamethasone (DECADRON) 4 MG tablet       . Krill Oil (MAXIMUM RED KRILL PO) Take 500 mg by mouth daily.      Marland Kitchen levothyroxine (SYNTHROID, LEVOTHROID) 100 MCG tablet Take 100 mcg by mouth daily.      Marland Kitchen lidocaine-prilocaine (EMLA) cream Apply topically 1.5 hours before use as directed.  Cover cream with plastic.  30 g  0  .  Magnesium 100 MG TABS Take 200 mg by mouth daily.       . traMADol (ULTRAM) 50 MG tablet Take 1-2 tablets (50-100 mg total) by mouth every 6 (six) hours as needed.  50 tablet  0  . hydrochlorothiazide (HYDRODIURIL) 25 MG tablet Take 12.5 mg by mouth daily.       Marland Kitchen LORazepam (ATIVAN) 0.5 MG tablet       . omeprazole (PRILOSEC) 20 MG capsule Take 1 capsule (20 mg total) by mouth daily.  30 capsule  2  . prochlorperazine (COMPAZINE) 10 MG tablet       . vitamin C (ASCORBIC ACID) 500 MG tablet Take 1,000 mg by mouth daily.        No current facility-administered medications for this visit.    OBJECTIVE: Middle-aged white woman who appears weak and tired but is in no acute distress Filed Vitals:   06/06/13 1128  BP: 145/83  Pulse: 84  Temp: 98.9 F (37.2 C)  Resp: 20     Body mass index is 27.11 kg/(m^2).    ECOG FS: 1 Filed Weights   06/06/13 1128  Weight: 158 lb (71.668 kg)   Physical Exam: HEENT:  Sclerae anicteric.  Oropharynx clear and moist. No ulcerations, and no evidence of oropharyngeal candidiasis. Neck supple, trachea midline.  NODES:  No cervical or supraclavicular lymphadenopathy palpated.  BREAST EXAM:  Deferred. Axillae are  benign bilaterally, no palpable lymphadenopathy. LUNGS:  Clear to auscultation bilaterally.  No wheezes or rhonchi HEART:  Regular rate and rhythm. No murmur  ABDOMEN:  Soft, nontender.  Positive bowel sounds.  MSK:  No focal spinal tenderness to palpation. Good range of motion bilaterally in the upper extremities. EXTREMITIES:  No peripheral edema.  No lymphedema in the upper extremities. SKIN:  Benign no visible rashes or skin lesions. No excessive ecchymoses. No petechiae. No pallor. NEURO:  Nonfocal. Well oriented.  Fatigued affect.    LAB RESULTS:   Lab Results  Component Value Date   WBC 7.9 06/06/2013   NEUTROABS 6.3 06/06/2013   HGB 10.2* 06/06/2013   HCT 30.3* 06/06/2013   MCV 88.0 06/06/2013   PLT 424* 06/06/2013      Chemistry      Component Value Date/Time   NA 143 06/06/2013 1042   NA 140 02/22/2013 0958   K 3.8 06/06/2013 1042   K 3.7 02/22/2013 0958   CL 101 02/22/2013 0958   CO2 26 06/06/2013 1042   CO2 28 02/22/2013 0958   BUN 8.9 06/06/2013 1042   BUN 9 02/22/2013 0958   CREATININE 0.7 06/06/2013 1042   CREATININE 0.67 02/22/2013 0958      Component Value Date/Time   CALCIUM 10.0 06/06/2013 1042   CALCIUM 10.2 02/22/2013 0958   ALKPHOS 62 06/06/2013 1042   ALKPHOS 66 03/25/2012 1350   AST 23 06/06/2013 1042   AST 22 03/25/2012 1350   ALT 21 06/06/2013 1042   ALT 18 03/25/2012 1350   BILITOT 0.42 06/06/2013 1042   BILITOT 0.4 03/25/2012 1350      STUDIES:  No results found.    ASSESSMENT: 68 y.o. Man woman status post right breast biopsy 01/24/2013 for a clinical T1c. N0, stage IA invasive ductal carcinoma, grade 3, triple negative, with an MIB-1 of 88%.  (1) status post right lumpectomy and sentinel lymph node sampling 03/03/2013 showing the right breast mass in question to have been an intramammary lymph node replaced by tumor. The sentinel lymph node in the armpit was  benign. Final stage is TX N1, stage II  (2) adjuvant chemotherapy with dose dense  doxorubicin and cyclophosphamide x4, with Neulasta support, started 04/04/2012, completed 02/16/20115.  The plan is to follow the first 4 cycles with 12 weekly doses of paclitaxel, possibly eventually adding weekly carboplatin as well.  (3) adjuvant radiation to follow chemotherapy  PLAN: Kimanh still feels extremely weak, and per Dr. Jana Hakim review, we will hold her chemotherapy for another week. We are both very concerned about the apparent development of peripheral neuropathy and we'll follow this closely.  Mirajane is scheduled to return next week on March 16. We will change our original treatment regimen from paclitaxel/carboplatin to carboplatin/gemcitabine. The plan will be to treat with both agents on day one, gemcitabine alone on day 8, and possibly proceed with Neulasta on day 9. I will assess Tamy again when I see her next week, and if in fact we proceed with chemotherapy at that time, I will review any changes necessary in her antinausea regimen. (Of note, she tells me she has taken "no nausea medication at all" during her [facility].)  Of course, once she completes her adjuvant chemotherapy she will be ready to start her radiation treatments.  All of the above was reviewed with Pamala Hurry and her daughter today, both of whom voice understanding and agreement with this plan. They'll call with any changes or problems prior to her next appointment here on March 16.   Sakeena Teall, PA-C    06/06/2013 9:08 PM   ADDENDUM: I think we're going to have to be very careful in Sallee's case. Note only is she week but it is difficult to ascertain just how much of her symptoms is due to neuropathy and how much to weakness and malaise. I think the better part of valor is to switch from Taxol to gemcitabine. We do not have grade 1 data for this agent in the adjuvant setting, but it certainly is effective with metastatic disease and it is my the fall treatment when patients cannot tolerate Taxol.  Schae has a  good understanding of this plan. She agrees with that. She will call with any problems that may develop before her next visit here.   Marland Kitchen   Chauncey Cruel, MD

## 2013-06-06 NOTE — Telephone Encounter (Signed)
, °

## 2013-06-06 NOTE — Telephone Encounter (Signed)
Per POF I have changed appts

## 2013-06-07 ENCOUNTER — Other Ambulatory Visit: Payer: Self-pay | Admitting: Physician Assistant

## 2013-06-08 ENCOUNTER — Other Ambulatory Visit: Payer: Self-pay | Admitting: Physician Assistant

## 2013-06-09 ENCOUNTER — Other Ambulatory Visit: Payer: Self-pay | Admitting: Physician Assistant

## 2013-06-09 ENCOUNTER — Other Ambulatory Visit: Payer: Self-pay | Admitting: Oncology

## 2013-06-13 ENCOUNTER — Other Ambulatory Visit (HOSPITAL_BASED_OUTPATIENT_CLINIC_OR_DEPARTMENT_OTHER): Payer: Medicare Other

## 2013-06-13 ENCOUNTER — Encounter (INDEPENDENT_AMBULATORY_CARE_PROVIDER_SITE_OTHER): Payer: Self-pay | Admitting: General Surgery

## 2013-06-13 ENCOUNTER — Ambulatory Visit: Payer: Medicare Other

## 2013-06-13 ENCOUNTER — Ambulatory Visit (HOSPITAL_BASED_OUTPATIENT_CLINIC_OR_DEPARTMENT_OTHER): Payer: Medicare Other

## 2013-06-13 ENCOUNTER — Ambulatory Visit (HOSPITAL_BASED_OUTPATIENT_CLINIC_OR_DEPARTMENT_OTHER): Payer: Medicare Other | Admitting: Physician Assistant

## 2013-06-13 ENCOUNTER — Other Ambulatory Visit: Payer: Medicare Other

## 2013-06-13 ENCOUNTER — Telehealth: Payer: Self-pay | Admitting: *Deleted

## 2013-06-13 ENCOUNTER — Encounter: Payer: Self-pay | Admitting: Physician Assistant

## 2013-06-13 ENCOUNTER — Ambulatory Visit (INDEPENDENT_AMBULATORY_CARE_PROVIDER_SITE_OTHER): Payer: Medicare Other | Admitting: General Surgery

## 2013-06-13 VITALS — BP 142/82 | HR 75 | Temp 98.5°F | Resp 14 | Ht 64.0 in | Wt 157.0 lb

## 2013-06-13 VITALS — BP 141/85 | HR 79 | Temp 98.9°F | Resp 18 | Ht 64.0 in | Wt 154.7 lb

## 2013-06-13 DIAGNOSIS — R5383 Other fatigue: Secondary | ICD-10-CM | POA: Diagnosis not present

## 2013-06-13 DIAGNOSIS — C50419 Malignant neoplasm of upper-outer quadrant of unspecified female breast: Secondary | ICD-10-CM

## 2013-06-13 DIAGNOSIS — Z171 Estrogen receptor negative status [ER-]: Secondary | ICD-10-CM

## 2013-06-13 DIAGNOSIS — G609 Hereditary and idiopathic neuropathy, unspecified: Secondary | ICD-10-CM

## 2013-06-13 DIAGNOSIS — C50411 Malignant neoplasm of upper-outer quadrant of right female breast: Secondary | ICD-10-CM

## 2013-06-13 DIAGNOSIS — Z5111 Encounter for antineoplastic chemotherapy: Secondary | ICD-10-CM

## 2013-06-13 DIAGNOSIS — R5381 Other malaise: Secondary | ICD-10-CM | POA: Diagnosis not present

## 2013-06-13 DIAGNOSIS — N63 Unspecified lump in unspecified breast: Secondary | ICD-10-CM

## 2013-06-13 DIAGNOSIS — D0590 Unspecified type of carcinoma in situ of unspecified breast: Secondary | ICD-10-CM

## 2013-06-13 DIAGNOSIS — IMO0002 Reserved for concepts with insufficient information to code with codable children: Secondary | ICD-10-CM

## 2013-06-13 DIAGNOSIS — D649 Anemia, unspecified: Secondary | ICD-10-CM

## 2013-06-13 DIAGNOSIS — G629 Polyneuropathy, unspecified: Secondary | ICD-10-CM

## 2013-06-13 LAB — CBC WITH DIFFERENTIAL/PLATELET
BASO%: 1.2 % (ref 0.0–2.0)
Basophils Absolute: 0.1 10*3/uL (ref 0.0–0.1)
EOS ABS: 0.1 10*3/uL (ref 0.0–0.5)
EOS%: 1.2 % (ref 0.0–7.0)
HEMATOCRIT: 31.7 % — AB (ref 34.8–46.6)
HGB: 10.3 g/dL — ABNORMAL LOW (ref 11.6–15.9)
LYMPH#: 0.7 10*3/uL — AB (ref 0.9–3.3)
LYMPH%: 9 % — AB (ref 14.0–49.7)
MCH: 28.8 pg (ref 25.1–34.0)
MCHC: 32.5 g/dL (ref 31.5–36.0)
MCV: 88.5 fL (ref 79.5–101.0)
MONO#: 1.2 10*3/uL — AB (ref 0.1–0.9)
MONO%: 16.1 % — ABNORMAL HIGH (ref 0.0–14.0)
NEUT%: 72.5 % (ref 38.4–76.8)
NEUTROS ABS: 5.3 10*3/uL (ref 1.5–6.5)
PLATELETS: 259 10*3/uL (ref 145–400)
RBC: 3.58 10*6/uL — ABNORMAL LOW (ref 3.70–5.45)
RDW: 18.1 % — ABNORMAL HIGH (ref 11.2–14.5)
WBC: 7.3 10*3/uL (ref 3.9–10.3)
nRBC: 0 % (ref 0–0)

## 2013-06-13 MED ORDER — SODIUM CHLORIDE 0.9 % IV SOLN
800.0000 mg/m2 | Freq: Once | INTRAVENOUS | Status: AC
  Administered 2013-06-13: 1444 mg via INTRAVENOUS
  Filled 2013-06-13: qty 37.98

## 2013-06-13 MED ORDER — SODIUM CHLORIDE 0.9 % IJ SOLN
10.0000 mL | INTRAMUSCULAR | Status: DC | PRN
  Administered 2013-06-13: 10 mL
  Filled 2013-06-13: qty 10

## 2013-06-13 MED ORDER — ONDANSETRON 16 MG/50ML IVPB (CHCC)
INTRAVENOUS | Status: AC
  Filled 2013-06-13: qty 16

## 2013-06-13 MED ORDER — HEPARIN SOD (PORK) LOCK FLUSH 100 UNIT/ML IV SOLN
500.0000 [IU] | Freq: Once | INTRAVENOUS | Status: AC | PRN
  Administered 2013-06-13: 500 [IU]
  Filled 2013-06-13: qty 5

## 2013-06-13 MED ORDER — DOXYCYCLINE HYCLATE 100 MG PO TABS: 100.0000 mg | ORAL_TABLET | Freq: Every day | ORAL | Status: DC

## 2013-06-13 MED ORDER — DEXAMETHASONE SODIUM PHOSPHATE 20 MG/5ML IJ SOLN
INTRAMUSCULAR | Status: AC
  Filled 2013-06-13: qty 5

## 2013-06-13 MED ORDER — DEXAMETHASONE SODIUM PHOSPHATE 20 MG/5ML IJ SOLN
20.0000 mg | Freq: Once | INTRAMUSCULAR | Status: AC
  Administered 2013-06-13: 20 mg via INTRAVENOUS

## 2013-06-13 MED ORDER — ONDANSETRON 16 MG/50ML IVPB (CHCC)
16.0000 mg | Freq: Once | INTRAVENOUS | Status: AC
  Administered 2013-06-13: 16 mg via INTRAVENOUS

## 2013-06-13 MED ORDER — CARBOPLATIN CHEMO INJECTION 600 MG/60ML
429.5000 mg | Freq: Once | INTRAVENOUS | Status: AC
  Administered 2013-06-13: 430 mg via INTRAVENOUS
  Filled 2013-06-13: qty 43

## 2013-06-13 MED ORDER — SODIUM CHLORIDE 0.9 % IV SOLN
Freq: Once | INTRAVENOUS | Status: AC
  Administered 2013-06-13: 12:00:00 via INTRAVENOUS

## 2013-06-13 NOTE — Patient Instructions (Signed)
[facility] Discharge Instructions for Patients Receiving Chemotherapy  Today you received the following chemotherapy agents Gemzar and Carboplatin.  To help prevent nausea and vomiting after your treatment, we encourage you to take your nausea medication.   If you develop nausea and vomiting that is not controlled by your nausea medication, call the clinic.   BELOW ARE SYMPTOMS THAT SHOULD BE REPORTED IMMEDIATELY:  *FEVER GREATER THAN 100.5 F  *CHILLS WITH OR WITHOUT FEVER  NAUSEA AND VOMITING THAT IS NOT CONTROLLED WITH YOUR NAUSEA MEDICATION  *UNUSUAL SHORTNESS OF BREATH  *UNUSUAL BRUISING OR BLEEDING  TENDERNESS IN MOUTH AND THROAT WITH OR WITHOUT PRESENCE OF ULCERS  *URINARY PROBLEMS  *BOWEL PROBLEMS  UNUSUAL RASH Items with * indicate a potential emergency and should be followed up as soon as possible.  Feel free to call the clinic you have any questions or concerns. The clinic phone number is [phone].    

## 2013-06-13 NOTE — Patient Instructions (Signed)
Continue with chemotherapy

## 2013-06-13 NOTE — Telephone Encounter (Signed)
appts made and printed. Pt is aware that her tx will be adjust for 07/04/13. i emailed MW...td
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
## 2013-06-14 ENCOUNTER — Telehealth: Payer: Self-pay | Admitting: *Deleted

## 2013-06-14 NOTE — Telephone Encounter (Signed)
Spoke with pt for post chemo follow up call.  Pt stated she was doing ok.  Denied nausea/vomiting, appetite ok, and drinking lots of fluids as tolerated.  Has constipation problem.  Gave pt info. On dietary fibers - prunes, drink prune juice, applesauce, eat Activia yogurt.   Instructed pt to also take Magnesium Citrate and/or Colace to help with her bowels.  Pt stated bladder functions fine.  Denied pain.  Pt also aware of next appt on 06/20/12. Pt stated she had good experience with her first changed chemo drugs yesterday.  Staff were courteous, nice, and pt was provided with explanations throughout her treatment.  No other concerns at this time.

## 2013-06-15 ENCOUNTER — Ambulatory Visit: Payer: Medicare Other | Admitting: Physician Assistant

## 2013-06-15 ENCOUNTER — Other Ambulatory Visit: Payer: Medicare Other

## 2013-06-20 ENCOUNTER — Ambulatory Visit (HOSPITAL_BASED_OUTPATIENT_CLINIC_OR_DEPARTMENT_OTHER): Payer: Medicare Other | Admitting: Oncology

## 2013-06-20 ENCOUNTER — Ambulatory Visit (HOSPITAL_BASED_OUTPATIENT_CLINIC_OR_DEPARTMENT_OTHER): Payer: Medicare Other

## 2013-06-20 ENCOUNTER — Telehealth: Payer: Self-pay | Admitting: *Deleted

## 2013-06-20 ENCOUNTER — Telehealth: Payer: Self-pay | Admitting: Physician Assistant

## 2013-06-20 ENCOUNTER — Other Ambulatory Visit: Payer: Medicare Other

## 2013-06-20 ENCOUNTER — Other Ambulatory Visit (HOSPITAL_BASED_OUTPATIENT_CLINIC_OR_DEPARTMENT_OTHER): Payer: Medicare Other

## 2013-06-20 VITALS — BP 147/85 | HR 73 | Temp 98.4°F | Resp 98 | Ht 64.0 in | Wt 153.2 lb

## 2013-06-20 DIAGNOSIS — C50419 Malignant neoplasm of upper-outer quadrant of unspecified female breast: Secondary | ICD-10-CM

## 2013-06-20 DIAGNOSIS — D6481 Anemia due to antineoplastic chemotherapy: Secondary | ICD-10-CM | POA: Insufficient documentation

## 2013-06-20 DIAGNOSIS — R5383 Other fatigue: Secondary | ICD-10-CM | POA: Diagnosis not present

## 2013-06-20 DIAGNOSIS — E785 Hyperlipidemia, unspecified: Secondary | ICD-10-CM

## 2013-06-20 DIAGNOSIS — T451X5A Adverse effect of antineoplastic and immunosuppressive drugs, initial encounter: Secondary | ICD-10-CM

## 2013-06-20 DIAGNOSIS — Z5111 Encounter for antineoplastic chemotherapy: Secondary | ICD-10-CM | POA: Diagnosis not present

## 2013-06-20 DIAGNOSIS — R5381 Other malaise: Secondary | ICD-10-CM | POA: Diagnosis not present

## 2013-06-20 DIAGNOSIS — C50411 Malignant neoplasm of upper-outer quadrant of right female breast: Secondary | ICD-10-CM

## 2013-06-20 DIAGNOSIS — I10 Essential (primary) hypertension: Secondary | ICD-10-CM

## 2013-06-20 DIAGNOSIS — G629 Polyneuropathy, unspecified: Secondary | ICD-10-CM

## 2013-06-20 DIAGNOSIS — D0590 Unspecified type of carcinoma in situ of unspecified breast: Secondary | ICD-10-CM

## 2013-06-20 LAB — CBC WITH DIFFERENTIAL/PLATELET
BASO%: 1.7 % (ref 0.0–2.0)
Basophils Absolute: 0.1 10*3/uL (ref 0.0–0.1)
EOS%: 8.6 % — ABNORMAL HIGH (ref 0.0–7.0)
Eosinophils Absolute: 0.3 10*3/uL (ref 0.0–0.5)
HCT: 29.4 % — ABNORMAL LOW (ref 34.8–46.6)
HGB: 9.5 g/dL — ABNORMAL LOW (ref 11.6–15.9)
LYMPH%: 10.9 % — AB (ref 14.0–49.7)
MCH: 28.6 pg (ref 25.1–34.0)
MCHC: 32.3 g/dL (ref 31.5–36.0)
MCV: 88.6 fL (ref 79.5–101.0)
MONO#: 0.3 10*3/uL (ref 0.1–0.9)
MONO%: 10.2 % (ref 0.0–14.0)
NEUT#: 2.1 10*3/uL (ref 1.5–6.5)
NEUT%: 68.6 % (ref 38.4–76.8)
PLATELETS: 160 10*3/uL (ref 145–400)
RBC: 3.32 10*6/uL — AB (ref 3.70–5.45)
RDW: 17 % — ABNORMAL HIGH (ref 11.2–14.5)
WBC: 3 10*3/uL — AB (ref 3.9–10.3)
lymph#: 0.3 10*3/uL — ABNORMAL LOW (ref 0.9–3.3)

## 2013-06-20 MED ORDER — PROCHLORPERAZINE MALEATE 10 MG PO TABS
ORAL_TABLET | ORAL | Status: AC
  Filled 2013-06-20: qty 1

## 2013-06-20 MED ORDER — PROCHLORPERAZINE MALEATE 10 MG PO TABS
10.0000 mg | ORAL_TABLET | Freq: Once | ORAL | Status: AC
  Administered 2013-06-20: 10 mg via ORAL

## 2013-06-20 MED ORDER — SODIUM CHLORIDE 0.9 % IV SOLN
800.0000 mg/m2 | Freq: Once | INTRAVENOUS | Status: AC
  Administered 2013-06-20: 1444 mg via INTRAVENOUS
  Filled 2013-06-20: qty 37.98

## 2013-06-20 MED ORDER — HEPARIN SOD (PORK) LOCK FLUSH 100 UNIT/ML IV SOLN
500.0000 [IU] | Freq: Once | INTRAVENOUS | Status: AC | PRN
  Administered 2013-06-20: 500 [IU]
  Filled 2013-06-20: qty 5

## 2013-06-20 MED ORDER — SODIUM CHLORIDE 0.9 % IJ SOLN
10.0000 mL | INTRAMUSCULAR | Status: DC | PRN
  Administered 2013-06-20: 10 mL
  Filled 2013-06-20: qty 10

## 2013-06-20 MED ORDER — SODIUM CHLORIDE 0.9 % IV SOLN
Freq: Once | INTRAVENOUS | Status: AC
  Administered 2013-06-20: 11:00:00 via INTRAVENOUS
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
## 2013-06-20 NOTE — Telephone Encounter (Signed)
I have adjusted appts per staff phone call

## 2013-06-20 NOTE — Telephone Encounter (Signed)
, °

## 2013-06-21 ENCOUNTER — Telehealth: Payer: Self-pay | Admitting: *Deleted

## 2013-06-21 ENCOUNTER — Ambulatory Visit (HOSPITAL_BASED_OUTPATIENT_CLINIC_OR_DEPARTMENT_OTHER): Payer: Medicare Other

## 2013-06-21 VITALS — BP 142/73 | HR 83 | Temp 98.6°F

## 2013-06-21 DIAGNOSIS — C50419 Malignant neoplasm of upper-outer quadrant of unspecified female breast: Secondary | ICD-10-CM

## 2013-06-21 DIAGNOSIS — Z5189 Encounter for other specified aftercare: Secondary | ICD-10-CM | POA: Diagnosis not present

## 2013-06-21 DIAGNOSIS — C50411 Malignant neoplasm of upper-outer quadrant of right female breast: Secondary | ICD-10-CM

## 2013-06-21 MED ORDER — PEGFILGRASTIM INJECTION 6 MG/0.6ML
6.0000 mg | Freq: Once | SUBCUTANEOUS | Status: AC
  Administered 2013-06-21: 6 mg via SUBCUTANEOUS
  Filled 2013-06-21: qty 0.6

## 2013-06-21 NOTE — Telephone Encounter (Signed)
Shannon Grant here for Neulasta injection following 1st gemzar chemotherapy.  States that she is doing well.  No nausea, vomiting or diarrhea.  Is drinking plenty of fluids and eating as tolerated.  All question answered.  Knows to call if she has any problems.

## 2013-06-27 ENCOUNTER — Other Ambulatory Visit: Payer: Self-pay | Admitting: *Deleted

## 2013-06-27 ENCOUNTER — Ambulatory Visit (HOSPITAL_BASED_OUTPATIENT_CLINIC_OR_DEPARTMENT_OTHER): Payer: Medicare Other | Admitting: Oncology

## 2013-06-27 ENCOUNTER — Ambulatory Visit (HOSPITAL_BASED_OUTPATIENT_CLINIC_OR_DEPARTMENT_OTHER): Payer: Medicare Other

## 2013-06-27 ENCOUNTER — Telehealth: Payer: Self-pay | Admitting: Oncology

## 2013-06-27 ENCOUNTER — Telehealth: Payer: Self-pay | Admitting: *Deleted

## 2013-06-27 ENCOUNTER — Ambulatory Visit: Payer: Medicare Other

## 2013-06-27 ENCOUNTER — Other Ambulatory Visit (HOSPITAL_BASED_OUTPATIENT_CLINIC_OR_DEPARTMENT_OTHER): Payer: Medicare Other

## 2013-06-27 VITALS — BP 162/84 | HR 77 | Temp 98.3°F | Resp 18 | Ht 64.0 in | Wt 151.0 lb

## 2013-06-27 DIAGNOSIS — I10 Essential (primary) hypertension: Secondary | ICD-10-CM

## 2013-06-27 DIAGNOSIS — D0590 Unspecified type of carcinoma in situ of unspecified breast: Secondary | ICD-10-CM

## 2013-06-27 DIAGNOSIS — C50419 Malignant neoplasm of upper-outer quadrant of unspecified female breast: Secondary | ICD-10-CM

## 2013-06-27 DIAGNOSIS — T451X5A Adverse effect of antineoplastic and immunosuppressive drugs, initial encounter: Secondary | ICD-10-CM

## 2013-06-27 DIAGNOSIS — C50411 Malignant neoplasm of upper-outer quadrant of right female breast: Secondary | ICD-10-CM

## 2013-06-27 DIAGNOSIS — Z171 Estrogen receptor negative status [ER-]: Secondary | ICD-10-CM

## 2013-06-27 DIAGNOSIS — D6959 Other secondary thrombocytopenia: Secondary | ICD-10-CM

## 2013-06-27 DIAGNOSIS — R5383 Other fatigue: Secondary | ICD-10-CM

## 2013-06-27 DIAGNOSIS — D6481 Anemia due to antineoplastic chemotherapy: Secondary | ICD-10-CM

## 2013-06-27 DIAGNOSIS — E785 Hyperlipidemia, unspecified: Secondary | ICD-10-CM

## 2013-06-27 LAB — CBC WITH DIFFERENTIAL/PLATELET
BASO%: 0.1 % (ref 0.0–2.0)
BASOS ABS: 0 10*3/uL (ref 0.0–0.1)
EOS%: 1 % (ref 0.0–7.0)
Eosinophils Absolute: 0.1 10*3/uL (ref 0.0–0.5)
HCT: 26.8 % — ABNORMAL LOW (ref 34.8–46.6)
HEMOGLOBIN: 8.8 g/dL — AB (ref 11.6–15.9)
LYMPH#: 0.4 10*3/uL — AB (ref 0.9–3.3)
LYMPH%: 5.2 % — ABNORMAL LOW (ref 14.0–49.7)
MCH: 29.2 pg (ref 25.1–34.0)
MCHC: 32.8 g/dL (ref 31.5–36.0)
MCV: 89 fL (ref 79.5–101.0)
MONO#: 0.6 10*3/uL (ref 0.1–0.9)
MONO%: 8 % (ref 0.0–14.0)
NEUT%: 85.7 % — ABNORMAL HIGH (ref 38.4–76.8)
NEUTROS ABS: 6 10*3/uL (ref 1.5–6.5)
Platelets: 22 10*3/uL — ABNORMAL LOW (ref 145–400)
RBC: 3.01 10*6/uL — ABNORMAL LOW (ref 3.70–5.45)
RDW: 17.2 % — AB (ref 11.2–14.5)
WBC: 7 10*3/uL (ref 3.9–10.3)
nRBC: 0 % (ref 0–0)

## 2013-06-27 LAB — COMPREHENSIVE METABOLIC PANEL (CC13)
ALK PHOS: 97 U/L (ref 40–150)
ALT: 68 U/L — ABNORMAL HIGH (ref 0–55)
AST: 45 U/L — ABNORMAL HIGH (ref 5–34)
Albumin: 4 g/dL (ref 3.5–5.0)
Anion Gap: 14 mEq/L — ABNORMAL HIGH (ref 3–11)
BUN: 7.3 mg/dL (ref 7.0–26.0)
CO2: 25 mEq/L (ref 22–29)
Calcium: 10.3 mg/dL (ref 8.4–10.4)
Chloride: 104 mEq/L (ref 98–109)
Creatinine: 0.7 mg/dL (ref 0.6–1.1)
Glucose: 95 mg/dl (ref 70–140)
POTASSIUM: 3.4 meq/L — AB (ref 3.5–5.1)
SODIUM: 143 meq/L (ref 136–145)
TOTAL PROTEIN: 6.4 g/dL (ref 6.4–8.3)
Total Bilirubin: 0.54 mg/dL (ref 0.20–1.20)

## 2013-06-27 NOTE — Telephone Encounter (Signed)
Patient calling in today to discuss onset of pain from approx 3 days ago. She states it is under right breast/bottom ribs/right flank area. It happens more with movement. She does get relief when lying flat. She called oncall nurse yesterday, and took Lortab at home as instructed. She then had episode of nausea and vomiting that she does not associate with the pain med. She has had burping and indigestion. She has not taken anything for this. Suggested she try Gaviscon or Gas-X to rule out gas.  She still has the pain today and is calling in for an appt since she is going out of town on Thursday to the beach. Dr Jana Hakim is oncall today and will see patient at 65. This has been offered to patient and she will see [place] if the pain is still there following gas relieving meds.
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
## 2013-06-27 NOTE — Telephone Encounter (Signed)
, °

## 2013-06-29 ENCOUNTER — Ambulatory Visit (HOSPITAL_COMMUNITY)
Admission: RE | Admit: 2013-06-29 | Discharge: 2013-06-29 | Disposition: A | Discharge status: Home / Self Care | Payer: Medicare Other | Source: Ambulatory Visit | Attending: Oncology | Admitting: Oncology

## 2013-06-29 DIAGNOSIS — I10 Essential (primary) hypertension: Secondary | ICD-10-CM

## 2013-06-29 DIAGNOSIS — E785 Hyperlipidemia, unspecified: Secondary | ICD-10-CM

## 2013-06-29 DIAGNOSIS — R1011 Right upper quadrant pain: Secondary | ICD-10-CM | POA: Diagnosis not present

## 2013-06-29 DIAGNOSIS — K7689 Other specified diseases of liver: Secondary | ICD-10-CM | POA: Insufficient documentation

## 2013-06-29 DIAGNOSIS — C50919 Malignant neoplasm of unspecified site of unspecified female breast: Secondary | ICD-10-CM | POA: Insufficient documentation

## 2013-06-29 DIAGNOSIS — R110 Nausea: Secondary | ICD-10-CM | POA: Diagnosis not present

## 2013-06-29 DIAGNOSIS — R5383 Other fatigue: Secondary | ICD-10-CM

## 2013-06-29 DIAGNOSIS — C50411 Malignant neoplasm of upper-outer quadrant of right female breast: Secondary | ICD-10-CM

## 2013-06-30 ENCOUNTER — Telehealth: Payer: Self-pay | Admitting: *Deleted

## 2013-06-30 ENCOUNTER — Other Ambulatory Visit: Payer: Self-pay | Admitting: Physician Assistant

## 2013-06-30 NOTE — Telephone Encounter (Signed)
Per 4/1 POF I have adjusted 4/13 appt

## 2013-07-04 ENCOUNTER — Other Ambulatory Visit: Payer: Self-pay

## 2013-07-04 ENCOUNTER — Ambulatory Visit (HOSPITAL_BASED_OUTPATIENT_CLINIC_OR_DEPARTMENT_OTHER): Payer: Medicare Other | Admitting: Oncology

## 2013-07-04 ENCOUNTER — Ambulatory Visit: Payer: Medicare Other

## 2013-07-04 ENCOUNTER — Other Ambulatory Visit: Payer: Medicare Other

## 2013-07-04 ENCOUNTER — Telehealth: Payer: Self-pay | Admitting: Physician Assistant

## 2013-07-04 ENCOUNTER — Ambulatory Visit (HOSPITAL_BASED_OUTPATIENT_CLINIC_OR_DEPARTMENT_OTHER): Payer: Medicare Other

## 2013-07-04 ENCOUNTER — Other Ambulatory Visit: Payer: Self-pay | Admitting: *Deleted

## 2013-07-04 ENCOUNTER — Other Ambulatory Visit (HOSPITAL_BASED_OUTPATIENT_CLINIC_OR_DEPARTMENT_OTHER): Payer: Medicare Other

## 2013-07-04 VITALS — BP 166/75 | HR 73 | Temp 98.5°F | Resp 18 | Ht 64.0 in | Wt 149.5 lb

## 2013-07-04 DIAGNOSIS — D6481 Anemia due to antineoplastic chemotherapy: Secondary | ICD-10-CM

## 2013-07-04 DIAGNOSIS — T451X5A Adverse effect of antineoplastic and immunosuppressive drugs, initial encounter: Secondary | ICD-10-CM

## 2013-07-04 DIAGNOSIS — Z5111 Encounter for antineoplastic chemotherapy: Secondary | ICD-10-CM | POA: Diagnosis not present

## 2013-07-04 DIAGNOSIS — C50419 Malignant neoplasm of upper-outer quadrant of unspecified female breast: Secondary | ICD-10-CM

## 2013-07-04 DIAGNOSIS — D6959 Other secondary thrombocytopenia: Secondary | ICD-10-CM | POA: Diagnosis not present

## 2013-07-04 DIAGNOSIS — Z171 Estrogen receptor negative status [ER-]: Secondary | ICD-10-CM

## 2013-07-04 DIAGNOSIS — C50411 Malignant neoplasm of upper-outer quadrant of right female breast: Secondary | ICD-10-CM

## 2013-07-04 DIAGNOSIS — G629 Polyneuropathy, unspecified: Secondary | ICD-10-CM

## 2013-07-04 DIAGNOSIS — R1011 Right upper quadrant pain: Secondary | ICD-10-CM

## 2013-07-04 DIAGNOSIS — R5383 Other fatigue: Secondary | ICD-10-CM

## 2013-07-04 DIAGNOSIS — I10 Essential (primary) hypertension: Secondary | ICD-10-CM

## 2013-07-04 DIAGNOSIS — D0590 Unspecified type of carcinoma in situ of unspecified breast: Secondary | ICD-10-CM

## 2013-07-04 LAB — COMPREHENSIVE METABOLIC PANEL (CC13)
ALK PHOS: 94 U/L (ref 40–150)
ALT: 32 U/L (ref 0–55)
ANION GAP: 12 meq/L — AB (ref 3–11)
AST: 34 U/L (ref 5–34)
Albumin: 3.9 g/dL (ref 3.5–5.0)
BILIRUBIN TOTAL: 0.53 mg/dL (ref 0.20–1.20)
BUN: 5.4 mg/dL — ABNORMAL LOW (ref 7.0–26.0)
CO2: 25 mEq/L (ref 22–29)
Calcium: 9.9 mg/dL (ref 8.4–10.4)
Chloride: 109 mEq/L (ref 98–109)
Creatinine: 0.7 mg/dL (ref 0.6–1.1)
Glucose: 87 mg/dl (ref 70–140)
Potassium: 3.5 mEq/L (ref 3.5–5.1)
SODIUM: 146 meq/L — AB (ref 136–145)
TOTAL PROTEIN: 6.4 g/dL (ref 6.4–8.3)

## 2013-07-04 LAB — CBC WITH DIFFERENTIAL/PLATELET
BASO%: 0.6 % (ref 0.0–2.0)
BASOS ABS: 0 10*3/uL (ref 0.0–0.1)
EOS ABS: 0.1 10*3/uL (ref 0.0–0.5)
EOS%: 1.8 % (ref 0.0–7.0)
HEMATOCRIT: 28.6 % — AB (ref 34.8–46.6)
HGB: 9.4 g/dL — ABNORMAL LOW (ref 11.6–15.9)
LYMPH#: 0.4 10*3/uL — AB (ref 0.9–3.3)
LYMPH%: 5.4 % — AB (ref 14.0–49.7)
MCH: 31.2 pg (ref 25.1–34.0)
MCHC: 33 g/dL (ref 31.5–36.0)
MCV: 94.4 fL (ref 79.5–101.0)
MONO#: 1.1 10*3/uL — AB (ref 0.1–0.9)
MONO%: 15 % — ABNORMAL HIGH (ref 0.0–14.0)
NEUT%: 77.2 % — AB (ref 38.4–76.8)
NEUTROS ABS: 5.4 10*3/uL (ref 1.5–6.5)
PLATELETS: 197 10*3/uL (ref 145–400)
RBC: 3.03 10*6/uL — ABNORMAL LOW (ref 3.70–5.45)
RDW: 24.2 % — ABNORMAL HIGH (ref 11.2–14.5)
WBC: 7 10*3/uL (ref 3.9–10.3)

## 2013-07-04 LAB — LIPASE: Lipase: 28 U/L (ref 0–75)

## 2013-07-04 LAB — AMYLASE: Amylase: 20 U/L (ref 0–105)

## 2013-07-04 MED ORDER — SODIUM CHLORIDE 0.9 % IV SOLN
Freq: Once | INTRAVENOUS | Status: AC
Start: 2013-07-04 — End: 2013-07-04
  Administered 2013-07-04: 11:00:00 via INTRAVENOUS

## 2013-07-04 MED ORDER — HEPARIN SOD (PORK) LOCK FLUSH 100 UNIT/ML IV SOLN
500.0000 [IU] | Freq: Once | INTRAVENOUS | Status: AC | PRN
  Administered 2013-07-04: 500 [IU]
  Filled 2013-07-04: qty 5

## 2013-07-04 MED ORDER — DEXAMETHASONE SODIUM PHOSPHATE 20 MG/5ML IJ SOLN
20.0000 mg | Freq: Once | INTRAMUSCULAR | Status: AC
  Administered 2013-07-04: 20 mg via INTRAVENOUS

## 2013-07-04 MED ORDER — SODIUM CHLORIDE 0.9 % IV SOLN
800.0000 mg/m2 | Freq: Once | INTRAVENOUS | Status: AC
  Administered 2013-07-04: 1444 mg via INTRAVENOUS
  Filled 2013-07-04: qty 37.98

## 2013-07-04 MED ORDER — CARBOPLATIN CHEMO INJECTION 450 MG/45ML
360.7800 mg | Freq: Once | INTRAVENOUS | Status: AC
  Administered 2013-07-04: 360 mg via INTRAVENOUS
  Filled 2013-07-04: qty 36

## 2013-07-04 MED ORDER — ONDANSETRON 16 MG/50ML IVPB (CHCC)
INTRAVENOUS | Status: AC
  Filled 2013-07-04: qty 16

## 2013-07-04 MED ORDER — DEXAMETHASONE SODIUM PHOSPHATE 20 MG/5ML IJ SOLN
INTRAMUSCULAR | Status: AC
  Filled 2013-07-04: qty 5

## 2013-07-04 MED ORDER — SODIUM CHLORIDE 0.9 % IJ SOLN
10.0000 mL | INTRAMUSCULAR | Status: DC | PRN
  Administered 2013-07-04: 10 mL
  Filled 2013-07-04: qty 10

## 2013-07-04 MED ORDER — ONDANSETRON 16 MG/50ML IVPB (CHCC)
16.0000 mg | Freq: Once | INTRAVENOUS | Status: AC
  Administered 2013-07-04: 16 mg via INTRAVENOUS

## 2013-07-04 NOTE — Patient Instructions (Signed)
[address] Discharge Instructions for Patients Receiving Chemotherapy  Today you received the following chemotherapy agents: carboplatin, gemzar  To help prevent nausea and vomiting after your treatment, we encourage you to take your nausea medication.  Take it as often as prescribed.     If you develop nausea and vomiting that is not controlled by your nausea medication, call the clinic. If it is after clinic hours your family physician or the after hours number for the clinic or go to the Emergency Department.   BELOW ARE SYMPTOMS THAT SHOULD BE REPORTED IMMEDIATELY:  *FEVER GREATER THAN 100.5 F  *CHILLS WITH OR WITHOUT FEVER  NAUSEA AND VOMITING THAT IS NOT CONTROLLED WITH YOUR NAUSEA MEDICATION  *UNUSUAL SHORTNESS OF BREATH  *UNUSUAL BRUISING OR BLEEDING  TENDERNESS IN MOUTH AND THROAT WITH OR WITHOUT PRESENCE OF ULCERS  *URINARY PROBLEMS  *BOWEL PROBLEMS  UNUSUAL RASH Items with * indicate a potential emergency and should be followed up as soon as possible.  Feel free to call the clinic you have any questions or concerns. The clinic phone number is [phone].   I have been informed and understand all the instructions given to me. I know to contact the clinic, my physician, or go to the Emergency Department if any problems should occur. I do not have any questions at this time, but understand that I may call the clinic during office hours   should I have any questions or need assistance in obtaining follow up care.    __________________________________________  _____________  __________ Signature of Patient or Authorized Representative            Date                   Time    __________________________________________ Nurse's Signature
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
## 2013-07-04 NOTE — Telephone Encounter (Signed)
, °

## 2013-07-05 ENCOUNTER — Telehealth: Payer: Self-pay | Admitting: *Deleted

## 2013-07-05 ENCOUNTER — Ambulatory Visit (INDEPENDENT_AMBULATORY_CARE_PROVIDER_SITE_OTHER): Payer: Medicare Other | Admitting: General Surgery

## 2013-07-05 ENCOUNTER — Ambulatory Visit (HOSPITAL_COMMUNITY): Payer: Medicare Other

## 2013-07-05 NOTE — Telephone Encounter (Signed)
Pt called to this RN to inquire what medications she should be taking after new chemo.  Per review of meds on hands including dexamethasone, ativan, zofran and compazine- this RN informed pt dexamethasone not needed with this regiman.  Discussed other meds above and  prn use for symptom management.

## 2013-07-06 ENCOUNTER — Emergency Department (HOSPITAL_COMMUNITY): Payer: Medicare Other

## 2013-07-06 ENCOUNTER — Telehealth: Payer: Self-pay | Admitting: *Deleted

## 2013-07-06 ENCOUNTER — Encounter (HOSPITAL_COMMUNITY): Payer: Self-pay | Admitting: Emergency Medicine

## 2013-07-06 ENCOUNTER — Emergency Department (HOSPITAL_COMMUNITY)
Admission: EM | Admit: 2013-07-06 | Discharge: 2013-07-06 | Disposition: A | Discharge status: Home / Self Care | Payer: Medicare Other | Attending: Emergency Medicine | Admitting: Emergency Medicine

## 2013-07-06 DIAGNOSIS — Z8742 Personal history of other diseases of the female genital tract: Secondary | ICD-10-CM | POA: Diagnosis not present

## 2013-07-06 DIAGNOSIS — Z79899 Other long term (current) drug therapy: Secondary | ICD-10-CM | POA: Insufficient documentation

## 2013-07-06 DIAGNOSIS — Z87891 Personal history of nicotine dependence: Secondary | ICD-10-CM | POA: Insufficient documentation

## 2013-07-06 DIAGNOSIS — Z792 Long term (current) use of antibiotics: Secondary | ICD-10-CM | POA: Insufficient documentation

## 2013-07-06 DIAGNOSIS — R609 Edema, unspecified: Secondary | ICD-10-CM | POA: Diagnosis not present

## 2013-07-06 DIAGNOSIS — E039 Hypothyroidism, unspecified: Secondary | ICD-10-CM | POA: Diagnosis not present

## 2013-07-06 DIAGNOSIS — Z853 Personal history of malignant neoplasm of breast: Secondary | ICD-10-CM | POA: Insufficient documentation

## 2013-07-06 DIAGNOSIS — Z8739 Personal history of other diseases of the musculoskeletal system and connective tissue: Secondary | ICD-10-CM | POA: Diagnosis not present

## 2013-07-06 DIAGNOSIS — I10 Essential (primary) hypertension: Secondary | ICD-10-CM | POA: Diagnosis not present

## 2013-07-06 DIAGNOSIS — E7800 Pure hypercholesterolemia, unspecified: Secondary | ICD-10-CM | POA: Insufficient documentation

## 2013-07-06 DIAGNOSIS — E785 Hyperlipidemia, unspecified: Secondary | ICD-10-CM | POA: Insufficient documentation

## 2013-07-06 MED ORDER — FUROSEMIDE 20 MG PO TABS: 20.0000 mg | ORAL_TABLET | Freq: Every day | ORAL | Status: DC

## 2013-07-06 NOTE — ED Provider Notes (Signed)
TIME SEEN: 9:14 PM  CHIEF COMPLAINT: Bilateral lower extremity swelling  HPI: Patient is a 67 y.o. F with history of hypertension, hyperthyroidism, hyperlipidemia, breast cancer who recently received her second chemotherapy treatment 2 days ago who presents emergency Department bilateral swelling of her feet. She states that today she had a very busy day and was running multiple areas. When she got home she noticed swelling of her feet. She adamantly denies that she is having any chest pain or shortness of breath despite triage note. No fevers. No pain. No numbness or weakness. No history of injury. No history of CHF. History of PE or DVT. Patient reports she called the oncology chairs nurse instructed her to come to the emergency department.  ROS: See HPI Constitutional: no fever  Eyes: no drainage  ENT: no runny nose   Cardiovascular:  no chest pain  Resp: no SOB  GI: no vomiting GU: no dysuria Integumentary: no rash  Allergy: no hives  Musculoskeletal:  leg swelling  Neurological: no slurred speech ROS otherwise negative  PAST MEDICAL HISTORY/PAST SURGICAL HISTORY:  Past Medical History  Diagnosis Date  . Hypertension   . Thyroid disease     hypothyroid  . High cholesterol   . Fibroid   . Breast cancer   . Arthritis     MEDICATIONS:  Prior to Admission medications   Medication Sig Start Date End Date Taking? Authorizing Provider  Cholecalciferol (VITAMIN D) 2000 UNITS tablet Take 2,000 Units by mouth daily.    Historical Provider, MD  Coenzyme Q10 300 MG CAPS Take 300 mg by mouth daily.     Historical Provider, MD  dexamethasone (DECADRON) 4 MG tablet  03/17/13   Historical Provider, MD  doxycycline (VIBRA-TABS) 100 MG tablet Take 1 tablet (100 mg total) by mouth daily. 06/13/13   Amy Milda Smart, PA-C  hydrochlorothiazide (HYDRODIURIL) 25 MG tablet Take 12.5 mg by mouth daily.     Historical Provider, MD  Javier Docker Oil (MAXIMUM RED KRILL PO) Take 500 mg by mouth daily.     Historical Provider, MD  levothyroxine (SYNTHROID, LEVOTHROID) 100 MCG tablet Take 100 mcg by mouth daily.    Historical Provider, MD  lidocaine-prilocaine (EMLA) cream Apply topically 1.5 hours before use as directed.  Cover cream with plastic. 03/22/13   Chauncey Cruel, MD  LORazepam (ATIVAN) 0.5 MG tablet  03/18/13   Historical Provider, MD  Magnesium 100 MG TABS Take 200 mg by mouth daily.     Historical Provider, MD  omeprazole (PRILOSEC) 20 MG capsule Take 1 capsule (20 mg total) by mouth daily. 05/02/13   Wilmon Arms, MD  prochlorperazine (COMPAZINE) 10 MG tablet  03/17/13   Historical Provider, MD  traMADol (ULTRAM) 50 MG tablet Take 1-2 tablets (50-100 mg total) by mouth every 6 (six) hours as needed. 03/28/13   Chauncey Cruel, MD  vitamin C (ASCORBIC ACID) 500 MG tablet Take 1,000 mg by mouth daily.     Historical Provider, MD    ALLERGIES:  Allergies  Allergen Reactions  . Codeine Nausea And Vomiting  . Statins Other (See Comments)    (Including Red Yeast Rice) Causes muscle cramps  . Ciprofloxacin Rash    SOCIAL HISTORY:  History  Substance Use Topics  . Smoking status: Former Smoker    Quit date: 03/25/1985  . Smokeless tobacco: Never Used  . Alcohol Use: Yes     Comment: Occas    FAMILY HISTORY: Family History  Problem Relation Age of Onset  .  Suicidality Mother   . Heart attack Father   . Congestive Heart Failure Father   . Heart disease Father   . Cancer Maternal Uncle     unknown  . Kidney disease Maternal Grandfather   . Heart attack Maternal Uncle     MI age 67-50  . Breast cancer Paternal Aunt 57    EXAM: BP 162/80  Pulse 78  Temp(Src) 98.2 F (36.8 C) (Oral)  Resp 18  Ht 5\' 4"  (1.626 m)  Wt 149 lb (67.586 kg)  BMI 25.56 kg/m2  SpO2 98% CONSTITUTIONAL: Alert and oriented and responds appropriately to questions. Well-appearing; well-nourished HEAD: Normocephalic EYES: Conjunctivae clear, PERRL ENT: normal nose; no rhinorrhea; moist  mucous membranes; pharynx without lesions noted NECK: Supple, no meningismus, no LAD  CARD: RRR; S1 and S2 appreciated; no murmurs, no clicks, no rubs, no gallops RESP: Normal chest excursion without splinting or tachypnea; breath sounds clear and equal bilaterally; no wheezes, no rhonchi, no rales,  ABD/GI: Normal bowel sounds; non-distended; soft, non-tender, no rebound, no guarding BACK:  The back appears normal and is non-tender to palpation, there is no CVA tenderness EXT: Normal ROM in all joints; non-tender to palpation; no edema; normal capillary refill; no cyanosis; 1+ nonpitting edema and bilateral dorsal feet and ankles, no calf tenderness, 2+ DP pulses bilaterally, feet are warm and well-perfused, no rashes or wounds, no joint effusion, full range of motion in all joints, normal gait SKIN: Normal color for age and race; warm NEURO: Moves all extremities equally PSYCH: The patient's mood and manner are appropriate. Grooming and personal hygiene are appropriate.  MEDICAL DECISION MAKING: Patient here with peripheral edema of her bilateral lower extremities. Discussed with patient that this may be due to his peripheral vascular disease and from being on her feet for an extended period of time today. She has no unilateral swelling or calf tenderness to suggest a DVT. No signs of infection on exam. Have recommended basic blood work which patient denies. Will start patient on low-dose Lasix as needed for peripheral swelling and have her wear compression stockings and followup with her oncologist. She does not want any further workup in the emergency department. Discussed return precautions, supportive care instructions. Patient verbalizes understanding is comfortable plan.      [place], DO 07/06/13 2144

## 2013-07-06 NOTE — ED Notes (Signed)
Pt reports chemo on Monday and noticed swelling of both feet. Pt denies fever and states that she recently began having SOB where she gets winded. Pt alert and ambulatory in triage area. Pt able to speak in complete sentences. Sats normal.

## 2013-07-06 NOTE — Discharge Instructions (Signed)

## 2013-07-06 NOTE — Telephone Encounter (Signed)
I have adjusted 5/4 appt

## 2013-07-07 ENCOUNTER — Telehealth: Payer: Self-pay | Admitting: *Deleted

## 2013-07-07 NOTE — Telephone Encounter (Signed)
Pt called to this RN to discuss recommendation per call to office late yesterday for bilateral ankle swelling " and your recommendation to proceed to the ER."  This RN clarified with pt of time she call as 6pm and informed her nurse she spoke was an outside service used for after hours call "  " well that was a big waste of time and money going to the ER "  " they wanted to do labs that I just had done and xrays which I declined - so they prescribed lasix which my understanding will deplete my potassium and I don't need to be doing that" " all I really wanted to know is if this was a side effect from my chemotherapy ?"  Per further inquiry and discussion with this RN - pt states swelling is bilateral and equal, no redness, or noted skin breakdown. Swelling is dependent and decreases with elevation.  Shannon Grant has not taken the lasix due to " concerns of it's side effects "  Per discussion with this RN - pt will elevate legs 3 times a day with ankle higher then hip as well as elevate foot of bed.  This note will be given to provider for review and any other recommendations needed prior to next office visit on Monday April 13th.

## 2013-07-11 ENCOUNTER — Ambulatory Visit (HOSPITAL_BASED_OUTPATIENT_CLINIC_OR_DEPARTMENT_OTHER): Payer: Medicare Other | Admitting: Physician Assistant

## 2013-07-11 ENCOUNTER — Ambulatory Visit (HOSPITAL_BASED_OUTPATIENT_CLINIC_OR_DEPARTMENT_OTHER): Payer: Medicare Other

## 2013-07-11 ENCOUNTER — Other Ambulatory Visit (HOSPITAL_BASED_OUTPATIENT_CLINIC_OR_DEPARTMENT_OTHER): Payer: Medicare Other

## 2013-07-11 ENCOUNTER — Other Ambulatory Visit: Payer: Medicare Other

## 2013-07-11 ENCOUNTER — Encounter: Payer: Self-pay | Admitting: Physician Assistant

## 2013-07-11 VITALS — BP 134/78 | HR 78 | Temp 98.7°F | Resp 18 | Ht 64.0 in | Wt 146.5 lb

## 2013-07-11 DIAGNOSIS — G5790 Unspecified mononeuropathy of unspecified lower limb: Secondary | ICD-10-CM

## 2013-07-11 DIAGNOSIS — Z171 Estrogen receptor negative status [ER-]: Secondary | ICD-10-CM | POA: Diagnosis not present

## 2013-07-11 DIAGNOSIS — M7989 Other specified soft tissue disorders: Secondary | ICD-10-CM

## 2013-07-11 DIAGNOSIS — C50411 Malignant neoplasm of upper-outer quadrant of right female breast: Secondary | ICD-10-CM

## 2013-07-11 DIAGNOSIS — Z5111 Encounter for antineoplastic chemotherapy: Secondary | ICD-10-CM | POA: Diagnosis not present

## 2013-07-11 DIAGNOSIS — D6481 Anemia due to antineoplastic chemotherapy: Secondary | ICD-10-CM

## 2013-07-11 DIAGNOSIS — H538 Other visual disturbances: Secondary | ICD-10-CM

## 2013-07-11 DIAGNOSIS — R630 Anorexia: Secondary | ICD-10-CM

## 2013-07-11 DIAGNOSIS — R600 Localized edema: Secondary | ICD-10-CM | POA: Insufficient documentation

## 2013-07-11 DIAGNOSIS — C50419 Malignant neoplasm of upper-outer quadrant of unspecified female breast: Secondary | ICD-10-CM

## 2013-07-11 DIAGNOSIS — T451X5A Adverse effect of antineoplastic and immunosuppressive drugs, initial encounter: Secondary | ICD-10-CM

## 2013-07-11 DIAGNOSIS — G5690 Unspecified mononeuropathy of unspecified upper limb: Secondary | ICD-10-CM | POA: Diagnosis not present

## 2013-07-11 DIAGNOSIS — R5383 Other fatigue: Secondary | ICD-10-CM

## 2013-07-11 DIAGNOSIS — G629 Polyneuropathy, unspecified: Secondary | ICD-10-CM

## 2013-07-11 LAB — COMPREHENSIVE METABOLIC PANEL (CC13)
ALBUMIN: 3.9 g/dL (ref 3.5–5.0)
ALT: 88 U/L — AB (ref 0–55)
ANION GAP: 8 meq/L (ref 3–11)
AST: 79 U/L — AB (ref 5–34)
Alkaline Phosphatase: 77 U/L (ref 40–150)
BUN: 7.2 mg/dL (ref 7.0–26.0)
CALCIUM: 10 mg/dL (ref 8.4–10.4)
CHLORIDE: 106 meq/L (ref 98–109)
CO2: 26 meq/L (ref 22–29)
Creatinine: 0.7 mg/dL (ref 0.6–1.1)
Glucose: 98 mg/dl (ref 70–140)
POTASSIUM: 3.9 meq/L (ref 3.5–5.1)
Sodium: 141 mEq/L (ref 136–145)
Total Bilirubin: 0.59 mg/dL (ref 0.20–1.20)
Total Protein: 6.3 g/dL — ABNORMAL LOW (ref 6.4–8.3)

## 2013-07-11 LAB — CBC WITH DIFFERENTIAL/PLATELET
BASO%: 0.8 % (ref 0.0–2.0)
BASOS ABS: 0 10*3/uL (ref 0.0–0.1)
EOS%: 1 % (ref 0.0–7.0)
Eosinophils Absolute: 0 10*3/uL (ref 0.0–0.5)
HEMATOCRIT: 28.2 % — AB (ref 34.8–46.6)
HEMOGLOBIN: 9.3 g/dL — AB (ref 11.6–15.9)
LYMPH#: 0.3 10*3/uL — AB (ref 0.9–3.3)
LYMPH%: 6 % — ABNORMAL LOW (ref 14.0–49.7)
MCH: 31.2 pg (ref 25.1–34.0)
MCHC: 32.9 g/dL (ref 31.5–36.0)
MCV: 94.8 fL (ref 79.5–101.0)
MONO#: 0.4 10*3/uL (ref 0.1–0.9)
MONO%: 8.2 % (ref 0.0–14.0)
NEUT%: 84 % — AB (ref 38.4–76.8)
NEUTROS ABS: 4.1 10*3/uL (ref 1.5–6.5)
Platelets: 177 10*3/uL (ref 145–400)
RBC: 2.97 10*6/uL — ABNORMAL LOW (ref 3.70–5.45)
RDW: 21.5 % — AB (ref 11.2–14.5)
WBC: 4.9 10*3/uL (ref 3.9–10.3)

## 2013-07-11 MED ORDER — SODIUM CHLORIDE 0.9 % IV SOLN
Freq: Once | INTRAVENOUS | Status: AC
  Administered 2013-07-11: 11:00:00 via INTRAVENOUS

## 2013-07-11 MED ORDER — PROCHLORPERAZINE MALEATE 10 MG PO TABS
ORAL_TABLET | ORAL | Status: AC
  Filled 2013-07-11: qty 1

## 2013-07-11 MED ORDER — HEPARIN SOD (PORK) LOCK FLUSH 100 UNIT/ML IV SOLN
500.0000 [IU] | Freq: Once | INTRAVENOUS | Status: AC | PRN
  Administered 2013-07-11: 500 [IU]
  Filled 2013-07-11: qty 5

## 2013-07-11 MED ORDER — PROCHLORPERAZINE MALEATE 10 MG PO TABS
10.0000 mg | ORAL_TABLET | Freq: Once | ORAL | Status: AC
  Administered 2013-07-11: 10 mg via ORAL

## 2013-07-11 MED ORDER — SODIUM CHLORIDE 0.9 % IJ SOLN
10.0000 mL | INTRAMUSCULAR | Status: DC | PRN
  Administered 2013-07-11: 10 mL
  Filled 2013-07-11: qty 10

## 2013-07-11 MED ORDER — GEMCITABINE HCL CHEMO INJECTION 1 GM/26.3ML
800.0000 mg/m2 | Freq: Once | INTRAVENOUS | Status: AC
  Administered 2013-07-11: 1444 mg via INTRAVENOUS
  Filled 2013-07-11: qty 37.98
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
## 2013-07-11 NOTE — Patient Instructions (Signed)
[facility] Discharge Instructions for Patients Receiving Chemotherapy  Today you received the following chemotherapy agent Gemzar.  To help prevent nausea and vomiting after your treatment, we encourage you to take your nausea medication.   If you develop nausea and vomiting that is not controlled by your nausea medication, call the clinic.   BELOW ARE SYMPTOMS THAT SHOULD BE REPORTED IMMEDIATELY:  *FEVER GREATER THAN 100.5 F  *CHILLS WITH OR WITHOUT FEVER  NAUSEA AND VOMITING THAT IS NOT CONTROLLED WITH YOUR NAUSEA MEDICATION  *UNUSUAL SHORTNESS OF BREATH  *UNUSUAL BRUISING OR BLEEDING  TENDERNESS IN MOUTH AND THROAT WITH OR WITHOUT PRESENCE OF ULCERS  *URINARY PROBLEMS  *BOWEL PROBLEMS  UNUSUAL RASH Items with * indicate a potential emergency and should be followed up as soon as possible.  Feel free to call the clinic you have any questions or concerns. The clinic phone number is [phone].    

## 2013-07-12 ENCOUNTER — Ambulatory Visit (HOSPITAL_BASED_OUTPATIENT_CLINIC_OR_DEPARTMENT_OTHER): Payer: Medicare Other

## 2013-07-12 VITALS — BP 134/71 | HR 85 | Temp 98.4°F

## 2013-07-12 DIAGNOSIS — Z5189 Encounter for other specified aftercare: Secondary | ICD-10-CM | POA: Diagnosis not present

## 2013-07-12 DIAGNOSIS — C50411 Malignant neoplasm of upper-outer quadrant of right female breast: Secondary | ICD-10-CM

## 2013-07-12 DIAGNOSIS — C50419 Malignant neoplasm of upper-outer quadrant of unspecified female breast: Secondary | ICD-10-CM | POA: Diagnosis not present

## 2013-07-12 MED ORDER — PEGFILGRASTIM INJECTION 6 MG/0.6ML
6.0000 mg | Freq: Once | SUBCUTANEOUS | Status: AC
  Administered 2013-07-12: 6 mg via SUBCUTANEOUS
  Filled 2013-07-12: qty 0.6

## 2013-07-18 ENCOUNTER — Other Ambulatory Visit (HOSPITAL_BASED_OUTPATIENT_CLINIC_OR_DEPARTMENT_OTHER): Payer: Medicare Other

## 2013-07-18 ENCOUNTER — Ambulatory Visit: Payer: Medicare Other

## 2013-07-18 DIAGNOSIS — D6481 Anemia due to antineoplastic chemotherapy: Secondary | ICD-10-CM

## 2013-07-18 DIAGNOSIS — T451X5A Adverse effect of antineoplastic and immunosuppressive drugs, initial encounter: Principal | ICD-10-CM

## 2013-07-18 DIAGNOSIS — C50419 Malignant neoplasm of upper-outer quadrant of unspecified female breast: Secondary | ICD-10-CM

## 2013-07-18 DIAGNOSIS — C50411 Malignant neoplasm of upper-outer quadrant of right female breast: Secondary | ICD-10-CM

## 2013-07-18 LAB — CBC WITH DIFFERENTIAL/PLATELET
BASO%: 0.1 % (ref 0.0–2.0)
BASOS ABS: 0 10*3/uL (ref 0.0–0.1)
EOS%: 0.1 % (ref 0.0–7.0)
Eosinophils Absolute: 0 10*3/uL (ref 0.0–0.5)
HCT: 23.6 % — ABNORMAL LOW (ref 34.8–46.6)
HEMOGLOBIN: 7.7 g/dL — AB (ref 11.6–15.9)
LYMPH#: 0.3 10*3/uL — AB (ref 0.9–3.3)
LYMPH%: 2.7 % — ABNORMAL LOW (ref 14.0–49.7)
MCH: 30.7 pg (ref 25.1–34.0)
MCHC: 32.6 g/dL (ref 31.5–36.0)
MCV: 94 fL (ref 79.5–101.0)
MONO#: 0.9 10*3/uL (ref 0.1–0.9)
MONO%: 7.8 % (ref 0.0–14.0)
NEUT%: 89.3 % — ABNORMAL HIGH (ref 38.4–76.8)
NEUTROS ABS: 10.6 10*3/uL — AB (ref 1.5–6.5)
Platelets: 14 10*3/uL — ABNORMAL LOW (ref 145–400)
RBC: 2.51 10*6/uL — AB (ref 3.70–5.45)
RDW: 18.8 % — AB (ref 11.2–14.5)
WBC: 11.9 10*3/uL — ABNORMAL HIGH (ref 3.9–10.3)
nRBC: 0 % (ref 0–0)

## 2013-07-18 NOTE — Progress Notes (Signed)
Subjective:     Patient ID: Shannon Grant, female   DOB: 04-24-1945, 68 y.o.   MRN: [id number]  HPI The patient is a 68 year old female who is 3 months status post right lumpectomy and negative sentinel node biopsy for a right-sided breast cancer. Her pathology showed that the area that was positive was an intramammary lymph node. Her sentinel node was negative. No other areas of concern were identified on her mammograms were MRI studies. Her only complaint is some soreness at the lumpectomy site. She denies any fevers or chills.  Review of Systems  Constitutional: Negative.   HENT: Negative.   Eyes: Negative.   Respiratory: Negative.   Cardiovascular: Negative.   Gastrointestinal: Negative.   Endocrine: Negative.   Genitourinary: Negative.   Musculoskeletal: Negative.   Skin: Negative.   Allergic/Immunologic: Negative.   Neurological: Negative.   Hematological: Negative.   Psychiatric/Behavioral: Negative.        Objective:   Physical Exam  Constitutional: She is oriented to person, place, and time. She appears well-developed and well-nourished.  HENT:  Head: Normocephalic and atraumatic.  Eyes: Conjunctivae and EOM are normal. Pupils are equal, round, and reactive to light.  Neck: Normal range of motion. Neck supple.  Cardiovascular: Normal rate, regular rhythm and normal heart sounds.   Pulmonary/Chest: Effort normal and breath sounds normal.  There is some fullness in her lumpectomy scar consistent with a seroma. There is no redness or cellulitis. There is no palpable mass of the left breast. There is no palpable axillary, supraclavicular, or cervical lymphadenopathy  Abdominal: Soft. Bowel sounds are normal.  Musculoskeletal: Normal range of motion.  Lymphadenopathy:    She has no cervical adenopathy.  Neurological: She is alert and oriented to person, place, and time.  Skin: Skin is warm and dry.  Psychiatric: She has a normal mood and affect. Her behavior is normal.        Assessment:     The patient is 3 months status post right lumpectomy with a palpable seroma at the operative site but no sign of infection     Plan:     At this point I have offered try to have the seroma aspirated but she is not interested in this at the moment. She will continue to do regular self exams. I will plan to see her back in about 3 months.

## 2013-07-25 ENCOUNTER — Ambulatory Visit (HOSPITAL_BASED_OUTPATIENT_CLINIC_OR_DEPARTMENT_OTHER): Payer: Medicare Other | Admitting: Oncology

## 2013-07-25 ENCOUNTER — Ambulatory Visit (HOSPITAL_COMMUNITY)
Admission: RE | Admit: 2013-07-25 | Discharge: 2013-07-25 | Disposition: A | Discharge status: Home / Self Care | Payer: Medicare Other | Source: Ambulatory Visit | Attending: Oncology | Admitting: Oncology

## 2013-07-25 ENCOUNTER — Other Ambulatory Visit: Payer: Self-pay | Admitting: *Deleted

## 2013-07-25 ENCOUNTER — Ambulatory Visit (HOSPITAL_BASED_OUTPATIENT_CLINIC_OR_DEPARTMENT_OTHER): Payer: Medicare Other

## 2013-07-25 ENCOUNTER — Other Ambulatory Visit (HOSPITAL_BASED_OUTPATIENT_CLINIC_OR_DEPARTMENT_OTHER): Payer: Medicare Other

## 2013-07-25 ENCOUNTER — Other Ambulatory Visit: Payer: Medicare Other

## 2013-07-25 ENCOUNTER — Telehealth: Payer: Self-pay | Admitting: Oncology

## 2013-07-25 ENCOUNTER — Encounter: Payer: Self-pay | Admitting: Radiation Oncology

## 2013-07-25 VITALS — BP 168/82 | HR 83 | Temp 98.6°F | Resp 18 | Ht 64.0 in | Wt 144.1 lb

## 2013-07-25 VITALS — BP 155/79 | HR 61 | Temp 97.9°F | Resp 17

## 2013-07-25 DIAGNOSIS — C50411 Malignant neoplasm of upper-outer quadrant of right female breast: Secondary | ICD-10-CM

## 2013-07-25 DIAGNOSIS — T451X5A Adverse effect of antineoplastic and immunosuppressive drugs, initial encounter: Principal | ICD-10-CM

## 2013-07-25 DIAGNOSIS — G629 Polyneuropathy, unspecified: Secondary | ICD-10-CM

## 2013-07-25 DIAGNOSIS — Z171 Estrogen receptor negative status [ER-]: Secondary | ICD-10-CM | POA: Diagnosis not present

## 2013-07-25 DIAGNOSIS — C50419 Malignant neoplasm of upper-outer quadrant of unspecified female breast: Secondary | ICD-10-CM

## 2013-07-25 DIAGNOSIS — Z9849 Cataract extraction status, unspecified eye: Secondary | ICD-10-CM

## 2013-07-25 DIAGNOSIS — D649 Anemia, unspecified: Secondary | ICD-10-CM

## 2013-07-25 DIAGNOSIS — D6481 Anemia due to antineoplastic chemotherapy: Secondary | ICD-10-CM

## 2013-07-25 DIAGNOSIS — C50919 Malignant neoplasm of unspecified site of unspecified female breast: Secondary | ICD-10-CM | POA: Diagnosis not present

## 2013-07-25 LAB — CBC WITH DIFFERENTIAL/PLATELET
BASO%: 0.4 % (ref 0.0–2.0)
Basophils Absolute: 0.1 10*3/uL (ref 0.0–0.1)
EOS ABS: 0.1 10*3/uL (ref 0.0–0.5)
EOS%: 1.1 % (ref 0.0–7.0)
HCT: 25.6 % — ABNORMAL LOW (ref 34.8–46.6)
HGB: 8.5 g/dL — ABNORMAL LOW (ref 11.6–15.9)
LYMPH%: 2.8 % — ABNORMAL LOW (ref 14.0–49.7)
MCH: 31.4 pg (ref 25.1–34.0)
MCHC: 33 g/dL (ref 31.5–36.0)
MCV: 95.2 fL (ref 79.5–101.0)
MONO#: 1.5 10*3/uL — AB (ref 0.1–0.9)
MONO%: 13.4 % (ref 0.0–14.0)
NEUT%: 82.3 % — ABNORMAL HIGH (ref 38.4–76.8)
NEUTROS ABS: 9.4 10*3/uL — AB (ref 1.5–6.5)
Platelets: 294 10*3/uL (ref 145–400)
RBC: 2.69 10*6/uL — ABNORMAL LOW (ref 3.70–5.45)
RDW: 21.4 % — AB (ref 11.2–14.5)
WBC: 11.4 10*3/uL — AB (ref 3.9–10.3)
lymph#: 0.3 10*3/uL — ABNORMAL LOW (ref 0.9–3.3)

## 2013-07-25 LAB — COMPREHENSIVE METABOLIC PANEL
ALBUMIN: 3.5 g/dL (ref 3.5–5.2)
ALT: 16 U/L (ref 0–35)
AST: 23 U/L (ref 0–37)
Alkaline Phosphatase: 94 U/L (ref 39–117)
BILIRUBIN TOTAL: 0.4 mg/dL (ref 0.3–1.2)
BUN: 9 mg/dL (ref 6–23)
CHLORIDE: 94 meq/L — AB (ref 96–112)
CO2: 28 mEq/L (ref 19–32)
Calcium: 9.9 mg/dL (ref 8.4–10.5)
Creatinine, Ser: 0.96 mg/dL (ref 0.50–1.10)
GLUCOSE: 96 mg/dL (ref 70–99)
Potassium: 4.2 mEq/L (ref 3.5–5.3)
SODIUM: 136 meq/L (ref 135–145)
TOTAL PROTEIN: 6.2 g/dL (ref 6.0–8.3)

## 2013-07-25 LAB — PREPARE RBC (CROSSMATCH)

## 2013-07-25 MED ORDER — ACETAMINOPHEN 325 MG PO TABS
650.0000 mg | ORAL_TABLET | Freq: Once | ORAL | Status: AC
  Administered 2013-07-25: 650 mg via ORAL

## 2013-07-25 MED ORDER — SODIUM CHLORIDE 0.9 % IJ SOLN
10.0000 mL | INTRAMUSCULAR | Status: AC | PRN
  Administered 2013-07-25: 10 mL
  Filled 2013-07-25: qty 10

## 2013-07-25 MED ORDER — DIPHENHYDRAMINE HCL 25 MG PO CAPS
ORAL_CAPSULE | ORAL | Status: AC
  Filled 2013-07-25: qty 1

## 2013-07-25 MED ORDER — HEPARIN SOD (PORK) LOCK FLUSH 100 UNIT/ML IV SOLN
500.0000 [IU] | Freq: Every day | INTRAVENOUS | Status: AC | PRN
  Administered 2013-07-25: 500 [IU]
  Filled 2013-07-25: qty 5

## 2013-07-25 MED ORDER — SODIUM CHLORIDE 0.9 % IV SOLN
250.0000 mL | Freq: Once | INTRAVENOUS | Status: AC
  Administered 2013-07-25: 250 mL via INTRAVENOUS

## 2013-07-25 MED ORDER — ACETAMINOPHEN 325 MG PO TABS
ORAL_TABLET | ORAL | Status: AC
  Filled 2013-07-25: qty 2

## 2013-07-25 MED ORDER — DIPHENHYDRAMINE HCL 25 MG PO CAPS
25.0000 mg | ORAL_CAPSULE | Freq: Once | ORAL | Status: AC
  Administered 2013-07-25: 25 mg via ORAL

## 2013-07-25 NOTE — Progress Notes (Signed)
Location of Breast Cancer: right 11:00 o'clock position  Histology per Pathology Report:  03/03/13 Diagnosis 1. Lymph node, sentinel, biopsy, Right axillary #1 - ONE BENIGN LYMPH NODE (0/1). 2. Breast, lumpectomy, Right - LYMPH NODE WITH METASTATIC CARCINOMA. - SEE MICROSCOPIC DESCRIPTION.  01/25/13 Diagnosis Breast, right, needle core biopsy - INVASIVE MAMMARY CARCINOMA, SEE COMMENT.  Receptor Status: ER(-), PR (-), Her2-neu (-)  Did patient present with symptoms (if so, please note symptoms) or was this found on screening mammography?: screening mammogram  Past/Anticipated interventions by surgeon, if any: 03/03/13 right lumpectomy, axillary node sample  Past/Anticipated interventions by medical oncology, if any: Chemotherapy , Dr Jana Hakim: adjuvant chemotherapy  (a) dose dense doxorubicin and cyclophosphamide x4, with Neulasta support, started 04/04/2012, completed 02/16/20115.  (b) the decision was made to hold paclitaxel due to development of peripheral neuropathy.  (c) on 06/13/2013 started carboplatin/gemcitabine, with the carboplatin given at an AUC of 5 day 1, and the gemcitabine given at a dose of 800 mg per meter square days 1 and 8, neulasta day 9  (d) carboplatin dose reduced 15% starting with cycle 2 due to thrombocytopenia  (e) completed two cycles 07/11/2013  Adjuvant radiation to follow chemotherapy.  Lymphedema issues, if any:  No, patient states she still "has swelling of scar area on right breast", upper outer quadrant.  Pain issues, if any:  no  SAFETY ISSUES:  Prior radiation? no  Pacemaker/ICD? no  Possible current pregnancy? no  Is the patient on methotrexate?  

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
## 2013-07-25 NOTE — Telephone Encounter (Signed)
per pof to sch appt-printed pt copy of sch °

## 2013-07-25 NOTE — Progress Notes (Signed)
NO CHEMO TODAY, PATIENT NEEDS BLOOD TRANSFUSION

## 2013-07-25 NOTE — Patient Instructions (Signed)
Blood Transfusion Information WHAT IS A BLOOD TRANSFUSION? A transfusion is the replacement of blood or some of its parts. Blood is made up of multiple cells which provide different functions.  Red blood cells carry oxygen and are used for blood loss replacement.  White blood cells fight against infection.  Platelets control bleeding.  Plasma helps clot blood.  Other blood products are available for specialized needs, such as hemophilia or other clotting disorders. BEFORE THE TRANSFUSION  Who gives blood for transfusions?   You may be able to donate blood to be used at a later date on yourself (autologous donation).  Relatives can be asked to donate blood. This is generally not any safer than if you have received blood from a stranger. The same precautions are taken to ensure safety when a relative's blood is donated.  Healthy volunteers who are fully evaluated to make sure their blood is safe. This is blood bank blood. Transfusion therapy is the safest it has ever been in the practice of medicine. Before blood is taken from a donor, a complete history is taken to make sure that person has no history of diseases nor engages in risky social behavior (examples are intravenous drug use or sexual activity with multiple partners). The donor's travel history is screened to minimize risk of transmitting infections, such as malaria. The donated blood is tested for signs of infectious diseases, such as HIV and hepatitis. The blood is then tested to be sure it is compatible with you in order to minimize the chance of a transfusion reaction. If you or a relative donates blood, this is often done in anticipation of surgery and is not appropriate for emergency situations. It takes many days to process the donated blood. RISKS AND COMPLICATIONS Although transfusion therapy is very safe and saves many lives, the main dangers of transfusion include:   Getting an infectious disease.  Developing a  transfusion reaction. This is an allergic reaction to something in the blood you were given. Every precaution is taken to prevent this. The decision to have a blood transfusion has been considered carefully by your caregiver before blood is given. Blood is not given unless the benefits outweigh the risks. AFTER THE TRANSFUSION  Right after receiving a blood transfusion, you will usually feel much better and more energetic. This is especially true if your red blood cells have gotten low (anemic). The transfusion raises the level of the red blood cells which carry oxygen, and this usually causes an energy increase.  The nurse administering the transfusion will monitor you carefully for complications. HOME CARE INSTRUCTIONS  No special instructions are needed after a transfusion. You may find your energy is better. Speak with your caregiver about any limitations on activity for underlying diseases you may have. SEEK MEDICAL CARE IF:   Your condition is not improving after your transfusion.  You develop redness or irritation at the intravenous (IV) site. SEEK IMMEDIATE MEDICAL CARE IF:  Any of the following symptoms occur over the next 12 hours:  Shaking chills.  You have a temperature by mouth above 102 F (38.9 C), not controlled by medicine.  Chest, back, or muscle pain.  People around you feel you are not acting correctly or are confused.  Shortness of breath or difficulty breathing.  Dizziness and fainting.  You get a rash or develop hives.  You have a decrease in urine output.  Your urine turns a dark color or changes to pink, red, or brown. Any of the following   symptoms occur over the next 10 days:  You have a temperature by mouth above 102 F (38.9 C), not controlled by medicine.  Shortness of breath.  Weakness after normal activity.  The white part of the eye turns yellow (jaundice).  You have a decrease in the amount of urine or are urinating less often.  Your  urine turns a dark color or changes to pink, red, or brown. Document Released: 03/14/2000 Document Revised: 06/09/2011 Document Reviewed: 11/01/2007 ExitCare Patient Information 2014 [employer].  

## 2013-07-26 ENCOUNTER — Ambulatory Visit: Payer: Medicare Other

## 2013-07-26 ENCOUNTER — Ambulatory Visit
Admission: RE | Admit: 2013-07-26 | Discharge: 2013-07-26 | Disposition: A | Discharge status: Home / Self Care | Payer: Medicare Other | Source: Ambulatory Visit | Attending: Radiation Oncology | Admitting: Radiation Oncology

## 2013-07-26 ENCOUNTER — Telehealth: Payer: Self-pay | Admitting: Oncology

## 2013-07-26 DIAGNOSIS — Z510 Encounter for antineoplastic radiation therapy: Secondary | ICD-10-CM | POA: Insufficient documentation

## 2013-07-26 DIAGNOSIS — C50419 Malignant neoplasm of upper-outer quadrant of unspecified female breast: Secondary | ICD-10-CM | POA: Insufficient documentation

## 2013-07-26 LAB — TYPE AND SCREEN
ABO/RH(D): O POS
Antibody Screen: NEGATIVE
Unit division: 0
Unit division: 0

## 2013-07-26 NOTE — Telephone Encounter (Signed)
pt to see Dr Valere Dross in 2wks per pof in [place] will sch w/Karen & call pt

## 2013-07-27 ENCOUNTER — Other Ambulatory Visit: Payer: Self-pay | Admitting: *Deleted

## 2013-07-27 DIAGNOSIS — C50411 Malignant neoplasm of upper-outer quadrant of right female breast: Secondary | ICD-10-CM

## 2013-07-27 MED ORDER — FLUCONAZOLE 100 MG PO TABS: 100.0000 mg | ORAL_TABLET | Freq: Every day | ORAL | Status: DC

## 2013-07-28 ENCOUNTER — Other Ambulatory Visit: Payer: Self-pay | Admitting: *Deleted

## 2013-07-28 ENCOUNTER — Telehealth: Payer: Self-pay | Admitting: Oncology

## 2013-07-28 NOTE — Telephone Encounter (Signed)
, °

## 2013-07-28 NOTE — Progress Notes (Signed)
This RN spoke with pt per her call stating she is receiving messages stating she is scheduled for IV infusions. " but I thought I was done with chemo per my visit with Dr Jana Hakim and the next step is to do radiation"  This RN reviewed pt's chart and noted per dictation above is correct.  This RN will cancel appropriate appointments.  Note per phone conversation pt states she continues to feel bad " even after the blood transfusion ".  She states she is able to take the dog for a walk " but that is about it ".  She states she is fatigued and " feel woozy headed ".  Charise states she is drinking well - but continues to have no appetite and she is continuing to lose weight with recent weight at 135 lbs.  This RN discussed with Pamala Hurry - post chemo recovery including above symptoms and may continue for several weeks. Goal at present is to pace herself, hydrate well and allow her body to recover. Discussed also appetite and how taste aversion affects eating. She verbalized understanding to eat small amounts of high calorie food during this time as well as the benefit of good fluid intake.  Llewellyn will call if other concerns arise.

## 2013-07-29 ENCOUNTER — Other Ambulatory Visit: Payer: Self-pay | Admitting: *Deleted

## 2013-07-30 ENCOUNTER — Other Ambulatory Visit: Payer: Self-pay | Admitting: Oncology

## 2013-08-01 ENCOUNTER — Ambulatory Visit: Payer: Medicare Other

## 2013-08-01 ENCOUNTER — Telehealth: Payer: Self-pay | Admitting: Oncology

## 2013-08-01 ENCOUNTER — Other Ambulatory Visit: Payer: Medicare Other

## 2013-08-01 NOTE — Telephone Encounter (Signed)
, °

## 2013-08-02 ENCOUNTER — Telehealth: Payer: Self-pay | Admitting: *Deleted

## 2013-08-02 ENCOUNTER — Ambulatory Visit: Payer: Medicare Other

## 2013-08-02 NOTE — Telephone Encounter (Signed)
Returned call from pt who has questions re: her upcoming appointments. Discussed what she can expect on 08/09/13 for nurse evaluation and appointment w/Dr Valere Dross. Also informed her of what to expect next if she has radiation treatment, re: ct simulation appointment. Pt also stated she "still has some swelling and fluid" from her surgery. Informed her that Dr Valere Dross will examine her during her consultation and determine if she can begin radiation treatment or needs more time to heal.  Pt stated all her questions were answered. Verified her appointment times on 08/09/13.

## 2013-08-08 ENCOUNTER — Other Ambulatory Visit: Payer: Medicare Other

## 2013-08-08 ENCOUNTER — Ambulatory Visit: Payer: Medicare Other

## 2013-08-09 ENCOUNTER — Ambulatory Visit
Admission: RE | Admit: 2013-08-09 | Discharge: 2013-08-09 | Disposition: A | Discharge status: Home / Self Care | Payer: Medicare Other | Source: Ambulatory Visit | Attending: Radiation Oncology | Admitting: Radiation Oncology

## 2013-08-09 ENCOUNTER — Encounter: Payer: Self-pay | Admitting: Radiation Oncology

## 2013-08-09 VITALS — BP 140/65 | HR 78 | Temp 98.5°F | Resp 20 | Wt 141.9 lb

## 2013-08-09 DIAGNOSIS — C50419 Malignant neoplasm of upper-outer quadrant of unspecified female breast: Secondary | ICD-10-CM

## 2013-08-09 DIAGNOSIS — Z510 Encounter for antineoplastic radiation therapy: Secondary | ICD-10-CM | POA: Diagnosis not present

## 2013-08-09 DIAGNOSIS — C50411 Malignant neoplasm of upper-outer quadrant of right female breast: Secondary | ICD-10-CM

## 2013-08-09 NOTE — Addendum Note (Signed)
Encounter addended by: Andria Rhein, RN on: 08/09/2013  2:20 PM<BR>     Documentation filed: Charges VN

## 2013-08-09 NOTE — Progress Notes (Signed)
CC: Dr. Autumn Messing III, Dr. Maurice Small  Diagnosis: Pathologic stage II A (T0 N1 M0) invasive ductal carcinoma of the right breast  History: Shannon Grant is a pleasant 69 year old female who is seen today for review and scheduling of her right breast radiation in the management of her T0 N1 invasive ductal carcinoma. I first saw the patient at the breast multidisciplinary clinic on 02/02/2013. A screening mammogram at [facility] on 10/08/2011 suggested a potential abnormality with a subcentimeter mass in the upper-outer quadrant of the right breast. Additional views and ultrasound on 10/16/2011 showed a cyst at 11:00. Mammography on 01/20/2013 showed a 1.5 cm high-density mass in the right breast which on ultrasound measured 1.3 cm, and was felt to be at intermediate suspicion for malignancy. An ultrasound-guided biopsy on 01/24/2013 was diagnostic for invasive mammary carcinoma with a dense lymphocytic infiltrate representing high-grade carcinoma with the differential diagnosis including invasive mammary carcinoma with medullary features versus metastatic mammary carcinoma to an intramammary lymph node. The tumor was ER negative, PR negative with a proliferation index of 80%. HER-2/neu was also negative (triple negative). Breast MR on 01/31/2013 showed a solitary mass within the upper-outer quadrant of the right breast measuring 1.4 cm. There was no axillary adenopathy and the left breast was benign. There did appear to be 3 lesions within the liver felt to represent cysts or hemangiomas. Further evaluation was recommended. Abdominal ultrasound showed liver cysts without evidence for metastatic disease. On 03/03/2013 she underwent a right partial mastectomy and sentinel lymph node biopsy. The breast mass represented a 1.5 cm lymph node with metastatic carcinoma which was essentially triple negative. Grossly, the mass was within 0.1 cm from anterior margin, 0.5 cm to the posterior margin and 1.0 cm or more from the  remaining margins. A single sentinel lymph node was without evidence for metastatic disease. She will onto receive adjuvant chemotherapy with Dr. [name]. Because of pancytopenia and a peripheral neuropathy, her chemotherapy was discontinued on April 13 after completing 6 cycles. She initially had dose dense chemotherapy. She is without complaints today.  Physical examination: Alert and oriented. Filed Vitals:   08/09/13 1336  BP: 140/65  Pulse: 78  Temp: 98.5 F (36.9 C)  Resp: 20   Head and neck examination: She wears a wig. Nodes: Without palpable cervical, supraclavicular, or axillary lymphadenopathy. Chest: Lungs clear. Breasts: There is a cystic mass along her tumor bed within the upper-outer quadrant of the right breast at 11:00. I suspect that this represents an organizing seroma. Left breast without masses or lesions. Abdomen without hepatomegaly. Extremities: Without edema.  Laboratory data: Lab Results  Component Value Date   WBC 11.4* 07/25/2013   HGB 8.5* 07/25/2013   HCT 25.6* 07/25/2013   MCV 95.2 07/25/2013   PLT 294 07/25/2013    Impression: Pathologic stage IIA (T0 N1 M0) metastatic invasive ductal carcinoma to an intramammary lymph node, no primary site identified. She is a candidate for breast preservation. With a history of chemotherapy, and having a poorly differentiated tumor I do recommend standard fractionation. I also recommend a boost in view of her 1 mm anterior surgical margin. I do not plan on treating her regional lymph nodes, but we'll treat her with high tangents to cover the low to mid axilla. We discussed the potential acute and late toxicities of radiation therapy which should be well tolerated. Consent is signed today. Will have her return for simulation/treatment planning later this week or early next week.  Plan: As discussed above.  30 minutes was spent face-to-face with the patient, primarily counseling the patient and coordinating her care.

## 2013-08-09 NOTE — Addendum Note (Signed)
Encounter addended by: Andria Rhein, RN on: 08/09/2013  1:54 PM<BR>     Documentation filed: Inpatient Document Flowsheet, Flowsheet VN

## 2013-08-09 NOTE — Progress Notes (Signed)
Please see the Nurse Progress Note in the MD Initial Consult Encounter for this patient. 

## 2013-08-09 NOTE — Addendum Note (Signed)
Encounter addended by: Rexene Edison, MD on: 08/09/2013  4:27 PM<BR>     Documentation filed: Follow-up Section, LOS Section, Clinical Notes, Notes Section, Problem List, Visit Diagnoses

## 2013-08-10 NOTE — Addendum Note (Signed)
Encounter addended by: Deirdre Evener, RN on: 08/10/2013  7:00 PM<BR>     Documentation filed: Charges VN

## 2013-08-11 ENCOUNTER — Ambulatory Visit
Admission: RE | Admit: 2013-08-11 | Discharge: 2013-08-11 | Disposition: A | Discharge status: Home / Self Care | Payer: Medicare Other | Source: Ambulatory Visit | Attending: Radiation Oncology | Admitting: Radiation Oncology

## 2013-08-11 DIAGNOSIS — Z510 Encounter for antineoplastic radiation therapy: Secondary | ICD-10-CM | POA: Diagnosis not present

## 2013-08-11 DIAGNOSIS — C50419 Malignant neoplasm of upper-outer quadrant of unspecified female breast: Secondary | ICD-10-CM | POA: Diagnosis not present

## 2013-08-11 DIAGNOSIS — C50411 Malignant neoplasm of upper-outer quadrant of right female breast: Secondary | ICD-10-CM

## 2013-08-11 NOTE — Progress Notes (Signed)
Complex simulation/treatment planning note: The patient was taken to the CT simulator. She was placed supine. A custom vac lock immobilization device was placed on a custom breast board. Her right breast field borders were marked with radiopaque wires along with her partial mastectomy scar. She was then scanned. I picked an isocenter along the central breast. The CT data set was sent to the planning system for contouring of her tumor bed, heart, and lungs. She is to be set up to medial and lateral right breast tangents. I'm using high tangents to cover her axilla. I prescribing 5000 cGy 25 sessions followed by a reduced field boost of 1000 cGy in 5 sessions. She is now ready for 3-D simulation.

## 2013-08-15 ENCOUNTER — Other Ambulatory Visit: Payer: Medicare Other

## 2013-08-15 ENCOUNTER — Ambulatory Visit: Payer: Medicare Other | Admitting: Oncology

## 2013-08-15 ENCOUNTER — Ambulatory Visit: Payer: Medicare Other

## 2013-08-15 ENCOUNTER — Telehealth: Payer: Self-pay | Admitting: *Deleted

## 2013-08-15 NOTE — Telephone Encounter (Signed)
Called and spoke with patient about her schedule.  She had duplicate appointments one for 08/30/13 and 09/01/13.  Patient would prefer 08/30/13.  Appointments cancelled for 09/01/13.

## 2013-08-16 ENCOUNTER — Encounter: Payer: Self-pay | Admitting: Radiation Oncology

## 2013-08-16 DIAGNOSIS — Z510 Encounter for antineoplastic radiation therapy: Secondary | ICD-10-CM | POA: Diagnosis not present

## 2013-08-16 DIAGNOSIS — C50419 Malignant neoplasm of upper-outer quadrant of unspecified female breast: Secondary | ICD-10-CM | POA: Diagnosis not present

## 2013-08-16 NOTE — Progress Notes (Signed)
3-D simulation note: The patient completed her 3-D simulation for treatment to her right breast. She is setup to medial and lateral right breast tangents. 3 unique multileaf collimators (field and field) or utilized for each tangent for a total of 6 complex treatment devices. I prescribing the dose to the 100% isodose curve. She is treated with 6 and 10 MV photons. Dose volume histograms were obtained for the lungs and heart in addition to the target structures. We met our departmental guidelines. I requesting daily AlignRT/optical guidance.

## 2013-08-17 DIAGNOSIS — C50419 Malignant neoplasm of upper-outer quadrant of unspecified female breast: Secondary | ICD-10-CM | POA: Diagnosis not present

## 2013-08-17 DIAGNOSIS — Z510 Encounter for antineoplastic radiation therapy: Secondary | ICD-10-CM | POA: Diagnosis not present

## 2013-08-18 ENCOUNTER — Ambulatory Visit
Admission: RE | Admit: 2013-08-18 | Discharge: 2013-08-18 | Disposition: A | Discharge status: Home / Self Care | Payer: Medicare Other | Source: Ambulatory Visit | Attending: Radiation Oncology | Admitting: Radiation Oncology

## 2013-08-18 DIAGNOSIS — Z510 Encounter for antineoplastic radiation therapy: Secondary | ICD-10-CM | POA: Diagnosis not present

## 2013-08-18 DIAGNOSIS — C50419 Malignant neoplasm of upper-outer quadrant of unspecified female breast: Secondary | ICD-10-CM | POA: Diagnosis not present

## 2013-08-18 DIAGNOSIS — C50411 Malignant neoplasm of upper-outer quadrant of right female breast: Secondary | ICD-10-CM

## 2013-08-18 NOTE — Progress Notes (Signed)
Simulation verification note: The patient underwent simulation verification for treatment to her right breast. Her isocenter is in good position and the multileaf collimators contoured the treatment volume appropriately.

## 2013-08-23 ENCOUNTER — Ambulatory Visit: Payer: Medicare Other

## 2013-08-23 ENCOUNTER — Ambulatory Visit
Admission: RE | Admit: 2013-08-23 | Discharge: 2013-08-23 | Disposition: A | Discharge status: Home / Self Care | Payer: Medicare Other | Source: Ambulatory Visit | Attending: Radiation Oncology | Admitting: Radiation Oncology

## 2013-08-23 ENCOUNTER — Encounter: Payer: Self-pay | Admitting: Radiation Oncology

## 2013-08-23 ENCOUNTER — Ambulatory Visit: Payer: Medicare Other | Admitting: Physician Assistant

## 2013-08-23 VITALS — BP 153/84 | HR 70 | Temp 98.1°F | Resp 20 | Wt 143.3 lb

## 2013-08-23 DIAGNOSIS — C50419 Malignant neoplasm of upper-outer quadrant of unspecified female breast: Secondary | ICD-10-CM | POA: Diagnosis not present

## 2013-08-23 DIAGNOSIS — Z510 Encounter for antineoplastic radiation therapy: Secondary | ICD-10-CM | POA: Diagnosis not present

## 2013-08-23 DIAGNOSIS — C50411 Malignant neoplasm of upper-outer quadrant of right female breast: Secondary | ICD-10-CM

## 2013-08-23 MED ORDER — RADIAPLEXRX EX GEL
Freq: Once | CUTANEOUS | Status: AC
  Administered 2013-08-23: 10:00:00 via TOPICAL

## 2013-08-23 MED ORDER — ALRA NON-METALLIC DEODORANT (RAD-ONC)
1.0000 "application " | Freq: Once | TOPICAL | Status: AC
  Administered 2013-08-23: 1 via TOPICAL

## 2013-08-23 NOTE — Progress Notes (Signed)
Weekly rad txs right breast 1st completed, no skin changes, no pain, gave rad book, alra deod, and radiaplex gel, briefly went over side effects, will do more teaching tomorrow, pt gave verbal understanding 9:44 AM

## 2013-08-23 NOTE — Progress Notes (Signed)
Weekly Management Note:  Site: Right breast Current Dose:  200  cGy Projected Dose: 5000  CGy followed by 5 fraction boost  Narrative: The patient is seen today for routine under treatment assessment. CBCT/MVCT images/port films were reviewed. The chart was reviewed.   She underwent patient education earlier today.  Physical Examination:  Filed Vitals:   08/23/13 0941  BP: 153/84  Pulse: 70  Temp: 98.1 F (36.7 C)  Resp: 20  .  Weight: 143 lb 4.8 oz (65 kg). No significant skin changes.  Impression: Tolerating radiation therapy well.  Plan: Continue radiation therapy as planned.

## 2013-08-24 ENCOUNTER — Other Ambulatory Visit: Payer: Medicare Other

## 2013-08-24 ENCOUNTER — Ambulatory Visit
Admission: RE | Admit: 2013-08-24 | Discharge: 2013-08-24 | Disposition: A | Discharge status: Home / Self Care | Payer: Medicare Other | Source: Ambulatory Visit | Attending: Radiation Oncology | Admitting: Radiation Oncology

## 2013-08-24 ENCOUNTER — Ambulatory Visit: Payer: Medicare Other

## 2013-08-24 DIAGNOSIS — C50419 Malignant neoplasm of upper-outer quadrant of unspecified female breast: Secondary | ICD-10-CM | POA: Diagnosis not present

## 2013-08-24 DIAGNOSIS — Z510 Encounter for antineoplastic radiation therapy: Secondary | ICD-10-CM | POA: Diagnosis not present

## 2013-08-24 NOTE — Progress Notes (Signed)
Patient informed the therapist that she did not need any further education.  Gaspar Garbe reviewed side-effect management on 08/23/13

## 2013-08-25 ENCOUNTER — Ambulatory Visit
Admission: RE | Admit: 2013-08-25 | Discharge: 2013-08-25 | Disposition: A | Discharge status: Home / Self Care | Payer: Medicare Other | Source: Ambulatory Visit | Attending: Radiation Oncology | Admitting: Radiation Oncology

## 2013-08-25 DIAGNOSIS — C50419 Malignant neoplasm of upper-outer quadrant of unspecified female breast: Secondary | ICD-10-CM | POA: Diagnosis not present

## 2013-08-25 DIAGNOSIS — Z510 Encounter for antineoplastic radiation therapy: Secondary | ICD-10-CM | POA: Diagnosis not present

## 2013-08-26 ENCOUNTER — Ambulatory Visit
Admission: RE | Admit: 2013-08-26 | Discharge: 2013-08-26 | Disposition: A | Discharge status: Home / Self Care | Payer: Medicare Other | Source: Ambulatory Visit | Attending: Radiation Oncology | Admitting: Radiation Oncology

## 2013-08-26 DIAGNOSIS — C50419 Malignant neoplasm of upper-outer quadrant of unspecified female breast: Secondary | ICD-10-CM | POA: Diagnosis not present

## 2013-08-26 DIAGNOSIS — Z510 Encounter for antineoplastic radiation therapy: Secondary | ICD-10-CM | POA: Diagnosis not present

## 2013-08-29 ENCOUNTER — Ambulatory Visit
Admission: RE | Admit: 2013-08-29 | Discharge: 2013-08-29 | Disposition: A | Discharge status: Home / Self Care | Payer: Medicare Other | Source: Ambulatory Visit | Attending: Radiation Oncology | Admitting: Radiation Oncology

## 2013-08-29 ENCOUNTER — Encounter: Payer: Self-pay | Admitting: Radiation Oncology

## 2013-08-29 VITALS — BP 147/67 | HR 71 | Temp 98.4°F | Resp 20 | Wt 144.2 lb

## 2013-08-29 DIAGNOSIS — C50419 Malignant neoplasm of upper-outer quadrant of unspecified female breast: Secondary | ICD-10-CM | POA: Diagnosis not present

## 2013-08-29 DIAGNOSIS — Z510 Encounter for antineoplastic radiation therapy: Secondary | ICD-10-CM | POA: Diagnosis not present

## 2013-08-29 DIAGNOSIS — C50411 Malignant neoplasm of upper-outer quadrant of right female breast: Secondary | ICD-10-CM

## 2013-08-29 NOTE — Progress Notes (Signed)
Pt denies pain, fatigue, loss of appetite. She does states her right breast "feels hard". She is concerned about fluid collection in the breast where the lump was removed.

## 2013-08-29 NOTE — Progress Notes (Signed)
Weekly Management Note:  Site: Right breast Current Dose:  1000  cGy Projected Dose: 5000  cGy followed by 5 fraction boost  Narrative: The patient is seen today for routine under treatment assessment. CBCT/MVCT images/port films were reviewed. The chart was reviewed.   She is without complaints today but does notice firmness of her shrinking right breast seroma.  Physical Examination:  Filed Vitals:   08/29/13 0942  BP: 147/67  Pulse: 71  Temp: 98.4 F (36.9 C)  Resp: 20  .  Weight: 144 lb 3.2 oz (65.409 kg). There are minimal skin changes. There is a firmer seroma along the upper-outer quadrant of the right breast/tumor bed. At the time of planning this measured almost 5 cm. I believe that it now measures approximately 4 cm.  Impression: Tolerating radiation therapy well. She has an organizing seroma. I reassured her that this would resolve but take many months.  Plan: Continue radiation therapy as planned.

## 2013-08-30 ENCOUNTER — Other Ambulatory Visit (HOSPITAL_BASED_OUTPATIENT_CLINIC_OR_DEPARTMENT_OTHER): Payer: Medicare Other

## 2013-08-30 ENCOUNTER — Ambulatory Visit
Admission: RE | Admit: 2013-08-30 | Discharge: 2013-08-30 | Disposition: A | Discharge status: Home / Self Care | Payer: Medicare Other | Source: Ambulatory Visit | Attending: Radiation Oncology | Admitting: Radiation Oncology

## 2013-08-30 ENCOUNTER — Ambulatory Visit (HOSPITAL_BASED_OUTPATIENT_CLINIC_OR_DEPARTMENT_OTHER): Payer: Medicare Other | Admitting: Oncology

## 2013-08-30 VITALS — BP 171/77 | HR 69 | Temp 98.5°F | Resp 18 | Ht 64.0 in | Wt 142.9 lb

## 2013-08-30 DIAGNOSIS — C50419 Malignant neoplasm of upper-outer quadrant of unspecified female breast: Secondary | ICD-10-CM | POA: Diagnosis not present

## 2013-08-30 DIAGNOSIS — D6481 Anemia due to antineoplastic chemotherapy: Secondary | ICD-10-CM

## 2013-08-30 DIAGNOSIS — G629 Polyneuropathy, unspecified: Secondary | ICD-10-CM

## 2013-08-30 DIAGNOSIS — T451X5A Adverse effect of antineoplastic and immunosuppressive drugs, initial encounter: Principal | ICD-10-CM

## 2013-08-30 DIAGNOSIS — C50411 Malignant neoplasm of upper-outer quadrant of right female breast: Secondary | ICD-10-CM

## 2013-08-30 DIAGNOSIS — Z171 Estrogen receptor negative status [ER-]: Secondary | ICD-10-CM

## 2013-08-30 DIAGNOSIS — Z510 Encounter for antineoplastic radiation therapy: Secondary | ICD-10-CM | POA: Diagnosis not present

## 2013-08-30 LAB — CBC WITH DIFFERENTIAL/PLATELET
BASO%: 0.8 % (ref 0.0–2.0)
Basophils Absolute: 0.1 10*3/uL (ref 0.0–0.1)
EOS%: 5.1 % (ref 0.0–7.0)
Eosinophils Absolute: 0.3 10*3/uL (ref 0.0–0.5)
HEMATOCRIT: 37 % (ref 34.8–46.6)
HGB: 12 g/dL (ref 11.6–15.9)
LYMPH%: 10.2 % — AB (ref 14.0–49.7)
MCH: 30.3 pg (ref 25.1–34.0)
MCHC: 32.4 g/dL (ref 31.5–36.0)
MCV: 93.4 fL (ref 79.5–101.0)
MONO#: 0.8 10*3/uL (ref 0.1–0.9)
MONO%: 12.5 % (ref 0.0–14.0)
NEUT#: 4.5 10*3/uL (ref 1.5–6.5)
NEUT%: 71.4 % (ref 38.4–76.8)
PLATELETS: 180 10*3/uL (ref 145–400)
RBC: 3.96 10*6/uL (ref 3.70–5.45)
RDW: 16.1 % — ABNORMAL HIGH (ref 11.2–14.5)
WBC: 6.3 10*3/uL (ref 3.9–10.3)
lymph#: 0.6 10*3/uL — ABNORMAL LOW (ref 0.9–3.3)

## 2013-08-30 LAB — COMPREHENSIVE METABOLIC PANEL (CC13)
ALT: 13 U/L (ref 0–55)
ANION GAP: 13 meq/L — AB (ref 3–11)
AST: 19 U/L (ref 5–34)
Albumin: 3.6 g/dL (ref 3.5–5.0)
Alkaline Phosphatase: 54 U/L (ref 40–150)
BUN: 16.1 mg/dL (ref 7.0–26.0)
CO2: 23 meq/L (ref 22–29)
CREATININE: 0.7 mg/dL (ref 0.6–1.1)
Calcium: 9.6 mg/dL (ref 8.4–10.4)
Chloride: 107 mEq/L (ref 98–109)
Glucose: 101 mg/dl (ref 70–140)
Potassium: 3.9 mEq/L (ref 3.5–5.1)
Sodium: 143 mEq/L (ref 136–145)
Total Bilirubin: 0.35 mg/dL (ref 0.20–1.20)
Total Protein: 6 g/dL — ABNORMAL LOW (ref 6.4–8.3)
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
## 2013-08-31 ENCOUNTER — Ambulatory Visit
Admission: RE | Admit: 2013-08-31 | Discharge: 2013-08-31 | Disposition: A | Discharge status: Home / Self Care | Payer: Medicare Other | Source: Ambulatory Visit | Attending: Radiation Oncology | Admitting: Radiation Oncology

## 2013-08-31 DIAGNOSIS — C50419 Malignant neoplasm of upper-outer quadrant of unspecified female breast: Secondary | ICD-10-CM | POA: Diagnosis not present

## 2013-08-31 DIAGNOSIS — Z510 Encounter for antineoplastic radiation therapy: Secondary | ICD-10-CM | POA: Diagnosis not present

## 2013-09-01 ENCOUNTER — Other Ambulatory Visit: Payer: Medicare Other

## 2013-09-01 ENCOUNTER — Ambulatory Visit: Payer: Medicare Other | Admitting: Oncology

## 2013-09-01 ENCOUNTER — Telehealth: Payer: Self-pay | Admitting: Oncology

## 2013-09-01 ENCOUNTER — Ambulatory Visit
Admission: RE | Admit: 2013-09-01 | Discharge: 2013-09-01 | Disposition: A | Discharge status: Home / Self Care | Payer: Medicare Other | Source: Ambulatory Visit | Attending: Radiation Oncology | Admitting: Radiation Oncology

## 2013-09-01 DIAGNOSIS — Z510 Encounter for antineoplastic radiation therapy: Secondary | ICD-10-CM | POA: Diagnosis not present

## 2013-09-01 DIAGNOSIS — C50419 Malignant neoplasm of upper-outer quadrant of unspecified female breast: Secondary | ICD-10-CM | POA: Diagnosis not present

## 2013-09-01 NOTE — Telephone Encounter (Signed)
per pof to sch appt-cld CSS to sch port remo w/Dr Toth-sch 7/28-cld pt to adv of appt times & date-adv pt to stop by sch to get updated sch

## 2013-09-02 ENCOUNTER — Ambulatory Visit
Admission: RE | Admit: 2013-09-02 | Discharge: 2013-09-02 | Disposition: A | Discharge status: Home / Self Care | Payer: Medicare Other | Source: Ambulatory Visit | Attending: Radiation Oncology | Admitting: Radiation Oncology

## 2013-09-02 DIAGNOSIS — Z510 Encounter for antineoplastic radiation therapy: Secondary | ICD-10-CM | POA: Diagnosis not present

## 2013-09-02 DIAGNOSIS — C50419 Malignant neoplasm of upper-outer quadrant of unspecified female breast: Secondary | ICD-10-CM | POA: Diagnosis not present

## 2013-09-05 ENCOUNTER — Ambulatory Visit
Admission: RE | Admit: 2013-09-05 | Discharge: 2013-09-05 | Disposition: A | Discharge status: Home / Self Care | Payer: Medicare Other | Source: Ambulatory Visit | Attending: Radiation Oncology | Admitting: Radiation Oncology

## 2013-09-05 VITALS — BP 138/77 | HR 67 | Temp 98.5°F | Resp 20 | Wt 141.9 lb

## 2013-09-05 DIAGNOSIS — C50419 Malignant neoplasm of upper-outer quadrant of unspecified female breast: Secondary | ICD-10-CM | POA: Diagnosis not present

## 2013-09-05 DIAGNOSIS — C50411 Malignant neoplasm of upper-outer quadrant of right female breast: Secondary | ICD-10-CM

## 2013-09-05 DIAGNOSIS — Z510 Encounter for antineoplastic radiation therapy: Secondary | ICD-10-CM | POA: Diagnosis not present

## 2013-09-05 NOTE — Progress Notes (Signed)
   Weekly Management Note:  outpatient Current Dose:  20 Gy  Projected Dose: 50 Gy initial  Narrative:  The patient presents for routine under treatment assessment.  CBCT/MVCT images/Port film x-rays were reviewed.  The chart was checked. She is doing well.  She continues to have a right breast seroma.    Physical Findings:  weight is 141 lb 14.4 oz (64.365 kg). Her oral temperature is 98.5 F (36.9 C). Her blood pressure is 138/77 and her pulse is 67. Her respiration is 20.  mild hyperpigmentation of right breast, palpable seroma at lumpectomy site.No sign of infection  Impression:  The patient is tolerating radiotherapy.  Plan:  Continue radiotherapy as planned.   ________________________________   [name] Gibson, M.D.

## 2013-09-05 NOTE — Progress Notes (Signed)
Pt denies pain, loss of appetite. She is fatigued but [place] to [place] for the weekend. She has not been applying Radiaplex to right breast; advised she begin. Pt verbalized proper timing for this.

## 2013-09-06 ENCOUNTER — Ambulatory Visit
Admission: RE | Admit: 2013-09-06 | Discharge: 2013-09-06 | Disposition: A | Discharge status: Home / Self Care | Payer: Medicare Other | Source: Ambulatory Visit | Attending: Radiation Oncology | Admitting: Radiation Oncology

## 2013-09-06 DIAGNOSIS — C50419 Malignant neoplasm of upper-outer quadrant of unspecified female breast: Secondary | ICD-10-CM | POA: Diagnosis not present

## 2013-09-06 DIAGNOSIS — Z510 Encounter for antineoplastic radiation therapy: Secondary | ICD-10-CM | POA: Diagnosis not present

## 2013-09-07 ENCOUNTER — Ambulatory Visit
Admission: RE | Admit: 2013-09-07 | Discharge: 2013-09-07 | Disposition: A | Discharge status: Home / Self Care | Payer: Medicare Other | Source: Ambulatory Visit | Attending: Radiation Oncology | Admitting: Radiation Oncology

## 2013-09-07 DIAGNOSIS — C50419 Malignant neoplasm of upper-outer quadrant of unspecified female breast: Secondary | ICD-10-CM | POA: Diagnosis not present

## 2013-09-07 DIAGNOSIS — Z510 Encounter for antineoplastic radiation therapy: Secondary | ICD-10-CM | POA: Diagnosis not present

## 2013-09-08 ENCOUNTER — Ambulatory Visit
Admission: RE | Admit: 2013-09-08 | Discharge: 2013-09-08 | Disposition: A | Discharge status: Home / Self Care | Payer: Medicare Other | Source: Ambulatory Visit | Attending: Radiation Oncology | Admitting: Radiation Oncology

## 2013-09-08 DIAGNOSIS — Z510 Encounter for antineoplastic radiation therapy: Secondary | ICD-10-CM | POA: Diagnosis not present

## 2013-09-08 DIAGNOSIS — C50419 Malignant neoplasm of upper-outer quadrant of unspecified female breast: Secondary | ICD-10-CM | POA: Diagnosis not present

## 2013-09-09 ENCOUNTER — Ambulatory Visit
Admission: RE | Admit: 2013-09-09 | Discharge: 2013-09-09 | Disposition: A | Discharge status: Home / Self Care | Payer: Medicare Other | Source: Ambulatory Visit | Attending: Radiation Oncology | Admitting: Radiation Oncology

## 2013-09-09 DIAGNOSIS — C50419 Malignant neoplasm of upper-outer quadrant of unspecified female breast: Secondary | ICD-10-CM | POA: Diagnosis not present

## 2013-09-09 DIAGNOSIS — Z510 Encounter for antineoplastic radiation therapy: Secondary | ICD-10-CM | POA: Diagnosis not present

## 2013-09-12 ENCOUNTER — Encounter: Payer: Self-pay | Admitting: Radiation Oncology

## 2013-09-12 ENCOUNTER — Ambulatory Visit
Admission: RE | Admit: 2013-09-12 | Discharge: 2013-09-12 | Disposition: A | Discharge status: Home / Self Care | Payer: Medicare Other | Source: Ambulatory Visit | Attending: Radiation Oncology | Admitting: Radiation Oncology

## 2013-09-12 VITALS — BP 141/77 | HR 68 | Temp 98.2°F | Resp 20 | Wt 142.0 lb

## 2013-09-12 DIAGNOSIS — C50419 Malignant neoplasm of upper-outer quadrant of unspecified female breast: Secondary | ICD-10-CM | POA: Diagnosis not present

## 2013-09-12 DIAGNOSIS — C50411 Malignant neoplasm of upper-outer quadrant of right female breast: Secondary | ICD-10-CM

## 2013-09-12 DIAGNOSIS — Z510 Encounter for antineoplastic radiation therapy: Secondary | ICD-10-CM | POA: Diagnosis not present

## 2013-09-12 NOTE — Progress Notes (Signed)
Weekly Management Note:  Site: Right breast Current Dose:  3000  cGy Projected Dose: 5000  cGy followed by 5 fraction boost  Narrative: The patient is seen today for routine under treatment assessment. CBCT/MVCT images/port films were reviewed. The chart was reviewed.   She reports mild fatigue. She uses Radioplex gel. No new complaints today.  Physical Examination:  Filed Vitals:   09/12/13 0926  BP: 141/77  Pulse: 68  Temp: 98.2 F (36.8 C)  Resp: 20  .  Weight: 142 lb (64.411 kg). There is mild erythema the skin along the right breast with no areas of desquamation.  Impression: Tolerating radiation therapy well.  Plan: Continue radiation therapy as planned.

## 2013-09-12 NOTE — Progress Notes (Signed)
Pt denies pain, loss of appetite. She is fatigued, but states "it is nothing like chemo". She is applying Radiaplex to right breast treatment area for hyperpigmentation.

## 2013-09-13 ENCOUNTER — Ambulatory Visit
Admission: RE | Admit: 2013-09-13 | Discharge: 2013-09-13 | Disposition: A | Discharge status: Home / Self Care | Payer: Medicare Other | Source: Ambulatory Visit | Attending: Radiation Oncology | Admitting: Radiation Oncology

## 2013-09-13 DIAGNOSIS — Z510 Encounter for antineoplastic radiation therapy: Secondary | ICD-10-CM | POA: Diagnosis not present

## 2013-09-13 DIAGNOSIS — C50419 Malignant neoplasm of upper-outer quadrant of unspecified female breast: Secondary | ICD-10-CM | POA: Diagnosis not present

## 2013-09-14 ENCOUNTER — Ambulatory Visit
Admission: RE | Admit: 2013-09-14 | Discharge: 2013-09-14 | Disposition: A | Discharge status: Home / Self Care | Payer: Medicare Other | Source: Ambulatory Visit | Attending: Radiation Oncology | Admitting: Radiation Oncology

## 2013-09-14 DIAGNOSIS — Z510 Encounter for antineoplastic radiation therapy: Secondary | ICD-10-CM | POA: Diagnosis not present

## 2013-09-14 DIAGNOSIS — C50419 Malignant neoplasm of upper-outer quadrant of unspecified female breast: Secondary | ICD-10-CM | POA: Diagnosis not present

## 2013-09-15 ENCOUNTER — Ambulatory Visit
Admission: RE | Admit: 2013-09-15 | Discharge: 2013-09-15 | Disposition: A | Discharge status: Home / Self Care | Payer: Medicare Other | Source: Ambulatory Visit | Attending: Radiation Oncology | Admitting: Radiation Oncology

## 2013-09-15 ENCOUNTER — Encounter: Payer: Self-pay | Admitting: Radiation Oncology

## 2013-09-15 DIAGNOSIS — Z510 Encounter for antineoplastic radiation therapy: Secondary | ICD-10-CM | POA: Diagnosis not present

## 2013-09-15 DIAGNOSIS — C50419 Malignant neoplasm of upper-outer quadrant of unspecified female breast: Secondary | ICD-10-CM | POA: Diagnosis not present

## 2013-09-15 NOTE — Progress Notes (Signed)
Complex electron beam simulation note: The patient underwent virtual simulation for her right breast boost. She was set up en face. One custom block is constructed to conform the field. She underwent a Monte Carlo calculation to deliver 1000 cGy 5 sessions with 18 MEV electrons. At least 100% of the lumpectomy cavity receives 90% of the prescribed dose.

## 2013-09-16 ENCOUNTER — Ambulatory Visit
Admission: RE | Admit: 2013-09-16 | Discharge: 2013-09-16 | Disposition: A | Discharge status: Home / Self Care | Payer: Medicare Other | Source: Ambulatory Visit | Attending: Radiation Oncology | Admitting: Radiation Oncology

## 2013-09-16 DIAGNOSIS — Z510 Encounter for antineoplastic radiation therapy: Secondary | ICD-10-CM | POA: Diagnosis not present

## 2013-09-16 DIAGNOSIS — C50419 Malignant neoplasm of upper-outer quadrant of unspecified female breast: Secondary | ICD-10-CM | POA: Diagnosis not present

## 2013-09-19 ENCOUNTER — Ambulatory Visit
Admission: RE | Admit: 2013-09-19 | Discharge: 2013-09-19 | Disposition: A | Discharge status: Home / Self Care | Payer: Medicare Other | Source: Ambulatory Visit | Attending: Radiation Oncology | Admitting: Radiation Oncology

## 2013-09-19 ENCOUNTER — Encounter: Payer: Self-pay | Admitting: Radiation Oncology

## 2013-09-19 VITALS — BP 152/83 | HR 61 | Temp 97.9°F | Resp 20 | Wt 144.2 lb

## 2013-09-19 DIAGNOSIS — Z510 Encounter for antineoplastic radiation therapy: Secondary | ICD-10-CM | POA: Diagnosis not present

## 2013-09-19 DIAGNOSIS — C50419 Malignant neoplasm of upper-outer quadrant of unspecified female breast: Secondary | ICD-10-CM | POA: Diagnosis not present

## 2013-09-19 DIAGNOSIS — C50411 Malignant neoplasm of upper-outer quadrant of right female breast: Secondary | ICD-10-CM

## 2013-09-19 NOTE — Progress Notes (Signed)
Patient denies pain but states her right breast "feels tender". She is applying Radiaplex twice daily for hyperpigmentation of skin. She denies itching. She is fatigued, no loss of appetite.

## 2013-09-19 NOTE — Progress Notes (Signed)
Weekly Management Note:  Site: Right breast Current Dose:  4000  cGy Projected Dose: 5000  cGy followed by 1 week boost  Narrative: The patient is seen today for routine under treatment assessment. CBCT/MVCT images/port films were reviewed. The chart was reviewed.   She is without complaints today. She is starting to use Radioplex gel. Electron beam setup was reviewed today.  Physical Examination:  Filed Vitals:   09/19/13 1005  BP: 152/83  Pulse: 61  Temp: 97.9 F (36.6 C)  Resp: 20  .  Weight: 144 lb 3.2 oz (65.409 kg). There is hyperpigmentation the skin along the right breast with patchy dry desquamation but no areas of moist desquamation.  Impression: Tolerating radiation therapy well.  Plan: Continue radiation therapy as planned.

## 2013-09-20 ENCOUNTER — Ambulatory Visit
Admission: RE | Admit: 2013-09-20 | Discharge: 2013-09-20 | Disposition: A | Discharge status: Home / Self Care | Payer: Medicare Other | Source: Ambulatory Visit | Attending: Radiation Oncology | Admitting: Radiation Oncology

## 2013-09-20 DIAGNOSIS — Z510 Encounter for antineoplastic radiation therapy: Secondary | ICD-10-CM | POA: Diagnosis not present

## 2013-09-20 DIAGNOSIS — C50419 Malignant neoplasm of upper-outer quadrant of unspecified female breast: Secondary | ICD-10-CM | POA: Diagnosis not present

## 2013-09-21 ENCOUNTER — Ambulatory Visit: Payer: Medicare Other

## 2013-09-22 ENCOUNTER — Ambulatory Visit
Admission: RE | Admit: 2013-09-22 | Discharge: 2013-09-22 | Disposition: A | Discharge status: Home / Self Care | Payer: Medicare Other | Source: Ambulatory Visit | Attending: Radiation Oncology | Admitting: Radiation Oncology

## 2013-09-22 DIAGNOSIS — Z510 Encounter for antineoplastic radiation therapy: Secondary | ICD-10-CM | POA: Diagnosis not present

## 2013-09-22 DIAGNOSIS — C50419 Malignant neoplasm of upper-outer quadrant of unspecified female breast: Secondary | ICD-10-CM | POA: Diagnosis not present

## 2013-09-23 ENCOUNTER — Ambulatory Visit
Admission: RE | Admit: 2013-09-23 | Discharge: 2013-09-23 | Disposition: A | Discharge status: Home / Self Care | Payer: Medicare Other | Source: Ambulatory Visit | Attending: Radiation Oncology | Admitting: Radiation Oncology

## 2013-09-23 DIAGNOSIS — C50419 Malignant neoplasm of upper-outer quadrant of unspecified female breast: Secondary | ICD-10-CM | POA: Diagnosis not present

## 2013-09-23 DIAGNOSIS — Z510 Encounter for antineoplastic radiation therapy: Secondary | ICD-10-CM | POA: Diagnosis not present

## 2013-09-26 ENCOUNTER — Ambulatory Visit
Admission: RE | Admit: 2013-09-26 | Discharge: 2013-09-26 | Disposition: A | Discharge status: Home / Self Care | Payer: Medicare Other | Source: Ambulatory Visit | Attending: Radiation Oncology | Admitting: Radiation Oncology

## 2013-09-26 VITALS — BP 147/69 | HR 68 | Temp 98.3°F | Resp 20 | Wt 142.1 lb

## 2013-09-26 DIAGNOSIS — Z510 Encounter for antineoplastic radiation therapy: Secondary | ICD-10-CM | POA: Diagnosis not present

## 2013-09-26 DIAGNOSIS — C50419 Malignant neoplasm of upper-outer quadrant of unspecified female breast: Secondary | ICD-10-CM | POA: Diagnosis not present

## 2013-09-26 DIAGNOSIS — C50411 Malignant neoplasm of upper-outer quadrant of right female breast: Secondary | ICD-10-CM

## 2013-09-26 MED ORDER — BIAFINE EX EMUL
Freq: Two times a day (BID) | CUTANEOUS | Status: DC
  Administered 2013-09-26: 10:00:00 via TOPICAL

## 2013-09-26 NOTE — Addendum Note (Signed)
Encounter addended by: Andria Rhein, RN on: 09/26/2013 10:13 AM<BR>     Documentation filed: Inpatient MAR, Orders

## 2013-09-26 NOTE — Progress Notes (Signed)
   Weekly Management Note:  outpatient Current Dose:  48 Gy  Projected Dose: 50 Gy  + boost Right breast  Narrative:  The patient presents for routine under treatment assessment.  CBCT/MVCT images/Port film x-rays were reviewed.  The chart was checked. Skin itches, right breast  Physical Findings:  weight is 142 lb 1.6 oz (64.456 kg). Her oral temperature is 98.3 F (36.8 C). Her blood pressure is 147/69 and her pulse is 68. Her respiration is 20.  right breast erythema, intact skin  Impression:  The patient is tolerating radiotherapy.  Plan:  Continue radiotherapy as planned. Biafine given, will add hydrocortisone prn  ________________________________   [name] Gibson, M.D.

## 2013-09-26 NOTE — Progress Notes (Signed)
Patient denies pain, loss of appetite. She is fatigued, reports itching of skin in treatment area of right breast. She has hyperpigmentation, some radiation dermatitis. Gave pt Biafine w/instructions for proper use. Pt verbalized understanding.

## 2013-09-27 ENCOUNTER — Ambulatory Visit
Admission: RE | Admit: 2013-09-27 | Discharge: 2013-09-27 | Disposition: A | Discharge status: Home / Self Care | Payer: Medicare Other | Source: Ambulatory Visit | Attending: Radiation Oncology | Admitting: Radiation Oncology

## 2013-09-27 DIAGNOSIS — C50419 Malignant neoplasm of upper-outer quadrant of unspecified female breast: Secondary | ICD-10-CM | POA: Diagnosis not present

## 2013-09-27 DIAGNOSIS — Z510 Encounter for antineoplastic radiation therapy: Secondary | ICD-10-CM | POA: Diagnosis not present

## 2013-09-28 ENCOUNTER — Ambulatory Visit
Admission: RE | Admit: 2013-09-28 | Discharge: 2013-09-28 | Disposition: A | Discharge status: Home / Self Care | Payer: Medicare Other | Source: Ambulatory Visit | Attending: Radiation Oncology | Admitting: Radiation Oncology

## 2013-09-28 DIAGNOSIS — C50419 Malignant neoplasm of upper-outer quadrant of unspecified female breast: Secondary | ICD-10-CM | POA: Diagnosis not present

## 2013-09-28 DIAGNOSIS — Z510 Encounter for antineoplastic radiation therapy: Secondary | ICD-10-CM | POA: Diagnosis not present

## 2013-09-29 ENCOUNTER — Ambulatory Visit
Admission: RE | Admit: 2013-09-29 | Discharge: 2013-09-29 | Disposition: A | Discharge status: Home / Self Care | Payer: Medicare Other | Source: Ambulatory Visit | Attending: Radiation Oncology | Admitting: Radiation Oncology

## 2013-09-29 DIAGNOSIS — Z510 Encounter for antineoplastic radiation therapy: Secondary | ICD-10-CM | POA: Diagnosis not present

## 2013-10-03 ENCOUNTER — Ambulatory Visit
Admission: RE | Admit: 2013-10-03 | Discharge: 2013-10-03 | Disposition: A | Discharge status: Home / Self Care | Payer: Medicare Other | Source: Ambulatory Visit | Attending: Radiation Oncology | Admitting: Radiation Oncology

## 2013-10-03 ENCOUNTER — Encounter: Payer: Self-pay | Admitting: Radiation Oncology

## 2013-10-03 VITALS — BP 148/65 | HR 67 | Temp 98.0°F | Resp 20 | Wt 142.0 lb

## 2013-10-03 DIAGNOSIS — Z510 Encounter for antineoplastic radiation therapy: Secondary | ICD-10-CM | POA: Diagnosis not present

## 2013-10-03 DIAGNOSIS — C50411 Malignant neoplasm of upper-outer quadrant of right female breast: Secondary | ICD-10-CM

## 2013-10-03 DIAGNOSIS — C50419 Malignant neoplasm of upper-outer quadrant of unspecified female breast: Secondary | ICD-10-CM | POA: Diagnosis not present

## 2013-10-03 NOTE — Progress Notes (Signed)
Weekly rad txs 28 txs completed  Erythema on breast,under inframmary fold peeling, slight itching using biafine 2-3x day, appetite getting better, taste is coming back,  Energy level poor,   9:37 AM

## 2013-10-03 NOTE — Progress Notes (Signed)
Weekly Management Note:  Site: Right breast boost Current Dose:  600  cGy Projected Dose: 1000  cGy  Narrative: The patient is seen today for routine under treatment assessment. CBCT/MVCT images/port films were reviewed. The chart was reviewed.   She is without new complaints today. She is fatigued. She uses Radioplex gel.  Physical Examination:  Filed Vitals:   10/03/13 0933  BP: 148/65  Pulse: 67  Temp: 98 F (36.7 C)  Resp: 20  .  Weight: 142 lb (64.411 kg). There is hyperpigmentation the skin along the right breast with dry desquamation along the inframammary fold.  Impression: Tolerating radiation therapy well. She will finish her radiation therapy this Wednesday.  Plan: Continue radiation therapy as planned.

## 2013-10-04 ENCOUNTER — Ambulatory Visit: Payer: Medicare Other

## 2013-10-04 ENCOUNTER — Ambulatory Visit
Admission: RE | Admit: 2013-10-04 | Discharge: 2013-10-04 | Disposition: A | Discharge status: Home / Self Care | Payer: Medicare Other | Source: Ambulatory Visit | Attending: Radiation Oncology | Admitting: Radiation Oncology

## 2013-10-04 DIAGNOSIS — C50419 Malignant neoplasm of upper-outer quadrant of unspecified female breast: Secondary | ICD-10-CM | POA: Diagnosis not present

## 2013-10-04 DIAGNOSIS — Z510 Encounter for antineoplastic radiation therapy: Secondary | ICD-10-CM | POA: Diagnosis not present

## 2013-10-05 ENCOUNTER — Encounter: Payer: Self-pay | Admitting: Radiation Oncology

## 2013-10-05 ENCOUNTER — Ambulatory Visit: Payer: Medicare Other

## 2013-10-05 ENCOUNTER — Ambulatory Visit
Admission: RE | Admit: 2013-10-05 | Discharge: 2013-10-05 | Disposition: A | Discharge status: Home / Self Care | Payer: Medicare Other | Source: Ambulatory Visit | Attending: Radiation Oncology | Admitting: Radiation Oncology

## 2013-10-05 DIAGNOSIS — Z510 Encounter for antineoplastic radiation therapy: Secondary | ICD-10-CM | POA: Diagnosis not present

## 2013-10-05 NOTE — Progress Notes (Signed)
[facility] End of Treatment Note  Name:Shannon Grant  Date: 10/05/2013 XHB:[id number] DOB:1945/05/26   Status:outpatient    CC: Maurice Small, D, MD  Dr. Autumn Messing III  REFERRING PHYSICIAN:    Dr. Autumn Messing III   DIAGNOSIS:  Pathologic stage IIA (T0 N1 M0) invasive ductal carcinoma the right breast  INDICATION FOR TREATMENT: Curative   TREATMENT DATES: 08/23/2013 through 10/05/2013                          SITE/DOSE: Right breast 5000 cGy in 25 sessions, right breast boost 1000 cGy in 5 sessions                            BEAMS/ENERGY:    Mixed 6 MV/10 MV photons, tangential fields to the right breast, 18 MEV electrons, right breast boost               NARRATIVE:   [name] tolerated treatment well with hyperpigmentation the skin and dry desquamation by completion of therapy.  She uses Radioplex gel during her course of therapy.                       PLAN: Routine followup in one month. Patient instructed to call if questions or worsening complaints in interim.

## 2013-10-13 ENCOUNTER — Other Ambulatory Visit: Payer: Self-pay

## 2013-10-13 DIAGNOSIS — C50411 Malignant neoplasm of upper-outer quadrant of right female breast: Secondary | ICD-10-CM

## 2013-10-13 MED ORDER — NITROFURANTOIN MACROCRYSTAL 100 MG PO CAPS: 100.0000 mg | ORAL_CAPSULE | Freq: Two times a day (BID) | ORAL | Status: DC

## 2013-10-13 NOTE — Telephone Encounter (Signed)
Let pt know order for macrodantin has been sent to [facility].  Receipt confirmed by pharmacy.  Pt voiced understanding.

## 2013-10-24 ENCOUNTER — Telehealth: Payer: Self-pay | Admitting: *Deleted

## 2013-10-24 NOTE — Telephone Encounter (Signed)
Pt called to this RN to inquire if it is recommended to proceed with port removal- " I am scheduled to see Dr Marlou Starks tomorrow and Dr Jana Hakim in August and should I wait ?"  This RN reviewed last MD note with MD stating " pt will have port removed post radiation therapy completed "  This RN discussed above with pt including her concerns regarding needing port and "do not want to go under anesthesia again if needed ".  This RN informed pt per her diagnosis and expected outcome need for port not indicated- discussed keeping port would be her personal decision and need for maintenance if kept.  Plan per conversation is [place] will have port removed " and plan for the best ".

## 2013-10-25 ENCOUNTER — Ambulatory Visit (INDEPENDENT_AMBULATORY_CARE_PROVIDER_SITE_OTHER): Payer: Medicare Other | Admitting: General Surgery

## 2013-10-25 ENCOUNTER — Encounter (INDEPENDENT_AMBULATORY_CARE_PROVIDER_SITE_OTHER): Payer: Self-pay | Admitting: General Surgery

## 2013-10-25 VITALS — BP 152/82 | HR 78 | Resp 18 | Ht 64.0 in | Wt 142.4 lb

## 2013-10-25 DIAGNOSIS — C50419 Malignant neoplasm of upper-outer quadrant of unspecified female breast: Secondary | ICD-10-CM

## 2013-10-25 DIAGNOSIS — C50411 Malignant neoplasm of upper-outer quadrant of right female breast: Secondary | ICD-10-CM

## 2013-10-25 NOTE — Progress Notes (Signed)
Subjective:     Patient ID: Shannon Grant, female   DOB: [birth year], 68 y.o.   MRN: [id number]  HPI The patient is a 68 year old white female who is 6 months status post right lumpectomy for a right-sided breast cancer. Her pathology showed that the area of positivity in the right breast was an intramammary lymph node. Her some of those were negative. She is finished chemoradiation therapy and tolerated it well.  Review of Systems  Constitutional: Negative.   HENT: Negative.   Eyes: Negative.   Respiratory: Negative.   Cardiovascular: Negative.   Gastrointestinal: Negative.   Endocrine: Negative.   Genitourinary: Negative.   Musculoskeletal: Negative.   Skin: Negative.   Allergic/Immunologic: Negative.   Neurological: Negative.   Hematological: Negative.   Psychiatric/Behavioral: Negative.        Objective:   Physical Exam  Constitutional: She is oriented to person, place, and time. She appears well-developed and well-nourished.  HENT:  Head: Normocephalic and atraumatic.  Eyes: Conjunctivae and EOM are normal. Pupils are equal, round, and reactive to light.  Neck: Normal range of motion. Neck supple.  Cardiovascular: Normal rate, regular rhythm and normal heart sounds.   Pulmonary/Chest: Effort normal and breath sounds normal.  There is a palpable seroma in the right breast which is stable. There is no palpable mass in the left breast. There is no palpable axillary, supraclavicular, or cervical lymphadenopathy  Abdominal: Soft. Bowel sounds are normal.  Musculoskeletal: Normal range of motion.  Lymphadenopathy:    She has no cervical adenopathy.  Neurological: She is alert and oriented to person, place, and time.  Skin: Skin is warm and dry.  Psychiatric: She has a normal mood and affect. Her behavior is normal.       Assessment:     The patient is 6 months status post right lumpectomy for breast cancer. She has finished chemoradiation therapy and would like to have her  Port-A-Cath removed. I have discussed with her in detail the risks and benefits of the operation to remove the port as well as some of the technical aspects and she understands and wishes to proceed. I also think it would be reasonable to aspirate the seroma lower in the operating room and she agrees.     Plan:     Plan for removal of Port-A-Cath and aspiration of right breast seroma

## 2013-11-02 ENCOUNTER — Ambulatory Visit (HOSPITAL_BASED_OUTPATIENT_CLINIC_OR_DEPARTMENT_OTHER): Payer: Medicare Other | Admitting: Oncology

## 2013-11-02 ENCOUNTER — Other Ambulatory Visit (HOSPITAL_BASED_OUTPATIENT_CLINIC_OR_DEPARTMENT_OTHER): Payer: Medicare Other

## 2013-11-02 ENCOUNTER — Encounter: Payer: Self-pay | Admitting: Oncology

## 2013-11-02 ENCOUNTER — Telehealth: Payer: Self-pay | Admitting: Oncology

## 2013-11-02 VITALS — BP 171/73 | HR 66 | Temp 98.9°F | Resp 18 | Ht 64.0 in | Wt 141.7 lb

## 2013-11-02 DIAGNOSIS — T451X5A Adverse effect of antineoplastic and immunosuppressive drugs, initial encounter: Principal | ICD-10-CM

## 2013-11-02 DIAGNOSIS — I10 Essential (primary) hypertension: Secondary | ICD-10-CM

## 2013-11-02 DIAGNOSIS — C50419 Malignant neoplasm of upper-outer quadrant of unspecified female breast: Secondary | ICD-10-CM

## 2013-11-02 DIAGNOSIS — Z171 Estrogen receptor negative status [ER-]: Secondary | ICD-10-CM | POA: Diagnosis not present

## 2013-11-02 DIAGNOSIS — D6481 Anemia due to antineoplastic chemotherapy: Secondary | ICD-10-CM

## 2013-11-02 DIAGNOSIS — C50411 Malignant neoplasm of upper-outer quadrant of right female breast: Secondary | ICD-10-CM

## 2013-11-02 DIAGNOSIS — R5381 Other malaise: Secondary | ICD-10-CM

## 2013-11-02 DIAGNOSIS — R5383 Other fatigue: Secondary | ICD-10-CM

## 2013-11-02 LAB — COMPREHENSIVE METABOLIC PANEL (CC13)
ALBUMIN: 4.2 g/dL (ref 3.5–5.0)
ALT: 16 U/L (ref 0–55)
AST: 21 U/L (ref 5–34)
Alkaline Phosphatase: 68 U/L (ref 40–150)
Anion Gap: 12 mEq/L — ABNORMAL HIGH (ref 3–11)
BUN: 11.5 mg/dL (ref 7.0–26.0)
CHLORIDE: 106 meq/L (ref 98–109)
CO2: 25 mEq/L (ref 22–29)
Calcium: 10.3 mg/dL (ref 8.4–10.4)
Creatinine: 0.7 mg/dL (ref 0.6–1.1)
GLUCOSE: 93 mg/dL (ref 70–140)
POTASSIUM: 3.7 meq/L (ref 3.5–5.1)
Sodium: 143 mEq/L (ref 136–145)
Total Bilirubin: 0.62 mg/dL (ref 0.20–1.20)
Total Protein: 6.8 g/dL (ref 6.4–8.3)

## 2013-11-02 LAB — CBC WITH DIFFERENTIAL/PLATELET
BASO%: 0.9 % (ref 0.0–2.0)
BASOS ABS: 0 10*3/uL (ref 0.0–0.1)
EOS ABS: 0.1 10*3/uL (ref 0.0–0.5)
EOS%: 2.3 % (ref 0.0–7.0)
HCT: 42.6 % (ref 34.8–46.6)
HEMOGLOBIN: 13.9 g/dL (ref 11.6–15.9)
LYMPH#: 0.5 10*3/uL — AB (ref 0.9–3.3)
LYMPH%: 11.6 % — ABNORMAL LOW (ref 14.0–49.7)
MCH: 30.3 pg (ref 25.1–34.0)
MCHC: 32.6 g/dL (ref 31.5–36.0)
MCV: 93 fL (ref 79.5–101.0)
MONO#: 0.5 10*3/uL (ref 0.1–0.9)
MONO%: 10.7 % (ref 0.0–14.0)
NEUT#: 3.5 10*3/uL (ref 1.5–6.5)
NEUT%: 74.5 % (ref 38.4–76.8)
Platelets: 173 10*3/uL (ref 145–400)
RBC: 4.58 10*6/uL (ref 3.70–5.45)
RDW: 15.1 % — AB (ref 11.2–14.5)
WBC: 4.7 10*3/uL (ref 3.9–10.3)
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
## 2013-11-02 NOTE — Telephone Encounter (Signed)
per pof to sch pt appt-gave pt copy of sch °

## 2013-11-03 ENCOUNTER — Telehealth (INDEPENDENT_AMBULATORY_CARE_PROVIDER_SITE_OTHER): Payer: Self-pay

## 2013-11-03 NOTE — Telephone Encounter (Signed)
Called pt back and let her know since she is scheduled under local she is fine to drive after her procedure.

## 2013-11-03 NOTE — Telephone Encounter (Signed)
Message copied by Carlene Coria on Thu Nov 03, 2013  2:35 PM ------      Message from: Joya San      Created: Thu Nov 03, 2013 11:20 AM      Contact: [phone]       Pt called asking if she should drive home after her procedure? Please call pt. ------

## 2013-11-08 ENCOUNTER — Encounter: Payer: Self-pay | Admitting: *Deleted

## 2013-11-13 NOTE — Addendum Note (Signed)
Addended by: Chauncey Cruel on: 11/13/2013 09:23 AM   Modules accepted: Level of Service

## 2013-11-14 ENCOUNTER — Other Ambulatory Visit (INDEPENDENT_AMBULATORY_CARE_PROVIDER_SITE_OTHER): Payer: Self-pay

## 2013-11-14 DIAGNOSIS — Z452 Encounter for adjustment and management of vascular access device: Secondary | ICD-10-CM | POA: Diagnosis not present

## 2013-11-14 DIAGNOSIS — Z853 Personal history of malignant neoplasm of breast: Secondary | ICD-10-CM | POA: Diagnosis not present

## 2013-11-14 DIAGNOSIS — Y839 Surgical procedure, unspecified as the cause of abnormal reaction of the patient, or of later complication, without mention of misadventure at the time of the procedure: Secondary | ICD-10-CM | POA: Diagnosis not present

## 2013-11-14 DIAGNOSIS — IMO0002 Reserved for concepts with insufficient information to code with codable children: Secondary | ICD-10-CM | POA: Diagnosis not present

## 2013-11-14 MED ORDER — OXYCODONE-ACETAMINOPHEN 5-325 MG PO TABS: 1.0000 | ORAL_TABLET | Freq: Four times a day (QID) | ORAL | Status: DC | PRN

## 2013-11-15 ENCOUNTER — Ambulatory Visit
Admission: RE | Admit: 2013-11-15 | Discharge: 2013-11-15 | Disposition: A | Discharge status: Home / Self Care | Payer: Medicare Other | Source: Ambulatory Visit | Attending: Radiation Oncology | Admitting: Radiation Oncology

## 2013-11-15 ENCOUNTER — Telehealth: Payer: Self-pay | Admitting: Radiation Oncology

## 2013-11-15 NOTE — Telephone Encounter (Signed)
Patient has not shown for 4:30 follow up appointment with Dr. Valere Dross. Phoned patient's cell and home. No answer. Left message on both requesting return call to reschedule. Dr. Valere Dross informed.

## 2013-11-28 ENCOUNTER — Encounter (INDEPENDENT_AMBULATORY_CARE_PROVIDER_SITE_OTHER): Payer: Self-pay | Admitting: General Surgery

## 2013-11-28 ENCOUNTER — Ambulatory Visit (INDEPENDENT_AMBULATORY_CARE_PROVIDER_SITE_OTHER): Payer: Medicare Other | Admitting: General Surgery

## 2013-11-28 VITALS — BP 126/82 | HR 77 | Temp 97.9°F | Ht 64.0 in | Wt 143.0 lb

## 2013-11-28 DIAGNOSIS — C50419 Malignant neoplasm of upper-outer quadrant of unspecified female breast: Secondary | ICD-10-CM

## 2013-11-28 DIAGNOSIS — C50411 Malignant neoplasm of upper-outer quadrant of right female breast: Secondary | ICD-10-CM

## 2013-11-28 NOTE — Patient Instructions (Signed)
Continue regular self exams  

## 2013-11-28 NOTE — Progress Notes (Signed)
Subjective:     Patient ID: Shannon Grant, female   DOB: 03-Apr-1945, 68 y.o.   MRN: [id number]  HPI The patient is a 68 year old white female who is 7 months status post right lumpectomy for breast cancer. Her pathology showed that the area of positivity was an intramammary lymph node. She was a triple negative. She finished chemotherapy and radiation therapy. She did develop a seroma which has persisted at the lumpectomy site. She recently had her port removed and the seroma aspirated. She tolerated this well  Review of Systems     Objective:   Physical Exam On exam her right breast incision is healing nicely with no sign of infection. The seroma is unchanged. She denies any pain. Her port site is healing nicely    Assessment:     The patient is 7 months status post right lumpectomy for breast cancer     Plan:     At this point she will continue to do regular self exams. I doubt that another aspiration of the stroma we'll make it go away and since it does not bother her we will continue to monitor this. I will plan to see her back in 6 months

## 2013-11-29 ENCOUNTER — Ambulatory Visit
Admission: RE | Admit: 2013-11-29 | Discharge: 2013-11-29 | Disposition: A | Discharge status: Home / Self Care | Payer: Medicare Other | Source: Ambulatory Visit | Attending: Radiation Oncology | Admitting: Radiation Oncology

## 2013-11-29 ENCOUNTER — Encounter: Payer: Self-pay | Admitting: Radiation Oncology

## 2013-11-29 VITALS — BP 162/78 | HR 69 | Temp 98.4°F | Resp 20 | Wt 144.7 lb

## 2013-11-29 DIAGNOSIS — C50411 Malignant neoplasm of upper-outer quadrant of right female breast: Secondary | ICD-10-CM

## 2013-11-29 DIAGNOSIS — I10 Essential (primary) hypertension: Secondary | ICD-10-CM | POA: Diagnosis not present

## 2013-11-29 DIAGNOSIS — N3000 Acute cystitis without hematuria: Secondary | ICD-10-CM | POA: Diagnosis not present

## 2013-11-29 NOTE — Progress Notes (Signed)
Followup note:  Ms. his returns today 2 months following completion of radiation therapy following conservative surgery and adjuvant chemotherapy in the management of her initial stage II A triple negative invasive ductal carcinoma of the right breast. She is without complaints today. She tells me that Dr.Toth aspirated her right breast seroma, but this recurred. She is not having any pain or discomfort.  Physical examination: Alert and oriented. Filed Vitals:   11/29/13 1609  BP: 162/78  Pulse: 69  Temp: 98.4 F (36.9 C)  Resp: 20   Head and neck examination: Regrowth of hair. Nodes: Without palpable cervical, supraclavicular, or axillary lymphadenopathy. Chest: Lungs clear. Breasts: There is a golf ball size seroma along the upper-outer quadrant adjacent to her partial mastectomy scar. This is nontender to palpation. No other masses are appreciated. Extremities: Without edema.  Impression: Satisfactory progress. She'll see Dr. Jana Hakim for labs a followup visit in late October. She can have a baseline right breast mammogram, and a screening left breast mammogram later this year or early next year at  [facility]. I also discussed with her the [facility] program for breast cancer survivors.  Plan: As above. Followup through Dr. Jana Hakim.

## 2013-11-29 NOTE — Progress Notes (Signed)
Follow up s/p rad txs right breast 08/23/13-10/05/13,  Still has a seroma size of golf ball ,not tender,   Well healed, still some tanning on breast ,Appetite good, energy level not good, doesn't take naps because then she would be awake all night,  Is taking a multivitamin 4:05 PM

## 2014-01-10 DIAGNOSIS — R399 Unspecified symptoms and signs involving the genitourinary system: Secondary | ICD-10-CM | POA: Diagnosis not present

## 2014-01-10 DIAGNOSIS — N390 Urinary tract infection, site not specified: Secondary | ICD-10-CM | POA: Diagnosis not present

## 2014-01-24 DIAGNOSIS — I10 Essential (primary) hypertension: Secondary | ICD-10-CM | POA: Diagnosis not present

## 2014-01-24 DIAGNOSIS — Z0000 Encounter for general adult medical examination without abnormal findings: Secondary | ICD-10-CM | POA: Diagnosis not present

## 2014-01-24 DIAGNOSIS — E039 Hypothyroidism, unspecified: Secondary | ICD-10-CM | POA: Diagnosis not present

## 2014-01-24 DIAGNOSIS — Z23 Encounter for immunization: Secondary | ICD-10-CM | POA: Diagnosis not present

## 2014-01-24 DIAGNOSIS — E780 Pure hypercholesterolemia: Secondary | ICD-10-CM | POA: Diagnosis not present

## 2014-01-24 DIAGNOSIS — Z1211 Encounter for screening for malignant neoplasm of colon: Secondary | ICD-10-CM | POA: Diagnosis not present

## 2014-01-24 DIAGNOSIS — C50919 Malignant neoplasm of unspecified site of unspecified female breast: Secondary | ICD-10-CM | POA: Diagnosis not present

## 2014-01-25 ENCOUNTER — Telehealth: Payer: Self-pay | Admitting: Oncology

## 2014-01-25 ENCOUNTER — Other Ambulatory Visit (HOSPITAL_BASED_OUTPATIENT_CLINIC_OR_DEPARTMENT_OTHER): Payer: Medicare Other

## 2014-01-25 ENCOUNTER — Ambulatory Visit (HOSPITAL_BASED_OUTPATIENT_CLINIC_OR_DEPARTMENT_OTHER): Payer: Medicare Other | Admitting: Oncology

## 2014-01-25 VITALS — BP 172/80 | HR 71 | Temp 98.9°F | Resp 18 | Ht 64.0 in | Wt 150.6 lb

## 2014-01-25 DIAGNOSIS — Z171 Estrogen receptor negative status [ER-]: Secondary | ICD-10-CM

## 2014-01-25 DIAGNOSIS — C50411 Malignant neoplasm of upper-outer quadrant of right female breast: Secondary | ICD-10-CM | POA: Diagnosis not present

## 2014-01-25 DIAGNOSIS — T451X5A Adverse effect of antineoplastic and immunosuppressive drugs, initial encounter: Secondary | ICD-10-CM

## 2014-01-25 DIAGNOSIS — D6481 Anemia due to antineoplastic chemotherapy: Secondary | ICD-10-CM

## 2014-01-25 DIAGNOSIS — G629 Polyneuropathy, unspecified: Secondary | ICD-10-CM

## 2014-01-25 LAB — CBC WITH DIFFERENTIAL/PLATELET
BASO%: 0.3 % (ref 0.0–2.0)
BASOS ABS: 0 10*3/uL (ref 0.0–0.1)
EOS ABS: 0.1 10*3/uL (ref 0.0–0.5)
EOS%: 1.5 % (ref 0.0–7.0)
HEMATOCRIT: 40.8 % (ref 34.8–46.6)
HGB: 13.7 g/dL (ref 11.6–15.9)
LYMPH%: 12.2 % — AB (ref 14.0–49.7)
MCH: 31.8 pg (ref 25.1–34.0)
MCHC: 33.6 g/dL (ref 31.5–36.0)
MCV: 94.7 fL (ref 79.5–101.0)
MONO#: 0.7 10*3/uL (ref 0.1–0.9)
MONO%: 10.9 % (ref 0.0–14.0)
NEUT#: 4.5 10*3/uL (ref 1.5–6.5)
NEUT%: 75.1 % (ref 38.4–76.8)
Platelets: 207 10*3/uL (ref 145–400)
RBC: 4.31 10*6/uL (ref 3.70–5.45)
RDW: 13.8 % (ref 11.2–14.5)
WBC: 6 10*3/uL (ref 3.9–10.3)
lymph#: 0.7 10*3/uL — ABNORMAL LOW (ref 0.9–3.3)

## 2014-01-25 LAB — COMPREHENSIVE METABOLIC PANEL (CC13)
ALBUMIN: 4.1 g/dL (ref 3.5–5.0)
ALK PHOS: 71 U/L (ref 40–150)
ALT: 21 U/L (ref 0–55)
AST: 19 U/L (ref 5–34)
Anion Gap: 8 mEq/L (ref 3–11)
BUN: 14.5 mg/dL (ref 7.0–26.0)
CO2: 27 mEq/L (ref 22–29)
Calcium: 10.1 mg/dL (ref 8.4–10.4)
Chloride: 105 mEq/L (ref 98–109)
Creatinine: 0.7 mg/dL (ref 0.6–1.1)
Glucose: 95 mg/dl (ref 70–140)
POTASSIUM: 3.7 meq/L (ref 3.5–5.1)
SODIUM: 141 meq/L (ref 136–145)
Total Bilirubin: 0.71 mg/dL (ref 0.20–1.20)
Total Protein: 6.8 g/dL (ref 6.4–8.3)

## 2014-01-25 NOTE — Telephone Encounter (Signed)
gv pt appt schedule for april 2016. per 10/28 pof f/u april 2016 with lab 1wk prior to f/u. no other orders.
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
## 2014-01-30 ENCOUNTER — Encounter: Payer: Self-pay | Admitting: Radiation Oncology

## 2014-03-02 ENCOUNTER — Encounter: Payer: Self-pay | Admitting: Gynecology

## 2014-03-02 ENCOUNTER — Ambulatory Visit (INDEPENDENT_AMBULATORY_CARE_PROVIDER_SITE_OTHER): Payer: Medicare Other | Admitting: Gynecology

## 2014-03-02 VITALS — BP 124/80 | Ht 64.0 in | Wt 155.0 lb

## 2014-03-02 DIAGNOSIS — C50911 Malignant neoplasm of unspecified site of right female breast: Secondary | ICD-10-CM

## 2014-03-02 DIAGNOSIS — N952 Postmenopausal atrophic vaginitis: Secondary | ICD-10-CM

## 2014-03-02 NOTE — Addendum Note (Signed)
Addended by: Joaquin Music on: 03/02/2014 02:02 PM   Modules accepted: Orders

## 2014-03-02 NOTE — Progress Notes (Signed)
Shannon Grant 08/24/1945 [id number]        68 y.o.  G3P3 for follow up exam. Several issues noted below.  Past medical history,surgical history, problem list, medications, allergies, family history and social history were all reviewed and documented as reviewed in the EPIC chart.  ROS:  12 system ROS performed with pertinent positives and negatives included in the history, assessment and plan.   Additional significant findings :  none   Exam: Kim Counsellor Vitals:   03/02/14 1152  BP: 124/80  Height: 5\' 4"  (1.626 m)  Weight: 155 lb (70.308 kg)   General appearance:  Normal affect, orientation and appearance. Skin: Grossly normal HEENT: Without gross lesions.  No cervical or supraclavicular adenopathy. Thyroid normal.  Lungs:  Clear without wheezing, rales or rhonchi Cardiac: RR, without RMG Abdominal:  Soft, nontender, without masses, guarding, rebound, organomegaly or hernia Breasts:  Examined lying and sitting. Left without masses, retractions, discharge or axillary adenopathy.   Write with golf ball size cystic feeling mass tail of Spence. Generalized breast changes consistent with post radiation. No other definitive masses retractions or adenopathy. Pelvic:  Ext/BUS/vagina with generalized atrophic changes  Cervix atrophic  Uterus anteverted, normal size, shape and contour, midline and mobile nontender   Adnexa  Without masses or tenderness    Anus and perineum  Normal   Rectovaginal  Normal sphincter tone without palpated masses or tenderness.    Assessment/Plan:  68 y.o. G3P3 female for follow up exam.   1. Right breast cancer diagnosed this past year status post lumpectomy 02/2013. Follow up radiation chemotherapy. Actively being followed by oncology. Cystic golfball sized mass right tail of Spence. Patient reports this being present since the surgery and actually getting smaller over time. Was partially aspirated once with yellow to bloody fluid consistent with  solving hematoma. Patient states that Dr. Jana Hakim is actively following her for this and in fact has improved to see him this month. She'll continue to follow up with him for this and his recommendation as far as mammographic timing. 2. Postmenopausal/atrophic genital changes.  Without significant symptoms of hot flashes, night sweats, vaginal dryness. Not socially active. No vaginal bleeding. Continue to monitor. Report any vaginal bleeding. 3. Pap smear 2014. No Pap smear done today. No history of significant abnormal Pap smears. Options to stop screening altogether or less frequent screening intervals reviewed that she is over the age of 62. Will readdress on an annual basis. 4. DEXA reported 6 years ago. No copies of these reports. Recommend baseline DEXA now she agrees to schedule. Increased calcium vitamin D reviewed. 5. Colonoscopy 5-6 years ago. Patient is going to check and see when they recommend repeat colonoscopy. 6. Health maintenance. No routine blood work done as she reports this done at her primary physician's office. Follow up 1 year, sooner as needed.       Anastasio Auerbach MD, 12:42 PM 03/02/2014

## 2014-03-02 NOTE — Patient Instructions (Signed)
Continue to follow up with your oncologist as recommended.  You may obtain a copy of any labs that were done today by logging onto MyChart as outlined in the instructions provided with your AVS (after visit summary). The office will not call with normal lab results but certainly if there are any significant abnormalities then we will contact you.   Health Maintenance, Female A healthy lifestyle and preventative care can promote health and wellness.  Maintain regular health, dental, and eye exams.  Eat a healthy diet. Foods like vegetables, fruits, whole grains, low-fat dairy products, and lean protein foods contain the nutrients you need without too many calories. Decrease your intake of foods high in solid fats, added sugars, and salt. Get information about a proper diet from your caregiver, if necessary.  Regular physical exercise is one of the most important things you can do for your health. Most adults should get at least 150 minutes of moderate-intensity exercise (any activity that increases your heart rate and causes you to sweat) each week. In addition, most adults need muscle-strengthening exercises on 2 or more days a week.   Maintain a healthy weight. The body mass index (BMI) is a screening tool to identify possible weight problems. It provides an estimate of body fat based on height and weight. Your caregiver can help determine your BMI, and can help you achieve or maintain a healthy weight. For adults 20 years and older:  A BMI below 18.5 is considered underweight.  A BMI of 18.5 to 24.9 is normal.  A BMI of 25 to 29.9 is considered overweight.  A BMI of 30 and above is considered obese.  Maintain normal blood lipids and cholesterol by exercising and minimizing your intake of saturated fat. Eat a balanced diet with plenty of fruits and vegetables. Blood tests for lipids and cholesterol should begin at age 30 and be repeated every 5 years. If your lipid or cholesterol levels are  high, you are over 50, or you are a high risk for heart disease, you may need your cholesterol levels checked more frequently.Ongoing high lipid and cholesterol levels should be treated with medicines if diet and exercise are not effective.  If you smoke, find out from your caregiver how to quit. If you do not use tobacco, do not start.  Lung cancer screening is recommended for adults aged 48 80 years who are at high risk for developing lung cancer because of a history of smoking. Yearly low-dose computed tomography (CT) is recommended for people who have at least a 30-pack-year history of smoking and are a current smoker or have quit within the past 15 years. A pack year of smoking is smoking an average of 1 pack of cigarettes a day for 1 year (for example: 1 pack a day for 30 years or 2 packs a day for 15 years). Yearly screening should continue until the smoker has stopped smoking for at least 15 years. Yearly screening should also be stopped for people who develop a health problem that would prevent them from having lung cancer treatment.  If you are pregnant, do not drink alcohol. If you are breastfeeding, be very cautious about drinking alcohol. If you are not pregnant and choose to drink alcohol, do not exceed 1 drink per day. One drink is considered to be 12 ounces (355 mL) of beer, 5 ounces (148 mL) of wine, or 1.5 ounces (44 mL) of liquor.  Avoid use of street drugs. Do not share needles with anyone. Ask  for help if you need support or instructions about stopping the use of drugs.  High blood pressure causes heart disease and increases the risk of stroke. Blood pressure should be checked at least every 1 to 2 years. Ongoing high blood pressure should be treated with medicines, if weight loss and exercise are not effective.  If you are 59 to 69 years old, ask your caregiver if you should take aspirin to prevent strokes.  Diabetes screening involves taking a blood sample to check your fasting  blood sugar level. This should be done once every 3 years, after age 71, if you are within normal weight and without risk factors for diabetes. Testing should be considered at a younger age or be carried out more frequently if you are overweight and have at least 1 risk factor for diabetes.  Breast cancer screening is essential preventative care for women. You should practice "breast self-awareness." This means understanding the normal appearance and feel of your breasts and may include breast self-examination. Any changes detected, no matter how small, should be reported to a caregiver. Women in their 28s and 30s should have a clinical breast exam (CBE) by a caregiver as part of a regular health exam every 1 to 3 years. After age 32, women should have a CBE every year. Starting at age 36, women should consider having a mammogram (breast X-ray) every year. Women who have a family history of breast cancer should talk to their caregiver about genetic screening. Women at a high risk of breast cancer should talk to their caregiver about having an MRI and a mammogram every year.  Breast cancer gene (BRCA)-related cancer risk assessment is recommended for women who have family members with BRCA-related cancers. BRCA-related cancers include breast, ovarian, tubal, and peritoneal cancers. Having family members with these cancers may be associated with an increased risk for harmful changes (mutations) in the breast cancer genes BRCA1 and BRCA2. Results of the assessment will determine the need for genetic counseling and BRCA1 and BRCA2 testing.  The Pap test is a screening test for cervical cancer. Women should have a Pap test starting at age 14. Between ages 14 and 72, Pap tests should be repeated every 2 years. Beginning at age 30, you should have a Pap test every 3 years as long as the past 3 Pap tests have been normal. If you had a hysterectomy for a problem that was not cancer or a condition that could lead to  cancer, then you no longer need Pap tests. If you are between ages 62 and 35, and you have had normal Pap tests going back 10 years, you no longer need Pap tests. If you have had past treatment for cervical cancer or a condition that could lead to cancer, you need Pap tests and screening for cancer for at least 20 years after your treatment. If Pap tests have been discontinued, risk factors (such as a new sexual partner) need to be reassessed to determine if screening should be resumed. Some women have medical problems that increase the chance of getting cervical cancer. In these cases, your caregiver may recommend more frequent screening and Pap tests.  The human papillomavirus (HPV) test is an additional test that may be used for cervical cancer screening. The HPV test looks for the virus that can cause the cell changes on the cervix. The cells collected during the Pap test can be tested for HPV. The HPV test could be used to screen women aged 46 years and  older, and should be used in women of any age who have unclear Pap test results. After the age of 97, women should have HPV testing at the same frequency as a Pap test.  Colorectal cancer can be detected and often prevented. Most routine colorectal cancer screening begins at the age of 35 and continues through age 25. However, your caregiver may recommend screening at an earlier age if you have risk factors for colon cancer. On a yearly basis, your caregiver may provide home test kits to check for hidden blood in the stool. Use of a small camera at the end of a tube, to directly examine the colon (sigmoidoscopy or colonoscopy), can detect the earliest forms of colorectal cancer. Talk to your caregiver about this at age 55, when routine screening begins. Direct examination of the colon should be repeated every 5 to 10 years through age 78, unless early forms of pre-cancerous polyps or small growths are found.  Hepatitis C blood testing is recommended for  all people born from [birth year] through 1966 and any individual with known risks for hepatitis C.  Practice safe sex. Use condoms and avoid high-risk sexual practices to reduce the spread of sexually transmitted infections (STIs). Sexually active women aged 29 and younger should be checked for Chlamydia, which is a common sexually transmitted infection. Older women with new or multiple partners should also be tested for Chlamydia. Testing for other STIs is recommended if you are sexually active and at increased risk.  Osteoporosis is a disease in which the bones lose minerals and strength with aging. This can result in serious bone fractures. The risk of osteoporosis can be identified using a bone density scan. Women ages 34 and over and women at risk for fractures or osteoporosis should discuss screening with their caregivers. Ask your caregiver whether you should be taking a calcium supplement or vitamin D to reduce the rate of osteoporosis.  Menopause can be associated with physical symptoms and risks. Hormone replacement therapy is available to decrease symptoms and risks. You should talk to your caregiver about whether hormone replacement therapy is right for you.  Use sunscreen. Apply sunscreen liberally and repeatedly throughout the day. You should seek shade when your shadow is shorter than you. Protect yourself by wearing long sleeves, pants, a wide-brimmed hat, and sunglasses year round, whenever you are outdoors.  Notify your caregiver of new moles or changes in moles, especially if there is a change in shape or color. Also notify your caregiver if a mole is larger than the size of a pencil eraser.  Stay current with your immunizations. Document Released: 09/30/2010 Document Revised: 07/12/2012 Document Reviewed: 09/30/2010 [facility] Patient Information 2014 [place].

## 2014-03-10 DIAGNOSIS — H2513 Age-related nuclear cataract, bilateral: Secondary | ICD-10-CM | POA: Diagnosis not present

## 2014-03-10 DIAGNOSIS — H5203 Hypermetropia, bilateral: Secondary | ICD-10-CM | POA: Diagnosis not present

## 2014-03-10 DIAGNOSIS — H524 Presbyopia: Secondary | ICD-10-CM | POA: Diagnosis not present

## 2014-03-10 DIAGNOSIS — H52223 Regular astigmatism, bilateral: Secondary | ICD-10-CM | POA: Diagnosis not present

## 2014-03-14 ENCOUNTER — Other Ambulatory Visit: Payer: Self-pay | Admitting: Gynecology

## 2014-03-14 ENCOUNTER — Ambulatory Visit (INDEPENDENT_AMBULATORY_CARE_PROVIDER_SITE_OTHER): Payer: Medicare Other

## 2014-03-14 DIAGNOSIS — Z780 Asymptomatic menopausal state: Secondary | ICD-10-CM

## 2014-03-14 DIAGNOSIS — N952 Postmenopausal atrophic vaginitis: Secondary | ICD-10-CM

## 2014-03-29 ENCOUNTER — Ambulatory Visit (HOSPITAL_BASED_OUTPATIENT_CLINIC_OR_DEPARTMENT_OTHER): Payer: Medicare Other | Admitting: Oncology

## 2014-03-29 VITALS — BP 189/74 | HR 69 | Temp 98.4°F | Resp 17 | Ht 64.0 in | Wt 157.8 lb

## 2014-03-29 DIAGNOSIS — T451X5A Adverse effect of antineoplastic and immunosuppressive drugs, initial encounter: Principal | ICD-10-CM

## 2014-03-29 DIAGNOSIS — Z853 Personal history of malignant neoplasm of breast: Secondary | ICD-10-CM

## 2014-03-29 DIAGNOSIS — I10 Essential (primary) hypertension: Secondary | ICD-10-CM

## 2014-03-29 DIAGNOSIS — C50411 Malignant neoplasm of upper-outer quadrant of right female breast: Secondary | ICD-10-CM

## 2014-03-29 DIAGNOSIS — D6481 Anemia due to antineoplastic chemotherapy: Secondary | ICD-10-CM
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
## 2014-04-25 DIAGNOSIS — R399 Unspecified symptoms and signs involving the genitourinary system: Secondary | ICD-10-CM | POA: Diagnosis not present

## 2014-04-25 DIAGNOSIS — N390 Urinary tract infection, site not specified: Secondary | ICD-10-CM | POA: Diagnosis not present

## 2014-05-18 ENCOUNTER — Telehealth: Payer: Self-pay | Admitting: *Deleted

## 2014-05-18 DIAGNOSIS — Z853 Personal history of malignant neoplasm of breast: Secondary | ICD-10-CM | POA: Diagnosis not present

## 2014-05-18 DIAGNOSIS — I890 Lymphedema, not elsewhere classified: Secondary | ICD-10-CM | POA: Diagnosis not present

## 2014-05-18 NOTE — Telephone Encounter (Signed)
PT. IS UNSURE WHEN HER BREAST BEGAN HAVING INTERMITTENT ACHINESS AT A SCALE OF THREE. THE BREAST IS LARGER, FIRMER AND FEELS THICKER. THERE IS NO REDNESS BUT THE RIGHT BREAST FEELS WARMER THAN THE LEFT BREAST. SPOKE TO DR.MAGRINAT'S NURSE, VAL DOOD,RN. PT. NEEDS TO SEE HER SURGEON. NOTIFIED PT. SHE VOICES UNDERSTANDING.

## 2014-05-19 ENCOUNTER — Other Ambulatory Visit (INDEPENDENT_AMBULATORY_CARE_PROVIDER_SITE_OTHER): Payer: Self-pay | Admitting: General Surgery

## 2014-05-19 DIAGNOSIS — C50911 Malignant neoplasm of unspecified site of right female breast: Secondary | ICD-10-CM

## 2014-05-19 NOTE — Addendum Note (Signed)
Addended by: Stark Klein on: 05/19/2014 01:25 PM   Modules accepted: Orders

## 2014-05-22 ENCOUNTER — Other Ambulatory Visit (INDEPENDENT_AMBULATORY_CARE_PROVIDER_SITE_OTHER): Payer: Self-pay

## 2014-05-22 ENCOUNTER — Ambulatory Visit: Payer: Medicare Other | Attending: General Surgery | Admitting: Physical Therapy

## 2014-05-22 DIAGNOSIS — C50911 Malignant neoplasm of unspecified site of right female breast: Secondary | ICD-10-CM

## 2014-05-22 DIAGNOSIS — I890 Lymphedema, not elsewhere classified: Secondary | ICD-10-CM
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
## 2014-05-23 ENCOUNTER — Telehealth: Payer: Self-pay | Admitting: *Deleted

## 2014-05-23 NOTE — Telephone Encounter (Signed)
"  I've gone to physical therapy but do not understand why I need this.  I do not have any problems.  I had a lumpectomy on the right breast and have finished radiation.  I have swelling in my right breast but that's all." Exercises to help prevent Lymphedema is why Physical therapy is ordered is rational provided. Patient states she will continue PT.

## 2014-05-24 ENCOUNTER — Ambulatory Visit: Payer: Medicare Other | Admitting: Physical Therapy

## 2014-05-25 ENCOUNTER — Ambulatory Visit: Payer: Medicare Other | Admitting: Physical Therapy

## 2014-05-25 DIAGNOSIS — I890 Lymphedema, not elsewhere classified: Secondary | ICD-10-CM | POA: Diagnosis not present

## 2014-05-25 DIAGNOSIS — Z853 Personal history of malignant neoplasm of breast: Secondary | ICD-10-CM | POA: Diagnosis not present

## 2014-05-25 DIAGNOSIS — R928 Other abnormal and inconclusive findings on diagnostic imaging of breast: Secondary | ICD-10-CM | POA: Diagnosis not present
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
## 2014-05-26 ENCOUNTER — Other Ambulatory Visit: Payer: Self-pay | Admitting: Nurse Practitioner

## 2014-05-29 DIAGNOSIS — I10 Essential (primary) hypertension: Secondary | ICD-10-CM | POA: Diagnosis not present

## 2014-05-29 DIAGNOSIS — E039 Hypothyroidism, unspecified: Secondary | ICD-10-CM | POA: Diagnosis not present

## 2014-05-30 ENCOUNTER — Ambulatory Visit: Payer: Medicare Other | Attending: General Surgery | Admitting: Physical Therapy

## 2014-05-30 DIAGNOSIS — I890 Lymphedema, not elsewhere classified: Secondary | ICD-10-CM | POA: Insufficient documentation

## 2014-05-30 NOTE — Patient Instructions (Signed)
Reinforced pt wearing compression foam under bra for compression to lateral chest. Showed her WearEase style of bra
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
## 2014-06-01 ENCOUNTER — Ambulatory Visit: Payer: Medicare Other | Admitting: Physical Therapy

## 2014-06-01 DIAGNOSIS — I890 Lymphedema, not elsewhere classified: Secondary | ICD-10-CM | POA: Diagnosis not present
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
## 2014-06-06 ENCOUNTER — Ambulatory Visit: Payer: Medicare Other | Admitting: Physical Therapy

## 2014-06-06 ENCOUNTER — Telehealth: Payer: Self-pay | Admitting: Oncology

## 2014-06-06 DIAGNOSIS — I890 Lymphedema, not elsewhere classified: Secondary | ICD-10-CM | POA: Diagnosis not present

## 2014-06-06 NOTE — Telephone Encounter (Signed)
per GM to move pt to [facility] on call-cld * left pt a message of updated time & date of r/s appt
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
## 2014-06-08 ENCOUNTER — Ambulatory Visit: Payer: Medicare Other

## 2014-06-08 DIAGNOSIS — I890 Lymphedema, not elsewhere classified: Secondary | ICD-10-CM
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
## 2014-06-13 ENCOUNTER — Ambulatory Visit: Payer: Medicare Other | Admitting: Physical Therapy

## 2014-06-13 DIAGNOSIS — I890 Lymphedema, not elsewhere classified: Secondary | ICD-10-CM

## 2014-06-13 IMAGING — CT NM PET TUM IMG INITIAL (PI) SKULL BASE T - THIGH
1 of 6 series · 1 of 25 positions shown · IV contrast (350 OM)
Comparison: None.

CLINICAL DATA: Initial treatment strategy for breast cancer.

EXAM:
NUCLEAR MEDICINE PET SKULL BASE TO THIGH
FASTING BLOOD GLUCOSE:  Value: 90mg/dl
TECHNIQUE: 14.3 mCi F-18 FDG was injected intravenously. CT data was obtained
and used for attenuation correction and anatomic localization only.
(This was not acquired as a diagnostic CT examination.) Additional
exam technical data entered on technologist worksheet.

[Series 2: ct slices · axial · 3.8mm · 0.98mm/px · 1 of 267 slices shown]
[im 267/267  brain]
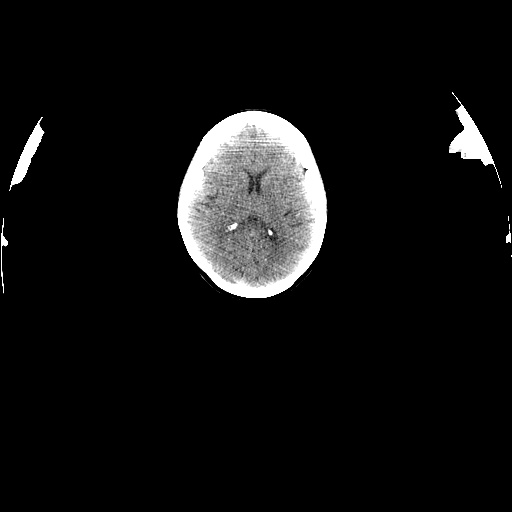

[1 of 25 positions shown; findings below may reference images not displayed]

FINDINGS: NECK

No hypermetabolic lymph nodes in the neck.

CHEST

No hypermetabolic mediastinal or hilar nodes. No suspicious
pulmonary nodules on the CT scan. 3 mm right upper lobe nodule is
identified, image 96/series 2. This is too small to characterize. In
the right middle lobe there is a 4 mm nodule, image 104/series 2.
This is also too small to characterize. No hypermetabolic pulmonary
nodules or mass is identified. No enlarged or hypermetabolic
mediastinal or hilar lymph nodes identified.

Fluid attenuating structure in the right breast with surrounding
surgical clips measures 6.2 x 4.8 cm and 12 HU. This is compatible
with resection cavity. There is no enlarged or hypermetabolic
axillary or supraclavicular lymph nodes identified.

ABDOMEN/PELVIS

No abnormal hypermetabolic activity within the liver, pancreas,
adrenal glands, or spleen. No hypermetabolic lymph nodes in the
abdomen or pelvis. Multiple cysts are identified within the liver.
Enlarged uterus containing calcified fibroids are identified.
Calcified atherosclerotic disease affects the abdominal aorta. There
is no aneurysm.

SKELETON

Advanced osteoarthritis involves the left hip. Previous right hip
arthroplasty. Degenerative disc disease is noted within the lumbar
spine.
IMPRESSION: 1. Status post right breast lumpectomy.
2. No evidence for metastatic disease.
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
## 2014-06-15 ENCOUNTER — Ambulatory Visit: Payer: Medicare Other | Admitting: Physical Therapy

## 2014-06-15 ENCOUNTER — Other Ambulatory Visit: Payer: Self-pay | Admitting: General Surgery

## 2014-06-15 DIAGNOSIS — N6320 Unspecified lump in the left breast, unspecified quadrant: Secondary | ICD-10-CM

## 2014-06-15 DIAGNOSIS — C50911 Malignant neoplasm of unspecified site of right female breast: Secondary | ICD-10-CM | POA: Diagnosis not present

## 2014-06-15 DIAGNOSIS — I890 Lymphedema, not elsewhere classified: Secondary | ICD-10-CM | POA: Diagnosis not present
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
## 2014-06-20 ENCOUNTER — Ambulatory Visit: Payer: Medicare Other | Admitting: Physical Therapy

## 2014-06-20 DIAGNOSIS — I890 Lymphedema, not elsewhere classified: Secondary | ICD-10-CM
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
## 2014-06-21 DIAGNOSIS — Z853 Personal history of malignant neoplasm of breast: Secondary | ICD-10-CM | POA: Diagnosis not present

## 2014-06-21 DIAGNOSIS — N63 Unspecified lump in breast: Secondary | ICD-10-CM | POA: Diagnosis not present

## 2014-06-22 ENCOUNTER — Ambulatory Visit: Payer: Medicare Other | Admitting: Physical Therapy

## 2014-06-27 ENCOUNTER — Ambulatory Visit: Payer: Medicare Other | Admitting: Physical Therapy

## 2014-07-12 ENCOUNTER — Other Ambulatory Visit (HOSPITAL_BASED_OUTPATIENT_CLINIC_OR_DEPARTMENT_OTHER): Payer: Medicare Other

## 2014-07-12 DIAGNOSIS — C50411 Malignant neoplasm of upper-outer quadrant of right female breast: Secondary | ICD-10-CM

## 2014-07-12 DIAGNOSIS — C50419 Malignant neoplasm of upper-outer quadrant of unspecified female breast: Secondary | ICD-10-CM | POA: Diagnosis not present

## 2014-07-12 LAB — CBC WITH DIFFERENTIAL/PLATELET
BASO%: 1.3 % (ref 0.0–2.0)
Basophils Absolute: 0.1 10*3/uL (ref 0.0–0.1)
EOS%: 3.1 % (ref 0.0–7.0)
Eosinophils Absolute: 0.1 10*3/uL (ref 0.0–0.5)
HCT: 44.8 % (ref 34.8–46.6)
HGB: 14.4 g/dL (ref 11.6–15.9)
LYMPH%: 17.7 % (ref 14.0–49.7)
MCH: 29.8 pg (ref 25.1–34.0)
MCHC: 32.2 g/dL (ref 31.5–36.0)
MCV: 92.6 fL (ref 79.5–101.0)
MONO#: 0.5 10*3/uL (ref 0.1–0.9)
MONO%: 11.7 % (ref 0.0–14.0)
NEUT%: 66.2 % (ref 38.4–76.8)
NEUTROS ABS: 2.9 10*3/uL (ref 1.5–6.5)
PLATELETS: 220 10*3/uL (ref 145–400)
RBC: 4.84 10*6/uL (ref 3.70–5.45)
RDW: 14.3 % (ref 11.2–14.5)
WBC: 4.4 10*3/uL (ref 3.9–10.3)
lymph#: 0.8 10*3/uL — ABNORMAL LOW (ref 0.9–3.3)

## 2014-07-12 LAB — COMPREHENSIVE METABOLIC PANEL
ALBUMIN: 4.4 g/dL (ref 3.5–5.2)
ALT: 15 U/L (ref 0–35)
AST: 20 U/L (ref 0–37)
Alkaline Phosphatase: 50 U/L (ref 39–117)
BUN: 12 mg/dL (ref 6–23)
CALCIUM: 10.1 mg/dL (ref 8.4–10.5)
CHLORIDE: 103 meq/L (ref 96–112)
CO2: 24 meq/L (ref 19–32)
Creatinine, Ser: 0.64 mg/dL (ref 0.50–1.10)
Glucose, Bld: 93 mg/dL (ref 70–99)
POTASSIUM: 3.7 meq/L (ref 3.5–5.3)
Sodium: 139 mEq/L (ref 135–145)
Total Bilirubin: 0.7 mg/dL (ref 0.2–1.2)
Total Protein: 6.7 g/dL (ref 6.0–8.3)

## 2014-07-18 ENCOUNTER — Telehealth: Payer: Self-pay | Admitting: Oncology

## 2014-07-18 ENCOUNTER — Ambulatory Visit: Payer: Medicare Other | Admitting: Oncology

## 2014-07-18 NOTE — Telephone Encounter (Signed)
pt cld left vm wanting time of appt 4/20-cld 7 spoke to pt and adv of time & date-*pt understood

## 2014-07-19 ENCOUNTER — Encounter: Payer: Self-pay | Admitting: Nurse Practitioner

## 2014-07-19 ENCOUNTER — Telehealth: Payer: Self-pay | Admitting: Nurse Practitioner

## 2014-07-19 ENCOUNTER — Ambulatory Visit (HOSPITAL_BASED_OUTPATIENT_CLINIC_OR_DEPARTMENT_OTHER): Payer: Medicare Other | Admitting: Nurse Practitioner

## 2014-07-19 VITALS — BP 172/79 | HR 72 | Temp 97.6°F | Resp 18 | Ht 64.0 in | Wt 157.0 lb

## 2014-07-19 DIAGNOSIS — Z171 Estrogen receptor negative status [ER-]: Secondary | ICD-10-CM | POA: Diagnosis not present

## 2014-07-19 DIAGNOSIS — Z803 Family history of malignant neoplasm of breast: Secondary | ICD-10-CM | POA: Diagnosis not present

## 2014-07-19 DIAGNOSIS — R030 Elevated blood-pressure reading, without diagnosis of hypertension: Secondary | ICD-10-CM | POA: Diagnosis not present

## 2014-07-19 DIAGNOSIS — C50411 Malignant neoplasm of upper-outer quadrant of right female breast: Secondary | ICD-10-CM

## 2014-07-19 DIAGNOSIS — I10 Essential (primary) hypertension: Secondary | ICD-10-CM

## 2014-07-19 MED ORDER — LISINOPRIL 10 MG PO TABS: 10.0000 mg | ORAL_TABLET | Freq: Every day | ORAL | Status: DC

## 2014-07-19 NOTE — Telephone Encounter (Signed)
per pof to sch pt appt-gave pt copy of sch °
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
## 2014-07-19 NOTE — Addendum Note (Signed)
Addended by: Marcelino Duster on: 07/19/2014 06:20 PM   Modules accepted: Orders

## 2014-09-07 DIAGNOSIS — M7061 Trochanteric bursitis, right hip: Secondary | ICD-10-CM | POA: Diagnosis not present

## 2014-09-07 DIAGNOSIS — M1611 Unilateral primary osteoarthritis, right hip: Secondary | ICD-10-CM | POA: Diagnosis not present

## 2014-09-18 ENCOUNTER — Telehealth: Payer: Self-pay | Admitting: *Deleted

## 2014-09-18 ENCOUNTER — Other Ambulatory Visit: Payer: Self-pay | Admitting: Nurse Practitioner

## 2014-09-18 ENCOUNTER — Other Ambulatory Visit: Payer: Self-pay | Admitting: Oncology

## 2014-09-18 NOTE — Telephone Encounter (Signed)
TC from patient who reports she has found a new lump/fullness on her right breast, near incisional area and a fullness in her right axilla. She had surgery to this area about 1 year ago. She would like Dr. Virgie Dad opinion and an appt to assess this area.

## 2014-09-18 NOTE — Telephone Encounter (Signed)
LMOVM - GM has opening today at 315.  Pt to return call to clinic if she can make this appt.

## 2014-09-19 ENCOUNTER — Other Ambulatory Visit: Payer: Self-pay | Admitting: *Deleted

## 2014-09-19 ENCOUNTER — Other Ambulatory Visit: Payer: Self-pay | Admitting: Oncology

## 2014-09-19 ENCOUNTER — Ambulatory Visit (HOSPITAL_BASED_OUTPATIENT_CLINIC_OR_DEPARTMENT_OTHER): Payer: Medicare Other | Admitting: Nurse Practitioner

## 2014-09-19 ENCOUNTER — Telehealth: Payer: Self-pay | Admitting: *Deleted

## 2014-09-19 VITALS — BP 168/71 | HR 73 | Temp 98.5°F | Resp 16 | Wt 158.6 lb

## 2014-09-19 DIAGNOSIS — N644 Mastodynia: Secondary | ICD-10-CM | POA: Diagnosis not present

## 2014-09-19 DIAGNOSIS — N649 Disorder of breast, unspecified: Secondary | ICD-10-CM

## 2014-09-19 DIAGNOSIS — C50411 Malignant neoplasm of upper-outer quadrant of right female breast: Secondary | ICD-10-CM

## 2014-09-19 MED ORDER — LISINOPRIL 10 MG PO TABS: 10.0000 mg | ORAL_TABLET | Freq: Every day | ORAL | Status: AC

## 2014-09-19 NOTE — Telephone Encounter (Signed)
Called pt concerning refill on Lisinopril 10 mg tablet. No answer, but left a VM for pt that I have refilled this medication with 1 refill only. Reminded her to seek making an appt with PCP to evaluate and monitor her BP's more closely as discussed during her last visit with NP in April, 2016. Told the patient she can call this nurse back if she has any questions. Message to be forwarded to Gentry Fitz, NP.

## 2014-09-20 ENCOUNTER — Encounter: Payer: Self-pay | Admitting: Nurse Practitioner

## 2014-09-20 ENCOUNTER — Telehealth: Payer: Self-pay | Admitting: Oncology

## 2014-09-20 ENCOUNTER — Telehealth: Payer: Self-pay

## 2014-09-20 NOTE — Assessment & Plan Note (Signed)
Patient is status post right breast lumpectomy in December 2014.  She completed chemotherapy in April 2015; and completed radiation therapy in July 2015.  Patient states that she last underwent a mammogram in 2015 per her primary care physician Dr. Isaiah Blakes.(Unable to review these most recent mammogram results in the electronic chart.  Patient states that the mammogram was done at [facility]).   Patient reports finding a new mass directly above her right breast lumpectomy site recently; and is also complaining of new pain to her left breast area at approximately 3:00.  On exam.-Right breast lumpectomy site at approximately 9 to 10:00 region; with firm mass palpated rectally above the lumpectomy surgical site.  There is no obvious erythema, edema, tenderness, or red streaks to the site.  Also, no obvious masses, erythema, warmth, edema, or tenderness to the left breast on exam.  Advised patient would attempt to obtain her most recent mammogram results from [facility].  Also, will obtain a bilateral diagnostic mammogram as soon as possible for further evaluation.  Patient is scheduled for follow-up labs and visit on 01/30/2015.

## 2014-09-20 NOTE — Telephone Encounter (Signed)
s.w. pt and advised on mammo.Marland KitchenMarland KitchenMarland KitchenMarland Kitchenpt ok and aware

## 2014-09-20 NOTE — Telephone Encounter (Signed)
Called to follow up with pt after [facility] visit yesterday 6/21. Pt states she is doing well this afternoon. Informed her she should hear from the scheduling department within the next week or so to get her [place] scheduled per CB. Pt verbalized understanding and denies any questions or concerns at this time
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
## 2014-09-21 ENCOUNTER — Other Ambulatory Visit: Payer: Self-pay | Admitting: Nurse Practitioner

## 2014-09-21 ENCOUNTER — Other Ambulatory Visit: Payer: Self-pay | Admitting: General Surgery

## 2014-09-21 ENCOUNTER — Other Ambulatory Visit: Payer: Self-pay

## 2014-09-21 DIAGNOSIS — N6320 Unspecified lump in the left breast, unspecified quadrant: Secondary | ICD-10-CM

## 2014-09-21 DIAGNOSIS — C50411 Malignant neoplasm of upper-outer quadrant of right female breast: Secondary | ICD-10-CM

## 2014-09-21 NOTE — Telephone Encounter (Signed)
lst ov 07/18/14.  Next ov 01/30/15.  Chart reviewed

## 2014-09-22 ENCOUNTER — Ambulatory Visit
Admission: RE | Admit: 2014-09-22 | Discharge: 2014-09-22 | Disposition: A | Discharge status: Home / Self Care | Payer: Medicare Other | Source: Ambulatory Visit | Attending: General Surgery | Admitting: General Surgery

## 2014-09-22 ENCOUNTER — Ambulatory Visit
Admission: RE | Admit: 2014-09-22 | Discharge: 2014-09-22 | Disposition: A | Discharge status: Home / Self Care | Payer: Medicare Other | Source: Ambulatory Visit | Attending: Nurse Practitioner | Admitting: Nurse Practitioner

## 2014-09-22 DIAGNOSIS — N6320 Unspecified lump in the left breast, unspecified quadrant: Secondary | ICD-10-CM

## 2014-09-22 DIAGNOSIS — C50411 Malignant neoplasm of upper-outer quadrant of right female breast: Secondary | ICD-10-CM

## 2014-09-23 ENCOUNTER — Other Ambulatory Visit: Payer: Self-pay | Admitting: Nurse Practitioner

## 2014-09-24 DIAGNOSIS — I10 Essential (primary) hypertension: Secondary | ICD-10-CM | POA: Diagnosis not present

## 2014-09-25 ENCOUNTER — Other Ambulatory Visit: Payer: Self-pay

## 2014-09-26 ENCOUNTER — Other Ambulatory Visit: Payer: Self-pay | Admitting: *Deleted

## 2014-09-26 DIAGNOSIS — Z01818 Encounter for other preprocedural examination: Secondary | ICD-10-CM | POA: Diagnosis not present

## 2014-09-26 DIAGNOSIS — C50919 Malignant neoplasm of unspecified site of unspecified female breast: Secondary | ICD-10-CM | POA: Diagnosis not present

## 2014-09-26 DIAGNOSIS — E780 Pure hypercholesterolemia: Secondary | ICD-10-CM | POA: Diagnosis not present

## 2014-09-26 DIAGNOSIS — I10 Essential (primary) hypertension: Secondary | ICD-10-CM | POA: Diagnosis not present

## 2014-09-26 DIAGNOSIS — E039 Hypothyroidism, unspecified: Secondary | ICD-10-CM | POA: Diagnosis not present

## 2014-09-27 ENCOUNTER — Ambulatory Visit: Admission: RE | Admit: 2014-09-27 | Payer: Medicare Other | Source: Ambulatory Visit

## 2014-09-27 ENCOUNTER — Ambulatory Visit
Admission: RE | Admit: 2014-09-27 | Discharge: 2014-09-27 | Disposition: A | Discharge status: Home / Self Care | Payer: Medicare Other | Source: Ambulatory Visit | Attending: Nurse Practitioner | Admitting: Nurse Practitioner

## 2014-09-27 DIAGNOSIS — Z853 Personal history of malignant neoplasm of breast: Secondary | ICD-10-CM | POA: Diagnosis not present

## 2014-09-27 DIAGNOSIS — R928 Other abnormal and inconclusive findings on diagnostic imaging of breast: Secondary | ICD-10-CM | POA: Diagnosis not present

## 2014-10-04 ENCOUNTER — Telehealth: Payer: Self-pay | Admitting: *Deleted

## 2014-10-04 NOTE — Telephone Encounter (Signed)
VM message received @ 11:48 am regarding right breast pain.  TC to patient. She was seen by Selena Lesser, NP on 09/19/14 for this and had bilateral mammograms which did not reveal any abnormality except for expected surgical changes from lumpectomy.  Reassured pt that mammogram was good. Advised pt that this might be post surgical discomfort or post Radiation therapy discomfort. Advised her to try warm, moist compresses and Ibuprofen for discomfort.  She has not done either of those. She verbalized understanding. Encouraged her to call back if these measures did not help.

## 2014-11-20 DIAGNOSIS — M5412 Radiculopathy, cervical region: Secondary | ICD-10-CM | POA: Diagnosis not present

## 2014-11-21 ENCOUNTER — Telehealth: Payer: Self-pay | Admitting: *Deleted

## 2014-11-21 NOTE — Telephone Encounter (Signed)
"  I am a patient of Dr. Jana Hakim.  I also need to have hip surgery.  I need to know if this is feasible to be done as a cancer patient.  My next F/U with Dr. Jana Hakim will be is January 30, 2015.  Please call, return number is [phone]."  Called patient.  Orthopedic surgeon Dr. Wynelle Link has provided dates.  "August 30 and December 14, 2014 are next F/U visits and Dr. Wynelle Link.  Surgery will be December 20, 2014.  I'll see Dr. Marlou Starks who did my breast surgery on December 12, 2014  Having breast cancer history with upcoming oncology tests I'm a little concerned if I'll need more breast surgery.  This will be my second hip surgery.  I want to take care of breast first if more work is needed."

## 2014-11-22 NOTE — Telephone Encounter (Signed)
This RN spoke with pt -   Discussed her concerns - pt denies any issues related to her breast at this time. Reviewed mammogram per June 2016 showing surgical changes only - which pt understands to be her " new normal".  Per phone call pt understands per this office no contraindication for hip surgery.  Pt verbalized appreciation and will proceed as scheduled.

## 2014-11-23 DIAGNOSIS — N6489 Other specified disorders of breast: Secondary | ICD-10-CM | POA: Diagnosis not present

## 2014-11-24 ENCOUNTER — Ambulatory Visit: Payer: Self-pay | Admitting: Orthopedic Surgery

## 2014-11-24 NOTE — H&P (Signed)
Shannon Grant DOB: Dec 23, 1945 Divorced / Language: Cleophus Molt / Race: White Female Date of Admission:  12/20/2014 CC:  Left Hip Pain History of Present Illness The patient is a 69 year old female who comes in for a preoperative History and Physical. The patient is scheduled for a left total hip arthroplasty (anterior) to be performed by Dr. Dione Plover. Aluisio, MD at [facility] on 12/20/2014. The patient is a 69 year old female who presented for follow up of their hip. The patient is being followed for their left hip pain and osteoarthritis. They are over 2 year(s) out from IA injection w/ Dr. Nelva Bush which did not provide any relief of symptoms. Symptoms reported include: pain (constant, that increases with exercise), aching, stiffness, giving way, instability, pain with weightbearing, difficulty ambulating (and going up and down stairs) and difficulty arising from chair. The patient feels that they are doing poorly and report their pain level to be moderate to severe (upon flexion and ambulation). Current treatment includes: activity modification. The following medication has been used for pain control: antiinflammatory medication (Ibuprofen).  The patient has reported not much improvement of their symptoms with: activity modification and heat. Unfortunately, her left hip is getting progressively worse over time. She had documented bone-on-bone arthritis a few years ago, had intraarticular injection with Dr. Nelva Bush, which helped for a short amount of time. She had other health issues since then and is now at a stage where she can have the hip addressed. She has had her right hip replaced and did great with that. The left hip is now at a stage where she feels like it needs to be fixed because of the extent of pain she is having. She is ready to get the hip replaced at this time. They have been treated conservatively in the past for the above stated problem and despite conservative measures, they continue  to have progressive pain and severe functional limitations and dysfunction. They have failed non-operative management including home exercise, medications, and injections. It is felt that they would benefit from undergoing total joint replacement. Risks and benefits of the procedure have been discussed with the patient and they elect to proceed with surgery. There are no active contraindications to surgery such as ongoing infection or rapidly progressive neurological disease.  Problem List/Past Medical  Cervical radiculopathy, acute (M54.12) Primary osteoarthritis of left hip (M16.12) Trochanteric bursitis, right hip (M70.61) Osteoarthritis of both knees (M17.0) High blood pressure Hypercholesterolemia Osteoarthritis Hypothyroidism Breast cancer Right-sided  Allergies Codeine Derivatives Hives, Nausea, Vomiting. Cipro *Fluoroquinolones**  Family History Depression mother Congestive Heart Failure First Degree Relatives. father Heart Disease father Drug / Alcohol Addiction father Kidney disease grandfather mothers side Osteoarthritis grandmother fathers side Hypertension father  Social History Pain Contract no Current work status working full time Living situation live alone Children 2 Previously in rehab no Tobacco / smoke exposure no Most recent primary occupation Pharmacist, hospital Number of flights of stairs before winded 2-3 Marital status divorced Illicit drug use no Tobacco use Former smoker. former smoker; smoke(d) 2 1/2 pack(s) per day Exercise Exercises weekly; does running / walking Drug/Alcohol Rehab (Currently) no Alcohol use current drinker; drinks wine; 5-7 per week  Medication History TraMADol HCl (50MG  Tablet, 1 (one) Tablet Oral po qhs prn, Taken starting 11/20/2014) Active. Lisinopril (10MG  Tablet, Oral) Active. Pravastatin Sodium (10MG  Tablet, Oral) Active. Hydrochlorothiazide (25MG  Tablet, Oral) Active. Levothyroxine Sodium  (100MCG Tablet, Oral) Active. Advil (Oral) Specific dose unknown - Active. (prn) Multivitamin Adult (Oral) Active. CO-Q  10 Omega-3 Fish Oil (Oral) Active. Vitamin D (Oral) Specific dose unknown - Active.  Past Surgical History Dilation and Curettage of Uterus Cesarean Delivery 1 time Thyroidectomy; Subtotal Tonsillectomy Adnoids Total Hip Replacement right Sinus Surgery Tubal Ligation Left knee surgery at age 81 prior to arthroscopic procedures  Review of Systems General Not Present- Chills, Fatigue, Fever, Memory Loss, Night Sweats, Weight Gain and Weight Loss. Skin Not Present- Eczema, Hives, Itching, Lesions and Rash. HEENT Not Present- Dentures, Double Vision, Headache, Hearing Loss, Tinnitus and Visual Loss. Respiratory Not Present- Allergies, Chronic Cough, Coughing up blood, Shortness of breath at rest and Shortness of breath with exertion. Cardiovascular Not Present- Chest Pain, Difficulty Breathing Lying Down, Murmur, Palpitations, Racing/skipping heartbeats and Swelling. Gastrointestinal Not Present- Abdominal Pain, Bloody Stool, Constipation, Diarrhea, Difficulty Swallowing, Heartburn, Jaundice, Loss of appetitie, Nausea and Vomiting. Female Genitourinary Not Present- Blood in Urine, Discharge, Flank Pain, Incontinence, Painful Urination, Urgency, Urinary frequency, Urinary Retention, Urinating at Night and Weak urinary stream. Musculoskeletal Present- Joint Pain (left hip and left shoulder). Not Present- Back Pain, Joint Swelling, Morning Stiffness, Muscle Pain, Muscle Weakness and Spasms. Neurological Not Present- Blackout spells, Difficulty with balance, Dizziness, Paralysis, Tremor and Weakness. Psychiatric Not Present- Insomnia.  Vitals Weight: 64 lb Height: 150in Height was reported by patient. Body Surface Area: 2.23 m Body Mass Index: 2 kg/m  BP: 172/82 (Sitting, Right Arm, Standard)  Physical Exam  General Mental Status -Alert, cooperative  and good historian. General Appearance-pleasant, Not in acute distress. Orientation-Oriented X3. Build & Nutrition-Well nourished and Well developed.  Head and Neck Head-normocephalic, atraumatic . Neck Global Assessment - supple, no bruit auscultated on the right, no bruit auscultated on the left.  Eye Vision-Wears corrective lenses. Pupil - Bilateral-Regular and Round. Motion - Bilateral-EOMI.  Chest and Lung Exam Auscultation Breath sounds - clear at anterior chest wall and clear at posterior chest wall. Adventitious sounds - No Adventitious sounds.  Cardiovascular Auscultation Rhythm - Regular rate and rhythm. Heart Sounds - S1 WNL and S2 WNL. Murmurs & Other Heart Sounds - Auscultation of the heart reveals - No Murmurs.  Abdomen Palpation/Percussion Tenderness - Abdomen is non-tender to palpation. Rigidity (guarding) - Abdomen is soft. Auscultation Auscultation of the abdomen reveals - Bowel sounds normal.  Female Genitourinary Note: Not done, not pertinent to present illness   Musculoskeletal Note: On exam, she is alert and oriented, in no apparent distress. Her right hip can be flexed to 120, rotate in 30 and out 40, abduct 10 without discomfort. The left hip flexion to about 90, no internal rotation, about 10 external rotation, and 10 to 20 of abduction. Left knee exam is unremarkable.  RADIOGRAPHS AP pelvis and AP and lateral of left hip show the prosthesis on her right is in excellent position with no periprosthetic abnormalities. On the left, she has bone-on-bone arthritis.   Assessment & Plan Primary osteoarthritis of left hip (M16.12) Note:Surgical Plans: Left Total Hip Replacement - Anterior Approach  Disposition: Home  PCP: Dr. Justin Mend  Topical TXA - History of Breast Cancer  Anesthesia Issues: None  Signed electronically by Joelene Millin, III PA-C

## 2014-11-24 NOTE — Progress Notes (Signed)
Preoperative surgical orders have been place into the Epic hospital system for Shannon Grant on 11/24/2014, 4:18 PM  by Mickel Crow for surgery on 12/20/2014.  Preop Total Hip - Anterior Approach orders including IV Tylenol, and IV Decadron as long as there are no contraindications to the above medications. Arlee Muslim, PA-C

## 2014-11-28 DIAGNOSIS — M1612 Unilateral primary osteoarthritis, left hip: Secondary | ICD-10-CM | POA: Diagnosis not present

## 2014-11-30 DIAGNOSIS — M5412 Radiculopathy, cervical region: Secondary | ICD-10-CM | POA: Diagnosis not present

## 2014-12-01 DIAGNOSIS — E039 Hypothyroidism, unspecified: Secondary | ICD-10-CM | POA: Diagnosis not present

## 2014-12-01 DIAGNOSIS — M1990 Unspecified osteoarthritis, unspecified site: Secondary | ICD-10-CM | POA: Diagnosis not present

## 2014-12-01 DIAGNOSIS — Z01818 Encounter for other preprocedural examination: Secondary | ICD-10-CM | POA: Diagnosis not present

## 2014-12-01 DIAGNOSIS — I10 Essential (primary) hypertension: Secondary | ICD-10-CM | POA: Diagnosis not present

## 2014-12-05 ENCOUNTER — Ambulatory Visit: Payer: Self-pay | Admitting: Orthopedic Surgery

## 2014-12-07 DIAGNOSIS — M5412 Radiculopathy, cervical region: Secondary | ICD-10-CM | POA: Diagnosis not present

## 2014-12-11 DIAGNOSIS — M5412 Radiculopathy, cervical region: Secondary | ICD-10-CM | POA: Diagnosis not present

## 2014-12-13 ENCOUNTER — Encounter (HOSPITAL_COMMUNITY)
Admission: RE | Admit: 2014-12-13 | Discharge: 2014-12-13 | Disposition: A | Discharge status: Home / Self Care | Payer: Medicare Other | Source: Ambulatory Visit | Attending: Orthopedic Surgery | Admitting: Orthopedic Surgery

## 2014-12-13 ENCOUNTER — Encounter (HOSPITAL_COMMUNITY): Payer: Self-pay

## 2014-12-13 DIAGNOSIS — M1612 Unilateral primary osteoarthritis, left hip: Secondary | ICD-10-CM | POA: Insufficient documentation

## 2014-12-13 DIAGNOSIS — Z01818 Encounter for other preprocedural examination: Secondary | ICD-10-CM | POA: Diagnosis not present

## 2014-12-13 HISTORY — DX: Personal history of antineoplastic chemotherapy: Z92.21

## 2014-12-13 HISTORY — DX: Hypothyroidism, unspecified: E03.9

## 2014-12-13 HISTORY — DX: Pain, unspecified: R52

## 2014-12-13 HISTORY — DX: Personal history of other diseases of the digestive system: Z87.19

## 2014-12-13 HISTORY — DX: Trochanteric bursitis, right hip: M70.61

## 2014-12-13 HISTORY — DX: Personal history of urinary (tract) infections: Z87.440

## 2014-12-13 HISTORY — DX: Personal history of other medical treatment: Z92.89

## 2014-12-13 HISTORY — DX: Radiculopathy, cervical region: M54.12

## 2014-12-13 LAB — COMPREHENSIVE METABOLIC PANEL
ALK PHOS: 52 U/L (ref 38–126)
ALT: 21 U/L (ref 14–54)
ANION GAP: 9 (ref 5–15)
AST: 27 U/L (ref 15–41)
Albumin: 4.9 g/dL (ref 3.5–5.0)
BUN: 12 mg/dL (ref 6–20)
CO2: 28 mmol/L (ref 22–32)
Calcium: 10.4 mg/dL — ABNORMAL HIGH (ref 8.9–10.3)
Chloride: 103 mmol/L (ref 101–111)
Creatinine, Ser: 0.65 mg/dL (ref 0.44–1.00)
Glucose, Bld: 100 mg/dL — ABNORMAL HIGH (ref 65–99)
Potassium: 4.3 mmol/L (ref 3.5–5.1)
SODIUM: 140 mmol/L (ref 135–145)
Total Bilirubin: 0.8 mg/dL (ref 0.3–1.2)
Total Protein: 7.1 g/dL (ref 6.5–8.1)

## 2014-12-13 LAB — CBC
HCT: 41.9 % (ref 36.0–46.0)
HEMOGLOBIN: 14.1 g/dL (ref 12.0–15.0)
MCH: 32 pg (ref 26.0–34.0)
MCHC: 33.7 g/dL (ref 30.0–36.0)
MCV: 95.2 fL (ref 78.0–100.0)
PLATELETS: 214 10*3/uL (ref 150–400)
RBC: 4.4 MIL/uL (ref 3.87–5.11)
RDW: 13.3 % (ref 11.5–15.5)
WBC: 5.5 10*3/uL (ref 4.0–10.5)

## 2014-12-13 LAB — PROTIME-INR
INR: 0.99 (ref 0.00–1.49)
PROTHROMBIN TIME: 13.3 s (ref 11.6–15.2)

## 2014-12-13 LAB — URINALYSIS, ROUTINE W REFLEX MICROSCOPIC
GLUCOSE, UA: NEGATIVE mg/dL
HGB URINE DIPSTICK: NEGATIVE
Ketones, ur: NEGATIVE mg/dL
LEUKOCYTES UA: NEGATIVE
Nitrite: NEGATIVE
Protein, ur: NEGATIVE mg/dL
SPECIFIC GRAVITY, URINE: 1.018 (ref 1.005–1.030)
Urobilinogen, UA: 0.2 mg/dL (ref 0.0–1.0)
pH: 6 (ref 5.0–8.0)

## 2014-12-13 LAB — SURGICAL PCR SCREEN
MRSA, PCR: NEGATIVE
STAPHYLOCOCCUS AUREUS: NEGATIVE

## 2014-12-13 LAB — APTT: aPTT: 29 seconds (ref 24–37)

## 2014-12-13 NOTE — Progress Notes (Signed)
Clearance note per Dr Justin Mend on chart 12/01/2014

## 2014-12-13 NOTE — Patient Instructions (Signed)
Shannon Grant  12/13/2014   Your procedure is scheduled on: Wednesday December 20, 2014   Report to [facility] Main  Entrance take [place]  elevators to 3rd floor to  [place] at 1:20 PM.  Call this number if you have problems the morning of surgery [phone]   Remember: ONLY 1 PERSON MAY GO WITH YOU TO SHORT STAY TO GET  READY MORNING OF [place].  Do not eat food After Midnight but may take clear liquids till 9:20 am day of surgery then nothing by mouth.      Take these medicines the morning of surgery with A SIP OF WATER: Levothyroxine                                You may not have any metal on your body including hair pins and              piercings  Do not wear jewelry, make-up, lotions, powders or perfumes, deodorant             Do not wear nail polish.  Do not shave  48 hours prior to surgery.               Do not bring valuables to the hospital. [place].  Contacts, dentures or bridgework may not be worn into surgery.  Leave suitcase in the car. After surgery it may be brought to your room.   Please read over the following fact sheets you were given:MRSA INFORMATION SHEET; INCENTIVE SPIROMETER; BLOOD TRANSFUSION INFORMATION SHEET _____________________________________________________________________             [facility] - Preparing for Surgery Before surgery, you can play an important role.  Because skin is not sterile, your skin needs to be as free of germs as possible.  You can reduce the number of germs on your skin by washing with CHG (chlorahexidine gluconate) soap before surgery.  CHG is an antiseptic cleaner which kills germs and bonds with the skin to continue killing germs even after washing. Please DO NOT use if you have an allergy to CHG or antibacterial soaps.  If your skin becomes reddened/irritated stop using the CHG and inform your nurse when you arrive at Short Stay. Do  not shave (including legs and underarms) for at least 48 hours prior to the first CHG shower.  You may shave your face/neck. Please follow these instructions carefully:  1.  Shower with CHG Soap the night before surgery and the  morning of Surgery.  2.  If you choose to wash your hair, wash your hair first as usual with your  normal  shampoo.  3.  After you shampoo, rinse your hair and body thoroughly to remove the  shampoo.                           4.  Use CHG as you would any other liquid soap.  You can apply chg directly  to the skin and wash                       Gently with a scrungie or clean washcloth.  5.  Apply the CHG  Soap to your body ONLY FROM THE NECK DOWN.   Do not use on face/ open                           Wound or open sores. Avoid contact with eyes, ears mouth and genitals (private parts).                       Wash face,  Genitals (private parts) with your normal soap.             6.  Wash thoroughly, paying special attention to the area where your surgery  will be performed.  7.  Thoroughly rinse your body with warm water from the neck down.  8.  DO NOT shower/wash with your normal soap after using and rinsing off  the CHG Soap.                9.  Pat yourself dry with a clean towel.            10.  Wear clean pajamas.            11.  Place clean sheets on your bed the night of your first shower and do not  sleep with pets. Day of Surgery : Do not apply any lotions/deodorants the morning of surgery.  Please wear clean clothes to the hospital/surgery center.  FAILURE TO FOLLOW THESE INSTRUCTIONS MAY RESULT IN THE CANCELLATION OF YOUR SURGERY PATIENT SIGNATURE_________________________________  NURSE SIGNATURE__________________________________  ________________________________________________________________________   Adam Phenix  An incentive spirometer is a tool that can help keep your lungs clear and active. This tool measures how well you are filling your  lungs with each breath. Taking long deep breaths may help reverse or decrease the chance of developing breathing (pulmonary) problems (especially infection) following:  A long period of time when you are unable to move or be active. BEFORE THE PROCEDURE   If the spirometer includes an indicator to show your best effort, your nurse or respiratory therapist will set it to a desired goal.  If possible, sit up straight or lean slightly forward. Try not to slouch.  Hold the incentive spirometer in an upright position. INSTRUCTIONS FOR USE   Sit on the edge of your bed if possible, or sit up as far as you can in bed or on a chair.  Hold the incentive spirometer in an upright position.  Breathe out normally.  Place the mouthpiece in your mouth and seal your lips tightly around it.  Breathe in slowly and as deeply as possible, raising the piston or the ball toward the top of the column.  Hold your breath for 3-5 seconds or for as long as possible. Allow the piston or ball to fall to the bottom of the column.  Remove the mouthpiece from your mouth and breathe out normally.  Rest for a few seconds and repeat Steps 1 through 7 at least 10 times every 1-2 hours when you are awake. Take your time and take a few normal breaths between deep breaths.  The spirometer may include an indicator to show your best effort. Use the indicator as a goal to work toward during each repetition.  After each set of 10 deep breaths, practice coughing to be sure your lungs are clear. If you have an incision (the cut made at the time of surgery), support your incision when coughing by placing a pillow or rolled up towels  firmly against it. Once you are able to get out of bed, walk around indoors and cough well. You may stop using the incentive spirometer when instructed by your caregiver.  RISKS AND COMPLICATIONS  Take your time so you do not get dizzy or light-headed.  If you are in pain, you may need to take or  ask for pain medication before doing incentive spirometry. It is harder to take a deep breath if you are having pain. AFTER USE  Rest and breathe slowly and easily.  It can be helpful to keep track of a log of your progress. Your caregiver can provide you with a simple table to help with this. If you are using the spirometer at home, follow these instructions: [place] IF:   You are having difficultly using the spirometer.  You have trouble using the spirometer as often as instructed.  Your pain medication is not giving enough relief while using the spirometer.  You develop fever of 100.5 F (38.1 C) or higher. SEEK IMMEDIATE MEDICAL CARE IF:   You cough up bloody sputum that had not been present before.  You develop fever of 102 F (38.9 C) or greater.  You develop worsening pain at or near the incision site. MAKE SURE YOU:   Understand these instructions.  Will watch your condition.  Will get help right away if you are not doing well or get worse. Document Released: 07/28/2006 Document Revised: 06/09/2011 Document Reviewed: 09/28/2006 ExitCare Patient Information 2014 ExitCare, [place].   ________________________________________________________________________  WHAT IS A BLOOD TRANSFUSION? Blood Transfusion Information  A transfusion is the replacement of blood or some of its parts. Blood is made up of multiple cells which provide different functions.  Red blood cells carry oxygen and are used for blood loss replacement.  White blood cells fight against infection.  Platelets control bleeding.  Plasma helps clot blood.  Other blood products are available for specialized needs, such as hemophilia or other clotting disorders. BEFORE THE TRANSFUSION  Who gives blood for transfusions?   Healthy volunteers who are fully evaluated to make sure their blood is safe. This is blood bank blood. Transfusion therapy is the safest it has ever been in the practice of  medicine. Before blood is taken from a donor, a complete history is taken to make sure that person has no history of diseases nor engages in risky social behavior (examples are intravenous drug use or sexual activity with multiple partners). The donor's travel history is screened to minimize risk of transmitting infections, such as malaria. The donated blood is tested for signs of infectious diseases, such as HIV and hepatitis. The blood is then tested to be sure it is compatible with you in order to minimize the chance of a transfusion reaction. If you or a relative donates blood, this is often done in anticipation of surgery and is not appropriate for emergency situations. It takes many days to process the donated blood. RISKS AND COMPLICATIONS Although transfusion therapy is very safe and saves many lives, the main dangers of transfusion include:   Getting an infectious disease.  Developing a transfusion reaction. This is an allergic reaction to something in the blood you were given. Every precaution is taken to prevent this. The decision to have a blood transfusion has been considered carefully by your caregiver before blood is given. Blood is not given unless the benefits outweigh the risks. AFTER THE TRANSFUSION  Right after receiving a blood transfusion, you will usually feel much better  and more energetic. This is especially true if your red blood cells have gotten low (anemic). The transfusion raises the level of the red blood cells which carry oxygen, and this usually causes an energy increase.  The nurse administering the transfusion will monitor you carefully for complications. HOME CARE INSTRUCTIONS  No special instructions are needed after a transfusion. You may find your energy is better. Speak with your caregiver about any limitations on activity for underlying diseases you may have. SEEK MEDICAL CARE IF:   Your condition is not improving after your transfusion.  You develop  redness or irritation at the intravenous (IV) site. SEEK IMMEDIATE MEDICAL CARE IF:  Any of the following symptoms occur over the next 12 hours:  Shaking chills.  You have a temperature by mouth above 102 F (38.9 C), not controlled by medicine.  Chest, back, or muscle pain.  People around you feel you are not acting correctly or are confused.  Shortness of breath or difficulty breathing.  Dizziness and fainting.  You get a rash or develop hives.  You have a decrease in urine output.  Your urine turns a dark color or changes to pink, red, or brown. Any of the following symptoms occur over the next 10 days:  You have a temperature by mouth above 102 F (38.9 C), not controlled by medicine.  Shortness of breath.  Weakness after normal activity.  The white part of the eye turns yellow (jaundice).  You have a decrease in the amount of urine or are urinating less often.  Your urine turns a dark color or changes to pink, red, or brown. Document Released: 03/14/2000 Document Revised: 06/09/2011 Document Reviewed: 11/01/2007 ExitCare Patient Information 2014 ExitCare, [place].  _______________________________________________________________________   CLEAR LIQUID DIET   Foods Allowed                                                                     Foods Excluded  Coffee and tea, regular and decaf                             liquids that you cannot  Plain Jell-O in any flavor                                             see through such as: Fruit ices (not with fruit pulp)                                     milk, soups, orange juice  Iced Popsicles                                    All solid food Carbonated beverages, regular and diet                                    Cranberry, grape and apple juices  Sports drinks like Gatorade Lightly seasoned clear broth or consume(fat free) Sugar, honey syrup  Sample Menu Breakfast                                Lunch                                      Supper Cranberry juice                    Beef broth                            Chicken broth Jell-O                                     Grape juice                           Apple juice Coffee or tea                        Jell-O                                      Popsicle                                                Coffee or tea                        Coffee or tea  _____________________________________________________________________

## 2014-12-19 NOTE — H&P (Signed)
Shannon Grant DOB: 09/28/1945 Divorced / Language: English / Race: White Female Date of Admission:  12/20/2014 CC:  Left Hip Pain History of Present Illness The patient is a 69 year old female who comes in for a preoperative History and Physical. The patient is scheduled for a left total hip arthroplasty (anterior) to be performed by Dr. Frank V. Aluisio, MD at [facility] on 12/20/2014. The patient is a 69 year old female who presented for follow up of their hip. The patient is being followed for their left hip pain and osteoarthritis. They are over 2 year(s) out from IA injection w/ Dr. Ramos which did not provide any relief of symptoms. Symptoms reported include: pain (constant, that increases with exercise), aching, stiffness, giving way, instability, pain with weightbearing, difficulty ambulating (and going up and down stairs) and difficulty arising from chair. The patient feels that they are doing poorly and report their pain level to be moderate to severe (upon flexion and ambulation). Current treatment includes: activity modification. The following medication has been used for pain control: antiinflammatory medication (Ibuprofen).  The patient has reported not much improvement of their symptoms with: activity modification and heat. Unfortunately, her left hip is getting progressively worse over time. She had documented bone-on-bone arthritis a few years ago, had intraarticular injection with Dr. Ramos, which helped for a short amount of time. She had other health issues since then and is now at a stage where she can have the hip addressed. She has had her right hip replaced and did great with that. The left hip is now at a stage where she feels like it needs to be fixed because of the extent of pain she is having. She is ready to get the hip replaced at this time. They have been treated conservatively in the past for the above stated problem and despite conservative measures, they continue  to have progressive pain and severe functional limitations and dysfunction. They have failed non-operative management including home exercise, medications, and injections. It is felt that they would benefit from undergoing total joint replacement. Risks and benefits of the procedure have been discussed with the patient and they elect to proceed with surgery. There are no active contraindications to surgery such as ongoing infection or rapidly progressive neurological disease.  Problem List/Past Medical  Cervical radiculopathy, acute (M54.12) Primary osteoarthritis of left hip (M16.12) Trochanteric bursitis, right hip (M70.61) Osteoarthritis of both knees (M17.0) High blood pressure Hypercholesterolemia Osteoarthritis Hypothyroidism Breast cancer Right-sided  Allergies Codeine Derivatives Hives, Nausea, Vomiting. Cipro *Fluoroquinolones**  Family History Depression mother Congestive Heart Failure First Degree Relatives. father Heart Disease father Drug / Alcohol Addiction father Kidney disease grandfather mothers side Osteoarthritis grandmother fathers side Hypertension father  Social History Pain Contract no Current work status working full time Living situation live alone Children 2 Previously in rehab no Tobacco / smoke exposure no Most recent primary occupation Teacher Number of flights of stairs before winded 2-3 Marital status divorced Illicit drug use no Tobacco use Former smoker. former smoker; smoke(d) 2 1/2 pack(s) per day Exercise Exercises weekly; does running / walking Drug/Alcohol Rehab (Currently) no Alcohol use current drinker; drinks wine; 5-7 per week  Medication History TraMADol HCl (50MG Tablet, 1 (one) Tablet Oral po qhs prn, Taken starting 11/20/2014) Active. Lisinopril (10MG Tablet, Oral) Active. Pravastatin Sodium (10MG Tablet, Oral) Active. Hydrochlorothiazide (25MG Tablet, Oral) Active. Levothyroxine Sodium  (100MCG Tablet, Oral) Active. Advil (Oral) Specific dose unknown - Active. (prn) Multivitamin Adult (Oral) Active. CO-Q   10 Omega-3 Fish Oil (Oral) Active. Vitamin D (Oral) Specific dose unknown - Active.  Past Surgical History Dilation and Curettage of Uterus Cesarean Delivery 1 time Thyroidectomy; Subtotal Tonsillectomy Adnoids Total Hip Replacement right Sinus Surgery Tubal Ligation Left knee surgery at age 15 prior to arthroscopic procedures  Review of Systems General Not Present- Chills, Fatigue, Fever, Memory Loss, Night Sweats, Weight Gain and Weight Loss. Skin Not Present- Eczema, Hives, Itching, Lesions and Rash. HEENT Not Present- Dentures, Double Vision, Headache, Hearing Loss, Tinnitus and Visual Loss. Respiratory Not Present- Allergies, Chronic Cough, Coughing up blood, Shortness of breath at rest and Shortness of breath with exertion. Cardiovascular Not Present- Chest Pain, Difficulty Breathing Lying Down, Murmur, Palpitations, Racing/skipping heartbeats and Swelling. Gastrointestinal Not Present- Abdominal Pain, Bloody Stool, Constipation, Diarrhea, Difficulty Swallowing, Heartburn, Jaundice, Loss of appetitie, Nausea and Vomiting. Female Genitourinary Not Present- Blood in Urine, Discharge, Flank Pain, Incontinence, Painful Urination, Urgency, Urinary frequency, Urinary Retention, Urinating at Night and Weak urinary stream. Musculoskeletal Present- Joint Pain (left hip and left shoulder). Not Present- Back Pain, Joint Swelling, Morning Stiffness, Muscle Pain, Muscle Weakness and Spasms. Neurological Not Present- Blackout spells, Difficulty with balance, Dizziness, Paralysis, Tremor and Weakness. Psychiatric Not Present- Insomnia.  Vitals Weight: 64 lb Height: 150in Height was reported by patient. Body Surface Area: 2.23 m Body Mass Index: 2 kg/m  BP: 172/82 (Sitting, Right Arm, Standard)  Physical Exam  General Mental Status -Alert, cooperative  and good historian. General Appearance-pleasant, Not in acute distress. Orientation-Oriented X3. Build & Nutrition-Well nourished and Well developed.  Head and Neck Head-normocephalic, atraumatic . Neck Global Assessment - supple, no bruit auscultated on the right, no bruit auscultated on the left.  Eye Vision-Wears corrective lenses. Pupil - Bilateral-Regular and Round. Motion - Bilateral-EOMI.  Chest and Lung Exam Auscultation Breath sounds - clear at anterior chest wall and clear at posterior chest wall. Adventitious sounds - No Adventitious sounds.  Cardiovascular Auscultation Rhythm - Regular rate and rhythm. Heart Sounds - S1 WNL and S2 WNL. Murmurs & Other Heart Sounds - Auscultation of the heart reveals - No Murmurs.  Abdomen Palpation/Percussion Tenderness - Abdomen is non-tender to palpation. Rigidity (guarding) - Abdomen is soft. Auscultation Auscultation of the abdomen reveals - Bowel sounds normal.  Female Genitourinary Note: Not done, not pertinent to present illness   Musculoskeletal Note: On exam, she is alert and oriented, in no apparent distress. Her right hip can be flexed to 120, rotate in 30 and out 40, abduct 10 without discomfort. The left hip flexion to about 90, no internal rotation, about 10 external rotation, and 10 to 20 of abduction. Left knee exam is unremarkable.  RADIOGRAPHS AP pelvis and AP and lateral of left hip show the prosthesis on her right is in excellent position with no periprosthetic abnormalities. On the left, she has bone-on-bone arthritis.   Assessment & Plan Primary osteoarthritis of left hip (M16.12) Note:Surgical Plans: Left Total Hip Replacement - Anterior Approach  Disposition: Home  PCP: Dr. Webb  Topical TXA - History of Breast Cancer  Anesthesia Issues: None  Signed electronically by Alexzandrew L Perkins, III PA-C 

## 2014-12-20 ENCOUNTER — Encounter (HOSPITAL_COMMUNITY): Payer: Self-pay

## 2014-12-20 ENCOUNTER — Encounter (HOSPITAL_COMMUNITY)
Admission: RE | Disposition: A | Discharge status: Home / Self Care | Payer: Self-pay | Source: Ambulatory Visit | Attending: Orthopedic Surgery

## 2014-12-20 ENCOUNTER — Inpatient Hospital Stay (HOSPITAL_COMMUNITY)
Admission: RE | Admit: 2014-12-20 | Discharge: 2014-12-22 | DRG: 470 | Disposition: A | Discharge status: Home / Self Care | Payer: Medicare Other | Source: Ambulatory Visit | Attending: Orthopedic Surgery | Admitting: Orthopedic Surgery

## 2014-12-20 ENCOUNTER — Inpatient Hospital Stay (HOSPITAL_COMMUNITY): Payer: Medicare Other

## 2014-12-20 ENCOUNTER — Inpatient Hospital Stay (HOSPITAL_COMMUNITY): Payer: Medicare Other | Admitting: Registered Nurse

## 2014-12-20 DIAGNOSIS — Z01812 Encounter for preprocedural laboratory examination: Secondary | ICD-10-CM

## 2014-12-20 DIAGNOSIS — R110 Nausea: Secondary | ICD-10-CM | POA: Diagnosis not present

## 2014-12-20 DIAGNOSIS — Z87440 Personal history of urinary (tract) infections: Secondary | ICD-10-CM | POA: Diagnosis not present

## 2014-12-20 DIAGNOSIS — Z9221 Personal history of antineoplastic chemotherapy: Secondary | ICD-10-CM | POA: Diagnosis not present

## 2014-12-20 DIAGNOSIS — M5412 Radiculopathy, cervical region: Secondary | ICD-10-CM | POA: Diagnosis present

## 2014-12-20 DIAGNOSIS — Z96642 Presence of left artificial hip joint: Secondary | ICD-10-CM | POA: Diagnosis not present

## 2014-12-20 DIAGNOSIS — M169 Osteoarthritis of hip, unspecified: Secondary | ICD-10-CM | POA: Diagnosis present

## 2014-12-20 DIAGNOSIS — M1612 Unilateral primary osteoarthritis, left hip: Secondary | ICD-10-CM | POA: Diagnosis not present

## 2014-12-20 DIAGNOSIS — Z87891 Personal history of nicotine dependence: Secondary | ICD-10-CM | POA: Diagnosis not present

## 2014-12-20 DIAGNOSIS — Z79899 Other long term (current) drug therapy: Secondary | ICD-10-CM

## 2014-12-20 DIAGNOSIS — Z923 Personal history of irradiation: Secondary | ICD-10-CM

## 2014-12-20 DIAGNOSIS — Z96649 Presence of unspecified artificial hip joint: Secondary | ICD-10-CM

## 2014-12-20 DIAGNOSIS — I10 Essential (primary) hypertension: Secondary | ICD-10-CM | POA: Diagnosis present

## 2014-12-20 DIAGNOSIS — Z853 Personal history of malignant neoplasm of breast: Secondary | ICD-10-CM | POA: Diagnosis not present

## 2014-12-20 DIAGNOSIS — E039 Hypothyroidism, unspecified: Secondary | ICD-10-CM | POA: Diagnosis present

## 2014-12-20 DIAGNOSIS — Z96641 Presence of right artificial hip joint: Secondary | ICD-10-CM | POA: Diagnosis present

## 2014-12-20 DIAGNOSIS — M25552 Pain in left hip: Secondary | ICD-10-CM | POA: Diagnosis not present

## 2014-12-20 HISTORY — PX: TOTAL HIP ARTHROPLASTY: SHX124

## 2014-12-20 LAB — TYPE AND SCREEN
ABO/RH(D): O POS
Antibody Screen: NEGATIVE

## 2014-12-20 SURGERY — ARTHROPLASTY, HIP, TOTAL, ANTERIOR APPROACH
Anesthesia: Spinal | Site: Hip | Laterality: Left

## 2014-12-20 MED ORDER — CEFAZOLIN SODIUM-DEXTROSE 2-3 GM-% IV SOLR
INTRAVENOUS | Status: AC
  Filled 2014-12-20: qty 50

## 2014-12-20 MED ORDER — SODIUM CHLORIDE 0.9 % IV SOLN
INTRAVENOUS | Status: DC
  Administered 2014-12-21: 10:00:00 via INTRAVENOUS

## 2014-12-20 MED ORDER — PHENOL 1.4 % MT LIQD
1.0000 | OROMUCOSAL | Status: DC | PRN
  Filled 2014-12-20: qty 177

## 2014-12-20 MED ORDER — MEPERIDINE HCL 50 MG/ML IJ SOLN: 6.2500 mg | INTRAMUSCULAR | Status: DC | PRN

## 2014-12-20 MED ORDER — DEXAMETHASONE SODIUM PHOSPHATE 10 MG/ML IJ SOLN
INTRAMUSCULAR | Status: AC
  Filled 2014-12-20: qty 1

## 2014-12-20 MED ORDER — SODIUM CHLORIDE 0.9 % IV SOLN
INTRAVENOUS | Status: DC
Start: 2014-12-20 — End: 2014-12-20

## 2014-12-20 MED ORDER — 0.9 % SODIUM CHLORIDE (POUR BTL) OPTIME
TOPICAL | Status: DC | PRN
  Administered 2014-12-20: 1000 mL

## 2014-12-20 MED ORDER — PROMETHAZINE HCL 25 MG/ML IJ SOLN: 6.2500 mg | INTRAMUSCULAR | Status: DC | PRN

## 2014-12-20 MED ORDER — ONDANSETRON HCL 4 MG/2ML IJ SOLN
INTRAMUSCULAR | Status: DC | PRN
  Administered 2014-12-20: 4 mg via INTRAVENOUS

## 2014-12-20 MED ORDER — PROPOFOL INFUSION 10 MG/ML OPTIME
INTRAVENOUS | Status: DC | PRN
  Administered 2014-12-20: 50 ug/kg/min via INTRAVENOUS

## 2014-12-20 MED ORDER — MIDAZOLAM HCL 5 MG/5ML IJ SOLN
INTRAMUSCULAR | Status: DC | PRN
  Administered 2014-12-20: 2 mg via INTRAVENOUS

## 2014-12-20 MED ORDER — PROPOFOL 10 MG/ML IV BOLUS
INTRAVENOUS | Status: AC
  Filled 2014-12-20: qty 20

## 2014-12-20 MED ORDER — METOCLOPRAMIDE HCL 5 MG/ML IJ SOLN: 5.0000 mg | Freq: Three times a day (TID) | INTRAMUSCULAR | Status: DC | PRN

## 2014-12-20 MED ORDER — FENTANYL CITRATE (PF) 100 MCG/2ML IJ SOLN: 25.0000 ug | INTRAMUSCULAR | Status: DC | PRN

## 2014-12-20 MED ORDER — LIDOCAINE HCL (CARDIAC) 20 MG/ML IV SOLN
INTRAVENOUS | Status: AC
  Filled 2014-12-20: qty 5

## 2014-12-20 MED ORDER — METOCLOPRAMIDE HCL 10 MG PO TABS
5.0000 mg | ORAL_TABLET | Freq: Three times a day (TID) | ORAL | Status: DC | PRN
  Administered 2014-12-21: 10 mg via ORAL
  Filled 2014-12-20: qty 1

## 2014-12-20 MED ORDER — FLEET ENEMA 7-19 GM/118ML RE ENEM: 1.0000 | ENEMA | Freq: Once | RECTAL | Status: DC | PRN

## 2014-12-20 MED ORDER — CEFAZOLIN SODIUM-DEXTROSE 2-3 GM-% IV SOLR
2.0000 g | Freq: Four times a day (QID) | INTRAVENOUS | Status: AC
  Administered 2014-12-20 – 2014-12-21 (×2): 2 g via INTRAVENOUS
  Filled 2014-12-20 (×3): qty 50

## 2014-12-20 MED ORDER — ACETAMINOPHEN 650 MG RE SUPP: 650.0000 mg | Freq: Four times a day (QID) | RECTAL | Status: DC | PRN

## 2014-12-20 MED ORDER — TRANEXAMIC ACID 1000 MG/10ML IV SOLN
2000.0000 mg | Freq: Once | INTRAVENOUS | Status: DC
  Filled 2014-12-20: qty 20

## 2014-12-20 MED ORDER — METHOCARBAMOL 500 MG PO TABS
500.0000 mg | ORAL_TABLET | Freq: Four times a day (QID) | ORAL | Status: DC | PRN
Start: 2014-12-20 — End: 2014-12-22
  Administered 2014-12-21 – 2014-12-22 (×2): 500 mg via ORAL
  Filled 2014-12-20 (×3): qty 1

## 2014-12-20 MED ORDER — POLYETHYLENE GLYCOL 3350 17 G PO PACK: 17.0000 g | PACK | Freq: Every day | ORAL | Status: DC | PRN

## 2014-12-20 MED ORDER — HYDROMORPHONE HCL 1 MG/ML IJ SOLN
0.5000 mg | INTRAMUSCULAR | Status: DC | PRN
  Administered 2014-12-20 – 2014-12-21 (×3): 0.5 mg via INTRAVENOUS
  Filled 2014-12-20 (×3): qty 1

## 2014-12-20 MED ORDER — RIVAROXABAN 10 MG PO TABS
10.0000 mg | ORAL_TABLET | Freq: Every day | ORAL | Status: DC
  Administered 2014-12-21 – 2014-12-22 (×2): 10 mg via ORAL
  Filled 2014-12-20 (×3): qty 1

## 2014-12-20 MED ORDER — METHOCARBAMOL 1000 MG/10ML IJ SOLN
500.0000 mg | Freq: Four times a day (QID) | INTRAVENOUS | Status: DC | PRN
  Administered 2014-12-20: 500 mg via INTRAVENOUS
  Filled 2014-12-20 (×2): qty 5

## 2014-12-20 MED ORDER — TRAMADOL HCL 50 MG PO TABS
50.0000 mg | ORAL_TABLET | Freq: Four times a day (QID) | ORAL | Status: DC | PRN
  Administered 2014-12-20 – 2014-12-22 (×3): 100 mg via ORAL
  Filled 2014-12-20 (×3): qty 2

## 2014-12-20 MED ORDER — LACTATED RINGERS IV SOLN
INTRAVENOUS | Status: DC
Start: 2014-12-20 — End: 2014-12-20

## 2014-12-20 MED ORDER — LEVOTHYROXINE SODIUM 112 MCG PO TABS
112.0000 ug | ORAL_TABLET | Freq: Every day | ORAL | Status: DC
  Administered 2014-12-21 – 2014-12-22 (×2): 112 ug via ORAL
  Filled 2014-12-20 (×3): qty 1

## 2014-12-20 MED ORDER — TRANEXAMIC ACID 1000 MG/10ML IV SOLN
2000.0000 mg | INTRAVENOUS | Status: DC | PRN
  Administered 2014-12-20: 2000 mg via TOPICAL

## 2014-12-20 MED ORDER — BUPIVACAINE HCL (PF) 0.25 % IJ SOLN
INTRAMUSCULAR | Status: AC
  Filled 2014-12-20: qty 30

## 2014-12-20 MED ORDER — ONDANSETRON HCL 4 MG PO TABS: 4.0000 mg | ORAL_TABLET | Freq: Four times a day (QID) | ORAL | Status: DC | PRN

## 2014-12-20 MED ORDER — FENTANYL CITRATE (PF) 100 MCG/2ML IJ SOLN
INTRAMUSCULAR | Status: AC
  Filled 2014-12-20: qty 4

## 2014-12-20 MED ORDER — LACTATED RINGERS IV SOLN
INTRAVENOUS | Status: DC
  Administered 2014-12-20: 1000 mL via INTRAVENOUS
  Administered 2014-12-20: 20:00:00 via INTRAVENOUS

## 2014-12-20 MED ORDER — BUPIVACAINE IN DEXTROSE 0.75-8.25 % IT SOLN
INTRATHECAL | Status: DC | PRN
  Administered 2014-12-20: 2 mL via INTRATHECAL

## 2014-12-20 MED ORDER — ACETAMINOPHEN 10 MG/ML IV SOLN
1000.0000 mg | Freq: Once | INTRAVENOUS | Status: AC
  Administered 2014-12-20: 1000 mg via INTRAVENOUS

## 2014-12-20 MED ORDER — CEFAZOLIN SODIUM-DEXTROSE 2-3 GM-% IV SOLR
2.0000 g | INTRAVENOUS | Status: AC
  Administered 2014-12-20: 2 g via INTRAVENOUS

## 2014-12-20 MED ORDER — ACETAMINOPHEN 500 MG PO TABS
1000.0000 mg | ORAL_TABLET | Freq: Four times a day (QID) | ORAL | Status: AC
  Administered 2014-12-20 – 2014-12-21 (×3): 1000 mg via ORAL
  Filled 2014-12-20 (×5): qty 2

## 2014-12-20 MED ORDER — EPHEDRINE SULFATE 50 MG/ML IJ SOLN
INTRAMUSCULAR | Status: DC | PRN
  Administered 2014-12-20: 5 mg via INTRAVENOUS
  Administered 2014-12-20: 10 mg via INTRAVENOUS

## 2014-12-20 MED ORDER — DIPHENHYDRAMINE HCL 12.5 MG/5ML PO ELIX: 12.5000 mg | ORAL_SOLUTION | ORAL | Status: DC | PRN

## 2014-12-20 MED ORDER — DEXAMETHASONE SODIUM PHOSPHATE 10 MG/ML IJ SOLN
10.0000 mg | Freq: Once | INTRAMUSCULAR | Status: AC
  Administered 2014-12-20: 10 mg via INTRAVENOUS

## 2014-12-20 MED ORDER — MENTHOL 3 MG MT LOZG: 1.0000 | LOZENGE | OROMUCOSAL | Status: DC | PRN

## 2014-12-20 MED ORDER — CHLORHEXIDINE GLUCONATE 4 % EX LIQD: 60.0000 mL | Freq: Once | CUTANEOUS | Status: DC

## 2014-12-20 MED ORDER — BISACODYL 10 MG RE SUPP: 10.0000 mg | Freq: Every day | RECTAL | Status: DC | PRN

## 2014-12-20 MED ORDER — LIDOCAINE HCL (CARDIAC) 20 MG/ML IV SOLN
INTRAVENOUS | Status: DC | PRN
  Administered 2014-12-20: 100 mg via INTRAVENOUS

## 2014-12-20 MED ORDER — ACETAMINOPHEN 325 MG PO TABS: 650.0000 mg | ORAL_TABLET | Freq: Four times a day (QID) | ORAL | Status: DC | PRN

## 2014-12-20 MED ORDER — MIDAZOLAM HCL 2 MG/2ML IJ SOLN
INTRAMUSCULAR | Status: AC
  Filled 2014-12-20: qty 4

## 2014-12-20 MED ORDER — HYDROMORPHONE HCL 2 MG PO TABS
2.0000 mg | ORAL_TABLET | ORAL | Status: DC | PRN
  Administered 2014-12-20 – 2014-12-21 (×3): 2 mg via ORAL
  Administered 2014-12-21: 4 mg via ORAL
  Administered 2014-12-21: 2 mg via ORAL
  Administered 2014-12-21 – 2014-12-22 (×2): 4 mg via ORAL
  Filled 2014-12-20: qty 2
  Filled 2014-12-20 (×3): qty 1
  Filled 2014-12-20 (×2): qty 2
  Filled 2014-12-20: qty 1

## 2014-12-20 MED ORDER — BUPIVACAINE HCL (PF) 0.25 % IJ SOLN
INTRAMUSCULAR | Status: DC | PRN
  Administered 2014-12-20: 30 mL

## 2014-12-20 MED ORDER — DOCUSATE SODIUM 100 MG PO CAPS
100.0000 mg | ORAL_CAPSULE | Freq: Two times a day (BID) | ORAL | Status: DC
  Administered 2014-12-20 – 2014-12-22 (×4): 100 mg via ORAL

## 2014-12-20 MED ORDER — DEXAMETHASONE SODIUM PHOSPHATE 10 MG/ML IJ SOLN
10.0000 mg | Freq: Once | INTRAMUSCULAR | Status: AC
Start: 2014-12-21 — End: 2014-12-21
  Administered 2014-12-21: 10 mg via INTRAVENOUS
  Filled 2014-12-20: qty 1

## 2014-12-20 MED ORDER — FENTANYL CITRATE (PF) 100 MCG/2ML IJ SOLN
INTRAMUSCULAR | Status: DC | PRN
  Administered 2014-12-20: 50 ug via INTRAVENOUS

## 2014-12-20 MED ORDER — ACETAMINOPHEN 10 MG/ML IV SOLN
INTRAVENOUS | Status: AC
  Filled 2014-12-20: qty 100

## 2014-12-20 MED ORDER — ONDANSETRON HCL 4 MG/2ML IJ SOLN
4.0000 mg | Freq: Four times a day (QID) | INTRAMUSCULAR | Status: DC | PRN
  Administered 2014-12-20 – 2014-12-21 (×2): 4 mg via INTRAVENOUS
  Filled 2014-12-20 (×2): qty 2

## 2014-12-20 SURGICAL SUPPLY — 44 items
BAG DECANTER FOR FLEXI CONT (MISCELLANEOUS) ×3 IMPLANT
BAG SPEC THK2 15X12 ZIP CLS (MISCELLANEOUS)
BAG ZIPLOCK 12X15 (MISCELLANEOUS) IMPLANT
BLADE EXTENDED COATED 6.5IN (ELECTRODE) ×3 IMPLANT
BLADE SAG 18X100X1.27 (BLADE) ×3 IMPLANT
CAPT HIP TOTAL 2 ×2 IMPLANT
CLOSURE WOUND 1/2 X4 (GAUZE/BANDAGES/DRESSINGS) ×1
COVER PERINEAL POST (MISCELLANEOUS) ×3 IMPLANT
DECANTER SPIKE VIAL GLASS SM (MISCELLANEOUS) ×3 IMPLANT
DRAPE C-ARM 42X120 X-RAY (DRAPES) ×3 IMPLANT
DRAPE STERI IOBAN 125X83 (DRAPES) ×3 IMPLANT
DRAPE U-SHAPE 47X51 STRL (DRAPES) ×9 IMPLANT
DRSG ADAPTIC 3X8 NADH LF (GAUZE/BANDAGES/DRESSINGS) ×3 IMPLANT
DRSG MEPILEX BORDER 4X4 (GAUZE/BANDAGES/DRESSINGS) ×3 IMPLANT
DRSG MEPILEX BORDER 4X8 (GAUZE/BANDAGES/DRESSINGS) ×3 IMPLANT
DURAPREP 26ML APPLICATOR (WOUND CARE) ×3 IMPLANT
ELECT REM PT RETURN 9FT ADLT (ELECTROSURGICAL) ×3
ELECTRODE REM PT RTRN 9FT ADLT (ELECTROSURGICAL) ×1 IMPLANT
EVACUATOR 1/8 PVC DRAIN (DRAIN) ×3 IMPLANT
FACESHIELD WRAPAROUND (MASK) ×12 IMPLANT
FACESHIELD WRAPAROUND OR TEAM (MASK) ×4 IMPLANT
GLOVE BIO SURGEON STRL SZ7.5 (GLOVE) ×3 IMPLANT
GLOVE BIO SURGEON STRL SZ8 (GLOVE) ×6 IMPLANT
GLOVE BIOGEL PI IND STRL 8 (GLOVE) ×2 IMPLANT
GLOVE BIOGEL PI INDICATOR 8 (GLOVE) ×4
GOWN STRL REUS W/TWL LRG LVL3 (GOWN DISPOSABLE) ×3 IMPLANT
GOWN STRL REUS W/TWL XL LVL3 (GOWN DISPOSABLE) ×3 IMPLANT
KIT BASIN OR (CUSTOM PROCEDURE TRAY) ×3 IMPLANT
NDL SAFETY ECLIPSE 18X1.5 (NEEDLE) ×1 IMPLANT
NEEDLE HYPO 18GX1.5 SHARP (NEEDLE) ×6
PACK TOTAL JOINT (CUSTOM PROCEDURE TRAY) ×3 IMPLANT
PEN SKIN MARKING BROAD (MISCELLANEOUS) ×3 IMPLANT
STRIP CLOSURE SKIN 1/2X4 (GAUZE/BANDAGES/DRESSINGS) ×2 IMPLANT
SUT ETHIBOND NAB CT1 #1 30IN (SUTURE) ×3 IMPLANT
SUT MNCRL AB 4-0 PS2 18 (SUTURE) ×3 IMPLANT
SUT VIC AB 2-0 CT1 27 (SUTURE) ×6
SUT VIC AB 2-0 CT1 TAPERPNT 27 (SUTURE) ×2 IMPLANT
SUT VLOC 180 0 24IN GS25 (SUTURE) ×3 IMPLANT
SYR 30ML LL (SYRINGE) ×3 IMPLANT
SYR 50ML LL SCALE MARK (SYRINGE) ×2 IMPLANT
TOWEL OR 17X26 10 PK STRL BLUE (TOWEL DISPOSABLE) ×3 IMPLANT
TOWEL OR NON WOVEN STRL DISP B (DISPOSABLE) ×2 IMPLANT
TRAY FOLEY W/METER SILVER 14FR (SET/KITS/TRAYS/PACK) ×3 IMPLANT
YANKAUER SUCT BULB TIP 10FT TU (MISCELLANEOUS) ×2 IMPLANT

## 2014-12-20 NOTE — Anesthesia Procedure Notes (Addendum)
Date/Time: 12/20/2014 3:50 PM Performed by: Carleene Cooper A Pre-anesthesia Checklist: Patient identified, Timeout performed, Emergency Drugs available, Suction available and Patient being monitored Oxygen Delivery Method: Simple face mask Dental Injury: Teeth and Oropharynx as per pre-operative assessment    Spinal Patient location during procedure: OR Start time: 12/20/2014 4:05 PM End time: 12/20/2014 4:11 PM Staffing Resident/CRNA: Carleene Cooper A Performed by: resident/CRNA  Preanesthetic Checklist Completed: patient identified, site marked, surgical consent, pre-op evaluation, timeout performed, IV checked, risks and benefits discussed and monitors and equipment checked Spinal Block Patient position: sitting Prep: Betadine Patient monitoring: heart rate, continuous pulse ox and blood pressure Approach: left paramedian Location: L2-3 Injection technique: single-shot Needle Needle type: Spinocan  Needle gauge: 22 G Needle length: 9 cm Assessment Sensory level: T4 Additional Notes Pt placed in sitting position. Tolerated well. One midline attempt by CRNA. Second attempt left paramedian. + CSF, - heme. Spinal kit expiration date-  2016-04-30

## 2014-12-20 NOTE — Op Note (Signed)
OPERATIVE REPORT  PREOPERATIVE DIAGNOSIS: Osteoarthritis of the Left hip.   POSTOPERATIVE DIAGNOSIS: Osteoarthritis of the Left  hip.   PROCEDURE: Left total hip arthroplasty, anterior approach.   SURGEON: Gaynelle Arabian, MD   ASSISTANT: Molli Barrows, PA-C  ANESTHESIA:  Spinal  ESTIMATED BLOOD LOSS:-200 ml    DRAINS: Hemovac x1.   COMPLICATIONS: None   CONDITION: PACU - hemodynamically stable.   BRIEF CLINICAL NOTE: Shannon Grant is a 69 y.o. female who has advanced end-  stage arthritis of her left  hip with progressively worsening pain and  dysfunction.The patient has failed nonoperative management and presents for  total hip arthroplasty.   PROCEDURE IN DETAIL: After successful administration of spinal  anesthetic, the traction boots for the [employer] bed were placed on both  feet and the patient was placed onto the [place] bed, boots placed into the leg  holders. The Left hip was then isolated from the perineum with plastic  drapes and prepped and draped in the usual sterile fashion. ASIS and  greater trochanter were marked and a oblique incision was made, starting  at about 1 cm lateral and 2 cm distal to the ASIS and coursing towards  the anterior cortex of the femur. The skin was cut with a 10 blade  through subcutaneous tissue to the level of the fascia overlying the  tensor fascia lata muscle. The fascia was then incised in line with the  incision at the junction of the anterior third and posterior 2/3rd. The  muscle was teased off the fascia and then the interval between the TFL  and the rectus was developed. The Hohmann retractor was then placed at  the top of the femoral neck over the capsule. The vessels overlying the  capsule were cauterized and the fat on top of the capsule was removed.  A Hohmann retractor was then placed anterior underneath the rectus  femoris to give exposure to the entire anterior capsule. A T-shaped  capsulotomy was performed. The  edges were tagged and the femoral head  was identified.       Osteophytes are removed off the superior acetabulum.  The femoral neck was then cut in situ with an oscillating saw. Traction  was then applied to the left lower extremity utilizing the [facility]  traction. The femoral head was then removed. Retractors were placed  around the acetabulum and then circumferential removal of the labrum was  performed. Osteophytes were also removed. Reaming starts at 45 mm to  medialize and  Increased in 2 mm increments to 51 mm. We reamed in  approximately 40 degrees of abduction, 20 degrees anteversion. A 52 mm  pinnacle acetabular shell was then impacted in anatomic position under  fluoroscopic guidance with excellent purchase. We did not need to place  any additional dome screws. A 32 mm neutral + 4 marathon liner was then  placed into the acetabular shell.       The femoral lift was then placed along the lateral aspect of the femur  just distal to the vastus ridge. The leg was  externally rotated and capsule  was stripped off the inferior aspect of the femoral neck down to the  level of the lesser trochanter, this was done with electrocautery. The femur was lifted after this was performed. The  leg was then placed and extended in adducted position to essentially delivering the femur. We also removed the capsule superiorly and the  piriformis from the  piriformis fossa to gain excellent exposure of the  proximal femur. Rongeur was used to remove some cancellous bone to get  into the lateral portion of the proximal femur for placement of the  initial starter reamer. The starter broaches was placed  the starter broach  and was shown to go down the center of the canal. Broaching  with the  Corail system was then performed starting at size 8, coursing  Up to size 10. A size 10 had excellent torsional and rotational  and axial stability. The trial standard offset neck was then placed  with a 32 + 5 trial  head. The hip was then reduced. We confirmed that  the stem was in the canal both on AP and lateral x-rays. It also has excellent sizing. The hip was reduced with outstanding stability through full extension, full external rotation,  and then flexion in adduction internal rotation. AP pelvis was taken  and the leg lengths were measured and found to be exactly equal. Hip  was then dislocated again and the femoral head and neck removed. The  femoral broach was removed. Size 10 Corail stem with a standard offset  neck was then impacted into the femur following native anteversion. Has  excellent purchase in the canal. Excellent torsional and rotational and  axial stability. It is confirmed to be in the canal on AP and lateral  fluoroscopic views. The 32 + 5 ceramic head was placed and the hip  reduced with outstanding stability. Again AP pelvis was taken and it  confirmed that the leg lengths were equal. The wound was then copiously  irrigated with saline solution and the capsule reattached and repaired  with Ethibond suture. 30 ml of .25% Bupivicaine injected into the capsule and into the edge of the tensor fascia lata as well as subcutaneous tissue. The fascia overlying the tensor fascia lata was  then closed with a running #1 V-Loc. Subcu was closed with interrupted  2-0 Vicryl and subcuticular running 4-0 Monocryl. Incision was cleaned  and dried. Steri-Strips and a bulky sterile dressing applied. Hemovac  drain was hooked to suction and then he was awakened and transported to  recovery in stable condition.        Please note that a surgical assistant was a medical necessity for this procedure to perform it in a safe and expeditious manner. Assistant was necessary to provide appropriate retraction of vital neurovascular structures and to prevent femoral fracture and allow for anatomic placement of the prosthesis.  Gaynelle Arabian, M.D.

## 2014-12-20 NOTE — Transfer of Care (Signed)
Immediate Anesthesia Transfer of Care Note  Patient: Shannon Grant  Procedure(s) Performed: Procedure(s): LEFT TOTAL HIP ARTHROPLASTY ANTERIOR APPROACH (Left)  Patient Location: PACU  Anesthesia Type:Spinal  Level of Consciousness:  sedated, patient cooperative and responds to stimulation  Airway & Oxygen Therapy:Patient Spontanous Breathing, nasal cannula applied  Post-op Assessment:  Report given to PACU RN and Post -op Vital signs reviewed and stable  Post vital signs:  Reviewed and stable  Last Vitals:  Filed Vitals:   12/20/14 1319  BP: 189/90  Pulse: 67  Temp: 36.8 C  Resp: 18    Complications: No apparent anesthesia complications

## 2014-12-20 NOTE — Anesthesia Postprocedure Evaluation (Signed)
  Anesthesia Post-op Note  Patient: Shannon Grant  Procedure(s) Performed: Procedure(s): LEFT TOTAL HIP ARTHROPLASTY ANTERIOR APPROACH (Left)  Patient Location: PACU  Anesthesia Type:Spinal  Level of Consciousness: awake, alert  and oriented  Airway and Oxygen Therapy: Patient Spontanous Breathing  Post-op Pain: none  Post-op Assessment: Post-op Vital signs reviewed, Patient's Cardiovascular Status Stable, Respiratory Function Stable, Patent Airway, No signs of Nausea or vomiting, Pain level controlled, No headache, No backache and Spinal receding LLE Motor Response: Purposeful movement   RLE Motor Response: Purposeful movement   L Sensory Level: L1-Inguinal (groin) region R Sensory Level: L1-Inguinal (groin) region  Post-op Vital Signs: Reviewed and stable  Last Vitals:  Filed Vitals:   12/20/14 1915  BP: 147/59  Pulse: 57  Temp:   Resp: 19    Complications: No apparent anesthesia complications

## 2014-12-20 NOTE — Anesthesia Preprocedure Evaluation (Addendum)
Anesthesia Evaluation  Patient identified by MRN, date of birth, ID band Patient awake    Reviewed: Allergy & Precautions, NPO status , Patient's Chart, lab work & pertinent test results  Airway Mallampati: II  TM Distance: >3 FB Neck ROM: Full    Dental no notable dental hx.    Pulmonary neg pulmonary ROS, former smoker,    Pulmonary exam normal breath sounds clear to auscultation       Cardiovascular hypertension, Pt. on medications Normal cardiovascular exam Rhythm:Regular Rate:Normal     Neuro/Psych negative neurological ROS  negative psych ROS   GI/Hepatic Neg liver ROS, hiatal hernia,   Endo/Other  Hypothyroidism   Renal/GU negative Renal ROS  negative genitourinary   Musculoskeletal negative musculoskeletal ROS (+)   Abdominal   Peds negative pediatric ROS (+)  Hematology negative hematology ROS (+)   Anesthesia Other Findings   Reproductive/Obstetrics negative OB ROS                            Anesthesia Physical Anesthesia Plan  ASA: II  Anesthesia Plan: Spinal   Post-op Pain Management:    Induction:   Airway Management Planned: Simple Face Mask  Additional Equipment:   Intra-op Plan:   Post-operative Plan:   Informed Consent: I have reviewed the patients History and Physical, chart, labs and discussed the procedure including the risks, benefits and alternatives for the proposed anesthesia with the patient or authorized representative who has indicated his/her understanding and acceptance.   Dental advisory given  Plan Discussed with: CRNA  Anesthesia Plan Comments:         Anesthesia Quick Evaluation

## 2014-12-20 NOTE — Interval H&P Note (Signed)
History and Physical Interval Note:  12/20/2014 3:19 PM  Shannon Grant  has presented today for surgery, with the diagnosis of Left Hip Osteoarthritis  The various methods of treatment have been discussed with the patient and family. After consideration of risks, benefits and other options for treatment, the patient has consented to  Procedure(s): LEFT TOTAL HIP ARTHROPLASTY ANTERIOR APPROACH (Left) as a surgical intervention .  The patient's history has been reviewed, patient examined, no change in status, stable for surgery.  I have reviewed the patient's chart and labs.  Questions were answered to the patient's satisfaction.     Gearlean Alf

## 2014-12-20 NOTE — Anesthesia Postprocedure Evaluation (Signed)
  Anesthesia Post-op Note  Patient: Shannon Grant  Procedure(s) Performed: Procedure(s) (LRB): LEFT TOTAL HIP ARTHROPLASTY ANTERIOR APPROACH (Left)  Patient Location: PACU  Anesthesia Type: Spinal  Level of Consciousness: awake and alert   Airway and Oxygen Therapy: Patient Spontanous Breathing  Post-op Pain: mild  Post-op Assessment: Post-op Vital signs reviewed, Patient's Cardiovascular Status Stable, Respiratory Function Stable, Patent Airway and No signs of Nausea or vomiting  Last Vitals:  Filed Vitals:   12/20/14 2144  BP: 165/75  Pulse: 60  Temp: 36.7 C  Resp: 16    Post-op Vital Signs: stable   Complications: No apparent anesthesia complications

## 2014-12-21 ENCOUNTER — Encounter (HOSPITAL_COMMUNITY): Payer: Self-pay | Admitting: Orthopedic Surgery

## 2014-12-21 LAB — CBC
HEMATOCRIT: 34 % — AB (ref 36.0–46.0)
Hemoglobin: 11.4 g/dL — ABNORMAL LOW (ref 12.0–15.0)
MCH: 32.1 pg (ref 26.0–34.0)
MCHC: 33.5 g/dL (ref 30.0–36.0)
MCV: 95.8 fL (ref 78.0–100.0)
Platelets: 166 10*3/uL (ref 150–400)
RBC: 3.55 MIL/uL — AB (ref 3.87–5.11)
RDW: 13.1 % (ref 11.5–15.5)
WBC: 7.6 10*3/uL (ref 4.0–10.5)

## 2014-12-21 LAB — BASIC METABOLIC PANEL
ANION GAP: 8 (ref 5–15)
BUN: 9 mg/dL (ref 6–20)
CHLORIDE: 104 mmol/L (ref 101–111)
CO2: 25 mmol/L (ref 22–32)
Calcium: 8.9 mg/dL (ref 8.9–10.3)
Creatinine, Ser: 0.7 mg/dL (ref 0.44–1.00)
GFR calc Af Amer: >60 mL/min (ref >60)
GFR calc non Af Amer: >60 mL/min (ref >60)
GLUCOSE: 162 mg/dL — AB (ref 65–99)
POTASSIUM: 3.9 mmol/L (ref 3.5–5.1)
Sodium: 137 mmol/L (ref 135–145)

## 2014-12-21 MED ORDER — HYDROMORPHONE HCL 2 MG PO TABS: 2.0000 mg | ORAL_TABLET | ORAL | Status: DC | PRN

## 2014-12-21 MED ORDER — RIVAROXABAN 10 MG PO TABS: 10.0000 mg | ORAL_TABLET | Freq: Every day | ORAL | Status: DC

## 2014-12-21 MED ORDER — TRAMADOL HCL 50 MG PO TABS: 50.0000 mg | ORAL_TABLET | Freq: Four times a day (QID) | ORAL | Status: DC | PRN

## 2014-12-21 MED ORDER — METHOCARBAMOL 500 MG PO TABS: 500.0000 mg | ORAL_TABLET | Freq: Four times a day (QID) | ORAL | Status: DC | PRN

## 2014-12-21 NOTE — Evaluation (Signed)
Physical Therapy Evaluation Patient Details Name: Shannon Grant MRN: [id number] DOB: June 28, 1945 Today's Date: 12/21/2014   History of Present Illness  L THR; s/ R HQP(5916, post approach)  Clinical Impression  Pt s/p L THR presents with decreased L LE strength/ROM and post op pain, nausea and dizziness limiting functional mobility.  Pt should progress to dc home with family assist and HHPT follow up.    Follow Up Recommendations Home health PT    Equipment Recommendations  None recommended by PT    Recommendations for Other Services OT consult     Precautions / Restrictions Precautions Precautions: Fall Restrictions Weight Bearing Restrictions: Yes LLE Weight Bearing: Weight bearing as tolerated      Mobility  Bed Mobility Overal bed mobility: Needs Assistance Bed Mobility: Supine to Sit     Supine to sit: Min assist;Mod assist     General bed mobility comments: increased time and cues for sequence and use of R LE to self assist  Transfers Overall transfer level: Needs assistance Equipment used: Rolling walker (2 wheeled) Transfers: Sit to/from Stand Sit to Stand: Min assist;From elevated surface         General transfer comment: cues for LE management and use of UEs to self assist  Ambulation/Gait Ambulation/Gait assistance: Min assist;Mod assist Ambulation Distance (Feet): 10 Feet Assistive device: Rolling walker (2 wheeled) Gait Pattern/deviations: Step-to pattern;Decreased step length - right;Decreased step length - left;Shuffle;Trunk flexed Gait velocity: decr   General Gait Details: cues for sequence, posture and position from [employer]            Wheelchair Mobility    Modified Rankin (Stroke Patients Only)       Balance                                             Pertinent Vitals/Pain Pain Assessment: 0-10 Pain Score: 3  Pain Location: L hip Pain Descriptors / Indicators: Aching;Sore Pain Intervention(s):  Limited activity within patient's tolerance;Monitored during session;Premedicated before session;Ice applied    Home Living Family/patient expects to be discharged to:: Private residence Living Arrangements: Alone Available Help at Discharge: Family;Friend(s) Type of Home: House Home Access: Stairs to enter   [employer] of Steps: 1+1 Home Layout: Two level Home Equipment: Environmental consultant - 2 wheels;Cane - single point;Crutches      Prior Function Level of Independence: Independent               Hand Dominance        Extremity/Trunk Assessment   Upper Extremity Assessment: Overall WFL for tasks assessed           Lower Extremity Assessment: LLE deficits/detail   LLE Deficits / Details: 2+/5 strength at hip with AAROM at hip to 95 flex and 20 abd  Cervical / Trunk Assessment: Normal  Communication   Communication: No difficulties  Cognition Arousal/Alertness: Awake/alert Behavior During Therapy: WFL for tasks assessed/performed Overall Cognitive Status: No family/caregiver present to determine baseline cognitive functioning (Pt slow to process and easily confused with simple cues; ?me)                      General Comments      Exercises Total Joint Exercises Ankle Circles/Pumps: AROM;Both;15 reps;Supine Quad Sets: AROM;Both;10 reps;Supine Heel Slides: AAROM;Left;20 reps;Supine Hip ABduction/ADduction: AAROM;Left;15 reps;Supine      Assessment/Plan  PT Assessment Patient needs continued PT services  PT Diagnosis Difficulty walking   PT Problem List Decreased strength;Decreased range of motion;Decreased activity tolerance;Decreased mobility;Decreased knowledge of use of DME;Pain  PT Treatment Interventions DME instruction;Gait training;Stair training;Functional mobility training;Therapeutic activities;Therapeutic exercise;Patient/family education   PT Goals (Current goals can be found in the Care Plan section) Acute Rehab PT Goals Patient  Stated Goal: Home PT Goal Formulation: With patient Time For Goal Achievement: 12/26/14 Potential to Achieve Goals: Good    Frequency 7X/week   Barriers to discharge        Co-evaluation               End of Session Equipment Utilized During Treatment: Gait belt Activity Tolerance: Other (comment) (nausea, dizziness) Patient left: in chair;with call bell/phone within reach;with nursing/sitter in room Nurse Communication: Mobility status         Time: 0820-0858 PT Time Calculation (min) (ACUTE ONLY): 38 min   Charges:   PT Evaluation $Initial PT Evaluation Tier I: 1 Procedure PT Treatments $Gait Training: 8-22 mins $Therapeutic Exercise: 8-22 mins   PT G Codes:        BRADSHAW,HUNTER 04-Jan-2015, 10:17 AM

## 2014-12-21 NOTE — Progress Notes (Signed)
Utilization review completed.  

## 2014-12-21 NOTE — Discharge Instructions (Signed)
Dr. Gaynelle Arabian Total Joint Specialist [facility] [address] [place], [place] [address] [phone]  ANTERIOR APPROACH TOTAL HIP REPLACEMENT POSTOPERATIVE DIRECTIONS   Hip Rehabilitation, Guidelines Following Surgery  The results of a hip operation are greatly improved after range of motion and muscle strengthening exercises. Follow all safety measures which are given to protect your hip. If any of these exercises cause increased pain or swelling in your joint, decrease the amount until you are comfortable again. Then slowly increase the exercises. Call your caregiver if you have problems or questions.   HOME CARE INSTRUCTIONS  Remove items at home which could result in a fall. This includes throw rugs or furniture in walking pathways.   ICE to the affected hip every three hours for 30 minutes at a time and then as needed for pain and swelling.  Continue to use ice on the hip for pain and swelling from surgery. You may notice swelling that will progress down to the foot and ankle.  This is normal after surgery.  Elevate the leg when you are not up walking on it.    Continue to use the breathing machine which will help keep your temperature down.  It is common for your temperature to cycle up and down following surgery, especially at night when you are not up moving around and exerting yourself.  The breathing machine keeps your lungs expanded and your temperature down.   DIET You may resume your previous home diet once your are discharged from the hospital.  DRESSING / WOUND CARE / SHOWERING You may start showering once you are discharged home but do not submerge the incision under water. Just pat the incision dry and apply a dry gauze dressing on daily. Change the surgical dressing daily and reapply a dry dressing each time.  ACTIVITY Walk with your walker as instructed. Use walker as long as suggested by your caregivers. Avoid periods of inactivity  such as sitting longer than an hour when not asleep. This helps prevent blood clots.  You may resume a sexual relationship in one month or when given the OK by your doctor.  You may return to work once you are cleared by your doctor.  Do not drive a car for 6 weeks or until released by you surgeon.  Do not drive while taking narcotics.  WEIGHT BEARING Other:  Weight bearing as tolerated  POSTOPERATIVE CONSTIPATION PROTOCOL Constipation - defined medically as fewer than three stools per week and severe constipation as less than one stool per week.  One of the most common issues patients have following surgery is constipation.  Even if you have a regular bowel pattern at home, your normal regimen is likely to be disrupted due to multiple reasons following surgery.  Combination of anesthesia, postoperative narcotics, change in appetite and fluid intake all can affect your bowels.  In order to avoid complications following surgery, here are some recommendations in order to help you during your recovery period.  Colace (docusate) - Pick up an over-the-counter form of Colace or another stool softener and take twice a day as long as you are requiring postoperative pain medications.  Take with a full glass of water daily.  If you experience loose stools or diarrhea, hold the colace until you stool forms back up.  If your symptoms do not get better within 1 week or if they get worse, check with your doctor.  Dulcolax (bisacodyl) - Pick up over-the-counter and take as directed by  the product packaging as needed to assist with the movement of your bowels.  Take with a full glass of water.  Use this product as needed if not relieved by Colace only.   MiraLax (polyethylene glycol) - Pick up over-the-counter to have on hand.  MiraLax is a solution that will increase the amount of water in your bowels to assist with bowel movements.  Take as directed and can mix with a glass of water, juice, soda, coffee, or tea.   Take if you go more than two days without a movement. Do not use MiraLax more than once per day. Call your doctor if you are still constipated or irregular after using this medication for 7 days in a row.  If you continue to have problems with postoperative constipation, please contact the office for further assistance and recommendations.  If you experience "the worst abdominal pain ever" or develop nausea or vomiting, please contact the office immediatly for further recommendations for treatment.  ITCHING  If you experience itching with your medications, try taking only a single pain pill, or even half a pain pill at a time.  You can also use Benadryl over the counter for itching or also to help with sleep.   TED HOSE STOCKINGS Wear the elastic stockings on both legs for three weeks following surgery during the day but you may remove then at night for sleeping.  MEDICATIONS See your medication summary on the After Visit Summary that the nursing staff will review with you prior to discharge.  You may have some home medications which will be placed on hold until you complete the course of blood thinner medication.  It is important for you to complete the blood thinner medication as prescribed by your surgeon.  Continue your approved medications as instructed at time of discharge.  PRECAUTIONS If you experience chest pain or shortness of breath - call 911 immediately for transfer to the hospital emergency department.  If you develop a fever greater that 101 F, purulent drainage from wound, increased redness or drainage from wound, foul odor from the wound/dressing, or calf pain - CONTACT YOUR SURGEON.                                                   FOLLOW-UP APPOINTMENTS Make sure you keep all of your appointments after your operation with your surgeon and caregivers. You should call the office at the above phone number and make an appointment for approximately two weeks after the date of your  surgery or on the date instructed by your surgeon outlined in the "After Visit Summary".  RANGE OF MOTION AND STRENGTHENING EXERCISES  These exercises are designed to help you keep full movement of your hip joint. Follow your caregiver's or physical therapist's instructions. Perform all exercises about fifteen times, three times per day or as directed. Exercise both hips, even if you have had only one joint replacement. These exercises can be done on a training (exercise) mat, on the floor, on a table or on a bed. Use whatever works the best and is most comfortable for you. Use music or television while you are exercising so that the exercises are a pleasant break in your day. This will make your life better with the exercises acting as a break in routine you can look forward to.  Lying on  your back, slowly slide your foot toward your buttocks, raising your knee up off the floor. Then slowly slide your foot back down until your leg is straight again.  Lying on your back spread your legs as far apart as you can without causing discomfort.  Lying on your side, raise your upper leg and foot straight up from the floor as far as is comfortable. Slowly lower the leg and repeat.  Lying on your back, tighten up the muscle in the front of your thigh (quadriceps muscles). You can do this by keeping your leg straight and trying to raise your heel off the floor. This helps strengthen the largest muscle supporting your knee.  Lying on your back, tighten up the muscles of your buttocks both with the legs straight and with the knee bent at a comfortable angle while keeping your heel on the floor.   IF YOU ARE TRANSFERRED TO A SKILLED REHAB FACILITY If the patient is transferred to a skilled rehab facility following release from the hospital, a list of the current medications will be sent to the facility for the patient to continue.  When discharged from the skilled rehab facility, please have the facility set up the  patient's Catherine prior to being released. Also, the skilled facility will be responsible for providing the patient with their medications at time of release from the facility to include their pain medication, the muscle relaxants, and their blood thinner medication. If the patient is still at the rehab facility at time of the two week follow up appointment, the skilled rehab facility will also need to assist the patient in arranging follow up appointment in our office and any transportation needs.  MAKE SURE YOU:  Understand these instructions.  Get help right away if you are not doing well or get worse.    Pick up stool softner and laxative for home use following surgery while on pain medications. Do not submerge incision under water. Please use good hand washing techniques while changing dressing each day. May shower starting three days after surgery. Please use a clean towel to pat the incision dry following showers. Continue to use ice for pain and swelling after surgery. Do not use any lotions or creams on the incision until instructed by your surgeon.    Information on my medicine - XARELTO (Rivaroxaban)  This medication education was reviewed with me or my healthcare representative as part of my discharge preparation.  The pharmacist that spoke with me during my hospital stay was:  Cheril Slattery, [facility]  Why was Xarelto prescribed for you? Xarelto was prescribed for you to reduce the risk of blood clots forming after orthopedic surgery. The medical term for these abnormal blood clots is venous thromboembolism (VTE).  What do you need to know about xarelto ? Take your Xarelto ONCE DAILY at the same time every day. You may take it either with or without food.  If you have difficulty swallowing the tablet whole, you may crush it and mix in applesauce just prior to taking your dose.  Take Xarelto exactly as prescribed by your doctor and DO NOT stop taking  Xarelto without talking to the doctor who prescribed the medication.  Stopping without other VTE prevention medication to take the place of Xarelto may increase your risk of developing a clot.  After discharge, you should have regular check-up appointments with your healthcare provider that is prescribing your Xarelto.    What do you do if you miss a  dose? If you miss a dose, take it as soon as you remember on the same day then continue your regularly scheduled once daily regimen the next day. Do not take two doses of Xarelto on the same day.   Important Safety Information A possible side effect of Xarelto is bleeding. You should call your healthcare provider right away if you experience any of the following: ? Bleeding from an injury or your nose that does not stop. ? Unusual colored urine (red or dark brown) or unusual colored stools (red or black). ? Unusual bruising for unknown reasons. ? A serious fall or if you hit your head (even if there is no bleeding).  Some medicines may interact with Xarelto and might increase your risk of bleeding while on Xarelto. To help avoid this, consult your healthcare provider or pharmacist prior to using any new prescription or non-prescription medications, including herbals, vitamins, non-steroidal anti-inflammatory drugs (NSAIDs) and supplements.  This website has more information on Xarelto: [email or web address]

## 2014-12-21 NOTE — Progress Notes (Signed)
Physical Therapy Treatment Patient Details Name: Shannon Grant MRN: [id number] DOB: January 10, 1946 Today's Date: 12/21/2014    History of Present Illness Pt is s/p L THR- DA on 12/20/14. Pt with PMH of R [id number], post approach)    PT Comments    Improvement in activity tolerance with decreased fatigue/nausea but increased c/o pain.  Pt continues very cooperative but with increased time needed to process simple cues.  Follow Up Recommendations  Home health PT     Equipment Recommendations  None recommended by PT    Recommendations for Other Services OT consult     Precautions / Restrictions Precautions Precautions: Fall Restrictions Weight Bearing Restrictions: No LLE Weight Bearing: Weight bearing as tolerated    Mobility  Bed Mobility Overal bed mobility: Needs Assistance Bed Mobility: Sit to Supine       Sit to supine: Min assist   General bed mobility comments: cues for sequence and use of R LE to self assist  Transfers Overall transfer level: Needs assistance Equipment used: Rolling walker (2 wheeled) Transfers: Sit to/from Stand Sit to Stand: Min assist         General transfer comment: cues for hand placement. increased time to process information given.  Ambulation/Gait Ambulation/Gait assistance: Min assist Ambulation Distance (Feet): 58 Feet Assistive device: Rolling walker (2 wheeled) Gait Pattern/deviations: Step-to pattern;Decreased step length - right;Decreased step length - left;Shuffle;Trunk flexed Gait velocity: decr   General Gait Details: cues for sequence, posture and position from [employer]            Wheelchair Mobility    Modified Rankin (Stroke Patients Only)       Balance                                    Cognition Arousal/Alertness: Awake/alert Behavior During Therapy: WFL for tasks assessed/performed Overall Cognitive Status: No family/caregiver present to determine baseline cognitive  functioning                      Exercises      General Comments        Pertinent Vitals/Pain Pain Assessment: 0-10 Pain Score: 7  Pain Location: L hip with activity Pain Descriptors / Indicators: Aching;Sore Pain Intervention(s): Limited activity within patient's tolerance;Monitored during session;Patient requesting pain meds-RN notified;Ice applied    Home Living Family/patient expects to be discharged to:: Private residence Living Arrangements: Alone Available Help at Discharge: Family;Friend(s) Type of Home: House Home Access: Stairs to enter   Home Layout: Two level Home Equipment: Environmental consultant - 2 wheels;Cane - single point;Crutches;Adaptive equipment Additional Comments: pt checking to see if she still has a shower chair.     Prior Function Level of Independence: Independent          PT Goals (current goals can now be found in the care plan section) Acute Rehab PT Goals Patient Stated Goal: Home PT Goal Formulation: With patient Time For Goal Achievement: 12/26/14 Potential to Achieve Goals: Good Progress towards PT goals: Progressing toward goals    Frequency  7X/week    PT Plan Current plan remains appropriate    Co-evaluation             End of Session Equipment Utilized During Treatment: Gait belt Activity Tolerance: Patient tolerated treatment well Patient left: with call bell/phone within reach;with nursing/sitter in room;in bed     Time: 6568-1275 PT Time  Calculation (min) (ACUTE ONLY): 20 min  Charges:  $Gait Training: 8-22 mins                    G Codes:      BRADSHAW,HUNTER January 16, 2015, 2:08 PM

## 2014-12-21 NOTE — Progress Notes (Signed)
   Subjective: 1 Day Post-Op Procedure(s) (LRB): LEFT TOTAL HIP ARTHROPLASTY ANTERIOR APPROACH (Left) Patient reports pain as mild.   Patient seen in rounds with Dr. Wynelle Link. Patient is well, but has had some minor complaints of nausea/vomiting. She reports that she drank some water this morning and has been feeling nausea since. No SOB or chest pain. No issues overnight.    Objective: Vital signs in last 24 hours: Temp:  [97.4 F (36.3 C)-98.3 F (36.8 C)] 98 F (36.7 C) (09/22 0200) Pulse Rate:  [53-67] 60 (09/22 0200) Resp:  [14-21] 16 (09/22 0200) BP: (120-189)/(47-90) 140/63 mmHg (09/22 0200) SpO2:  [96 %-100 %] 99 % (09/22 0200) Weight:  [72.349 kg (159 lb 8 oz)] 72.349 kg (159 lb 8 oz) (09/21 1340)  Intake/Output from previous day:  Intake/Output Summary (Last 24 hours) at 12/21/14 0758 Last data filed at 12/21/14 0600  Gross per 24 hour  Intake 3996.25 ml  Output    835 ml  Net 3161.25 ml     Labs:  Recent Labs  12/21/14 0505  HGB 11.4*    Recent Labs  12/21/14 0505  WBC 7.6  RBC 3.55*  HCT 34.0*  PLT 166    Recent Labs  12/21/14 0505  NA 137  K 3.9  CL 104  CO2 25  BUN 9  CREATININE 0.70  GLUCOSE 162*  CALCIUM 8.9    EXAM General - Patient is Alert and Oriented Extremity - Neurologically intact Intact pulses distally Dorsiflexion/Plantar flexion intact Compartment soft Dressing - dressing C/D/I Motor Function - intact, moving foot and toes well on exam.  Hemovac pulled without difficulty.  Past Medical History  Diagnosis Date  . Hypertension   . Thyroid disease     hypothyroid  . High cholesterol   . Fibroid   . Arthritis   . Breast cancer 01/24/13    right, inv mammary  . Hx of radiation therapy 08/23/13- 10/05/13    right breast 5000 cGy 25 sessions, right breast boost 1000 cGy 5 sessions  . Hypothyroidism   . History of hiatal hernia   . History of blood transfusion   . History of urinary tract infection   . History of  chemotherapy   . Pain     left shoulder radiating down left arm and hand / numbness and tingling pt states MD has informed her that it is related to neck issues  . Cervical radiculopathy   . Trochanteric bursitis of right hip     Assessment/Plan: 1 Day Post-Op Procedure(s) (LRB): LEFT TOTAL HIP ARTHROPLASTY ANTERIOR APPROACH (Left) Principal Problem:   OA (osteoarthritis) of hip  Estimated body mass index is 27.36 kg/(m^2) as calculated from the following:   Height as of this encounter: 5\' 4"  (1.626 m).   Weight as of this encounter: 72.349 kg (159 lb 8 oz). Advance diet Up with therapy D/C IV fluids when tolerating POs well  DVT Prophylaxis - Xarelto Weight-Bearing as tolerated  D/C O2 and Pulse OX and try on Room Air  Will keep IV until patient's nausea is resolved. Plan for DC home tomorrow.   Ardeen Jourdain, PA-C Orthopaedic Surgery 12/21/2014, 7:58 AM

## 2014-12-21 NOTE — Care Management Note (Addendum)
Case Management Note  Patient Details  Name: Shannon Grant MRN: [id number] Date of Birth: May 07, 1945  Subjective/Objective:                   LEFT TOTAL HIP ARTHROPLASTY ANTERIOR APPROACH (Left) Action/Plan: Discharge planning  Expected Discharge Date:  12/22/14               Expected Discharge Plan:  [place]  In-House Referral:     Discharge planning Services  CM Consult  Post Acute Care Choice:  Home Health Choice offered to:  Patient  DME Arranged:  N/A DME Agency:     HH Arranged:  PT HH Agency:  [facility]  Status of Service:  Completed, signed off  Medicare Important Message Given:    Date Medicare IM Given:    Medicare IM give by:    Date Additional Medicare IM Given:    Additional Medicare Important Message give by:     If discussed at [place] of Stay Meetings, dates discussed:    Additional Comments: CM met with pt in room to offer choice of home health agency.  Pt chooses [facility] to render HHPT.  Pt has DME at home.  Address and contact information verified by pt.  Referral given to [facility] rep, Tim (on unit).  No other CM needs were communicated.   Dellie Catholic, RN 12/21/2014, 12:53 PM

## 2014-12-21 NOTE — Evaluation (Signed)
Occupational Therapy Evaluation Patient Details Name: Shannon Grant MRN: [id number] DOB: 17-Feb-1946 Today's Date: 12/21/2014    History of Present Illness Pt is s/p L THR- DA on 12/20/14. Pt with PMH of R [id number], post approach)   Clinical Impression   Pt up with min assist and walker to transfer on and off comfort height commode. Pt needs increased time for processing some commands and cues for safe use of walker. She has a friend staying with her at d/c to help for awhile. Will follow on acute to progress ADL independence for return home.    Follow Up Recommendations  No OT follow up;Supervision/Assistance - 24 hour    Equipment Recommendations  None recommended by OT    Recommendations for Other Services       Precautions / Restrictions Precautions Precautions: Fall Restrictions Weight Bearing Restrictions: Yes LLE Weight Bearing: Weight bearing as tolerated      Mobility Bed Mobility      General bed mobility comments: in chair  Transfers Overall transfer level: Needs assistance Equipment used: Rolling walker (2 wheeled) Transfers: Sit to/from Stand Sit to Stand: Min assist         General transfer comment: cues for hand placement. increased time to process information given.    Balance                                            ADL Overall ADL's : Needs assistance/impaired Eating/Feeding: Independent;Sitting   Grooming: Wash/dry hands;Minimal assistance;Standing   Upper Body Bathing: Set up;Sitting   Lower Body Bathing: Moderate assistance;Sit to/from stand   Upper Body Dressing : Set up;Sitting   Lower Body Dressing: Moderate assistance;Sit to/from stand   Toilet Transfer: Minimal assistance;Ambulation;RW;Comfort height toilet;Grab bars   Toileting- Clothing Manipulation and Hygiene: Minimal assistance;Sit to/from stand         General ADL Comments: Pt states she did not use a 3in1 last time she had hip surgery and  wants to be able to use her regular commode wtih vanity beside. Pt appeared confused when given cues to use grab bar (similar to using vaniety) and required increased time to let go of walker and reach for bar. Appears to have some difficulty with simple instructions at times. She tends to let go of walker and reach for door frame to step away from sink--gave cues to use her walker to turn and exit bathroom. SHe also tens to stand impulsively before OT ready.      Vision     Perception     Praxis      Pertinent Vitals/Pain Pain Assessment: 0-10 Pain Score: 3  Pain Location: L hip Pain Descriptors / Indicators: Aching Pain Intervention(s): Repositioned;Ice applied     Hand Dominance     Extremity/Trunk Assessment Upper Extremity Assessment Upper Extremity Assessment: Overall WFL for tasks assessed      Cervical / Trunk Assessment Cervical / Trunk Assessment: Normal   Communication Communication Communication: No difficulties   Cognition Arousal/Alertness: Awake/alert Behavior During Therapy: WFL for tasks assessed/performed Overall Cognitive Status: No family/caregiver present to determine baseline cognitive functioning (pt is slow to process information and confused with simple commands at times. Not sure of baseline or if due to current meds)                     General Comments  Exercises Exercises: Total Joint     Shoulder Instructions      Home Living Family/patient expects to be discharged to:: Private residence Living Arrangements: Alone Available Help at Discharge: Family;Friend(s) Type of Home: House Home Access: Stairs to enter [employer] of Steps: 1+1   Home Layout: Two level Alternate Level Stairs-Number of Steps: 14 Alternate Level Stairs-Rails: Right Bathroom Shower/Tub: Occupational psychologist: Standard     Home Equipment: Environmental consultant - 2 wheels;Cane - single point;Crutches;Adaptive equipment Adaptive Equipment:  Long-handled sponge;Long-handled shoe horn Additional Comments: pt checking to see if she still has a shower chair.       Prior Functioning/Environment Level of Independence: Independent             OT Diagnosis: Generalized weakness   OT Problem List: Decreased strength;Decreased knowledge of use of DME or AE;Decreased safety awareness   OT Treatment/Interventions: Self-care/ADL training;Patient/family education;Therapeutic activities;DME and/or AE instruction    OT Goals(Current goals can be found in the care plan section) Acute Rehab OT Goals Patient Stated Goal: Home OT Goal Formulation: With patient Time For Goal Achievement: 12/28/14 Potential to Achieve Goals: Good  OT Frequency: Min 2X/week   Barriers to D/C:            Co-evaluation              End of Session Equipment Utilized During Treatment: Rolling walker  Activity Tolerance: Patient tolerated treatment well Patient left: in chair;with call bell/phone within reach;with family/visitor present   Time: 4580-9983 OT Time Calculation (min): 27 min Charges:  OT General Charges $OT Visit: 1 Procedure OT Evaluation $Initial OT Evaluation Tier I: 1 Procedure OT Treatments $Therapeutic Activity: 8-22 mins G-Codes:    Jules Schick  [phone] 12/21/2014, 12:00 PM

## 2014-12-22 LAB — BASIC METABOLIC PANEL
ANION GAP: 6 (ref 5–15)
BUN: 8 mg/dL (ref 6–20)
CALCIUM: 9.1 mg/dL (ref 8.9–10.3)
CO2: 27 mmol/L (ref 22–32)
CREATININE: 0.58 mg/dL (ref 0.44–1.00)
Chloride: 108 mmol/L (ref 101–111)
GFR calc Af Amer: >60 mL/min (ref >60)
GLUCOSE: 119 mg/dL — AB (ref 65–99)
Potassium: 3.6 mmol/L (ref 3.5–5.1)
Sodium: 141 mmol/L (ref 135–145)

## 2014-12-22 LAB — CBC
HCT: 32.8 % — ABNORMAL LOW (ref 36.0–46.0)
Hemoglobin: 11.1 g/dL — ABNORMAL LOW (ref 12.0–15.0)
MCH: 32.5 pg (ref 26.0–34.0)
MCHC: 33.8 g/dL (ref 30.0–36.0)
MCV: 95.9 fL (ref 78.0–100.0)
PLATELETS: 196 10*3/uL (ref 150–400)
RBC: 3.42 MIL/uL — ABNORMAL LOW (ref 3.87–5.11)
RDW: 13.5 % (ref 11.5–15.5)
WBC: 10.8 10*3/uL — AB (ref 4.0–10.5)

## 2014-12-22 NOTE — Progress Notes (Signed)
Bed alarm activated.  RN arrived to patients room, patient in restroom without walker.  Patient states she couldn't find the number to call. RN assisted patient back to bed, re-educated call bell system, importance of requesting assistance when getting out of bed at all times and use of walker at all times.  Patient verbalized understanding.  Bed alarm reset.

## 2014-12-22 NOTE — Progress Notes (Signed)
Physical Therapy Treatment Patient Details Name: Shannon Grant MRN: [id number] DOB: December 03, 1945 Today's Date: 12/22/2014    History of Present Illness Pt is s/p L THR- DA on 12/20/14. Pt with PMH of R [id number], post approach)    PT Comments    Pt motivated and progressing well with mobility.  Reviewed therex, car transfers and stairs with pt.  Written instructions provided.    Follow Up Recommendations  Home health PT     Equipment Recommendations  None recommended by PT    Recommendations for Other Services OT consult     Precautions / Restrictions Precautions Precautions: Fall Restrictions Weight Bearing Restrictions: No LLE Weight Bearing: Weight bearing as tolerated    Mobility  Bed Mobility Overal bed mobility: Needs Assistance Bed Mobility: Supine to Sit     Supine to sit: Min assist     General bed mobility comments: cues for sequence and use of R LE to self assist  Transfers Overall transfer level: Needs assistance Equipment used: Rolling walker (2 wheeled) Transfers: Sit to/from Stand Sit to Stand: Supervision Stand pivot transfers: Supervision       General transfer comment: cues for hand placement. increased time to process information given.  Ambulation/Gait Ambulation/Gait assistance: Min guard;Supervision Ambulation Distance (Feet): 150 Feet Assistive device: Rolling walker (2 wheeled) Gait Pattern/deviations: Step-to pattern;Step-through pattern;Decreased step length - right;Decreased step length - left;Shuffle;Trunk flexed Gait velocity: decr   General Gait Details: cues for posture and position from RW   Stairs Stairs: Yes Stairs assistance: Min assist Stair Management: No rails;One rail Left;Step to pattern;Forwards;With walker;With cane Number of Stairs: 6 General stair comments: single step twice with RW, 4 steps with rail and cane; min cues for sequence and cane/RW/foot placement  Wheelchair Mobility    Modified Rankin (Stroke  Patients Only)       Balance                                    Cognition Arousal/Alertness: Awake/alert Behavior During Therapy: Anxious Overall Cognitive Status: No family/caregiver present to determine baseline cognitive functioning       Memory: Decreased short-term memory              Exercises Total Joint Exercises Ankle Circles/Pumps: AROM;Both;15 reps;Supine Quad Sets: AROM;Both;10 reps;Supine Heel Slides: AAROM;Left;20 reps;Supine Hip ABduction/ADduction: AAROM;Left;15 reps;Supine    General Comments        Pertinent Vitals/Pain Pain Assessment: 0-10 Pain Score: 5  Pain Location: L hip and thigh Pain Descriptors / Indicators: Aching;Sore Pain Intervention(s): Limited activity within patient's tolerance;Monitored during session;Premedicated before session    Home Living                      Prior Function            PT Goals (current goals can now be found in the care plan section) Acute Rehab PT Goals Patient Stated Goal: Home PT Goal Formulation: With patient Time For Goal Achievement: 12/26/14 Potential to Achieve Goals: Good Progress towards PT goals: Progressing toward goals    Frequency  7X/week    PT Plan Current plan remains appropriate    Co-evaluation             End of Session Equipment Utilized During Treatment: Gait belt Activity Tolerance: Patient tolerated treatment well Patient left: in chair;with call bell/phone within reach     Time: [id number] PT Time  Calculation (min) (ACUTE ONLY): 45 min  Charges:  $Gait Training: 8-22 mins $Therapeutic Exercise: 8-22 mins $Therapeutic Activity: 8-22 mins                    G Codes:      BRADSHAW,HUNTER 01/03/2015, 10:56 AM

## 2014-12-22 NOTE — Progress Notes (Signed)
   Subjective: 2 Days Post-Op Procedure(s) (LRB): LEFT TOTAL HIP ARTHROPLASTY ANTERIOR APPROACH (Left) Patient reports pain as mild.   Patient seen in rounds with Dr. Wynelle Link. Patient is well, and has had no acute complaints or problems. Reports some drowsiness and confusion secondary to the medication. No SOB or chest pain.   Objective: Vital signs in last 24 hours: Temp:  [97.8 F (36.6 C)-100.4 F (38 C)] 98.9 F (37.2 C) (09/23 0549) Pulse Rate:  [60-72] 68 (09/23 0549) Resp:  [15-16] 16 (09/23 0549) BP: (119-159)/(56-75) 136/68 mmHg (09/23 0549) SpO2:  [90 %-100 %] 96 % (09/23 0549)  Intake/Output from previous day:  Intake/Output Summary (Last 24 hours) at 12/22/14 0802 Last data filed at 12/22/14 0801  Gross per 24 hour  Intake 1767.5 ml  Output   2450 ml  Net -682.5 ml    Intake/Output this shift: Total I/O In: 240 [P.O.:240] Out: -   Labs:  Recent Labs  12/21/14 0505 12/22/14 0455  HGB 11.4* 11.1*    Recent Labs  12/21/14 0505 12/22/14 0455  WBC 7.6 10.8*  RBC 3.55* 3.42*  HCT 34.0* 32.8*  PLT 166 196    Recent Labs  12/21/14 0505 12/22/14 0455  NA 137 141  K 3.9 3.6  CL 104 108  CO2 25 27  BUN 9 8  CREATININE 0.70 0.58  GLUCOSE 162* 119*  CALCIUM 8.9 9.1    EXAM General - Patient is Alert and Oriented Extremity - Neurologically intact Intact pulses distally Dorsiflexion/Plantar flexion intact Compartment soft Dressing/Incision - clean, dry, no drainage Motor Function - intact, moving foot and toes well on exam.   Past Medical History  Diagnosis Date  . Hypertension   . Thyroid disease     hypothyroid  . High cholesterol   . Fibroid   . Arthritis   . Breast cancer 01/24/13    right, inv mammary  . Hx of radiation therapy 08/23/13- 10/05/13    right breast 5000 cGy 25 sessions, right breast boost 1000 cGy 5 sessions  . Hypothyroidism   . History of hiatal hernia   . History of blood transfusion   . History of urinary  tract infection   . History of chemotherapy   . Pain     left shoulder radiating down left arm and hand / numbness and tingling pt states MD has informed her that it is related to neck issues  . Cervical radiculopathy   . Trochanteric bursitis of right hip     Assessment/Plan: 2 Days Post-Op Procedure(s) (LRB): LEFT TOTAL HIP ARTHROPLASTY ANTERIOR APPROACH (Left) Principal Problem:   OA (osteoarthritis) of hip  Estimated body mass index is 27.36 kg/(m^2) as calculated from the following:   Height as of this encounter: 5\' 4"  (1.626 m).   Weight as of this encounter: 72.349 kg (159 lb 8 oz). Advance diet Up with therapy D/C IV fluids Discharge home with home health  DVT Prophylaxis - Xarelto Weight Bearing As Tolerated   Doing well but will discourage oxycodone today due to confusion. Plan for DC home today with [place], PA-C Orthopaedic Surgery 12/22/2014, 8:02 AM

## 2014-12-22 NOTE — Progress Notes (Signed)
Occupational Therapy Treatment Patient Details Name: Shannon Grant MRN: [id number] DOB: [birth year] Today's Date: 12/22/2014    History of present illness Pt is s/p L THR- DA on 12/20/14. Pt with PMH of R [id number], post approach)   OT comments  Provided instruction and demonstration for safe shower stall transfer.  Pt. Able to return demo and reports she will have family friend and daughter assisting at home.  Still requiring cues for safety and technique.  Pt. Remains confused and requires increased time to process instructions.   Eager for d/c later today.    Follow Up Recommendations  No OT follow up;Supervision/Assistance - 24 hour    Equipment Recommendations  None recommended by OT    Recommendations for Other Services      Precautions / Restrictions Precautions Precautions: Fall Restrictions Weight Bearing Restrictions: Yes LLE Weight Bearing: Weight bearing as tolerated       Mobility Bed Mobility Overal bed mobility: Needs Assistance Bed Mobility: Sit to Supine           General bed mobility comments: cues for sequence and use of R LE to self assist  Transfers Overall transfer level: Needs assistance Equipment used: Rolling walker (2 wheeled) Transfers: Sit to/from [facility] Sit to Stand: Supervision Stand pivot transfers: Supervision       General transfer comment: cues for hand placement. increased time to process information given.    Balance                                   ADL Overall ADL's : Needs assistance/impaired     Grooming: Wash/dry hands;Supervision/safety;Standing         Lower Body Bathing Details (indicate cue type and reason): reports she is having a friend stay with here and also has a dtr that will be assisting       Lower Body Dressing Details (indicate cue type and reason): reports she is having a friend stay with here and also has a dtr that will be assisting Toilet Transfer:  Supervision/safety;Cueing for sequencing;Cueing for safety;Ambulation;Regular Toilet;Grab bars;RW   Toileting- Water quality scientist and Hygiene: Min guard;Sit to/from stand Toileting - Clothing Manipulation Details (indicate cue type and reason): sim. during transfers in room Tub/ Shower Transfer: Walk-in shower;Minimal assistance;Cueing for safety;Cueing for sequencing;Anterior/posterior;Ambulation;Rolling walker Tub/Shower Transfer Details (indicate cue type and reason): reports no grab bars at home, will have assistance for transfer.  educated on holding onto walls as she works her way in/out of the shower stall Functional mobility during ADLs: Supervision/safety General ADL Comments: pt. with notable confusion.  asking same questions over and over.  verbalizes undertanding but would then repeat the question again.  safe return demo of shower stall transfer. educated on not attempting this transfer with out family/friend present.  pt. verbalized understanding      Vision                     Perception     Praxis      Cognition   Behavior During Therapy: Anxious Overall Cognitive Status:  (confusion, noted during session, required multiple explanations and answers to same questions over and over)       Memory: Decreased short-term memory               Extremity/Trunk Assessment               Exercises  Shoulder Instructions       General Comments      Pertinent Vitals/ Pain       Pain Assessment: 0-10 Pain Score: 5  Pain Location: L hip Pain Descriptors / Indicators: Aching;Sore Pain Intervention(s): Monitored during session;Premedicated before session;Repositioned  Home Living                                          Prior Functioning/Environment              Frequency Min 2X/week     Progress Toward Goals  OT Goals(current goals can now be found in the care plan section)  Progress towards OT goals: Progressing  toward goals     Plan Discharge plan remains appropriate    Co-evaluation                 End of Session Equipment Utilized During Treatment: Rolling walker   Activity Tolerance Patient tolerated treatment well   Patient Left in bed;with call bell/phone within reach;with bed alarm set   Nurse Communication          Time: [phone] OT Time Calculation (min): 17 min  Charges: OT General Charges $OT Visit: 1 Procedure OT Treatments $Self Care/Home Management : 8-22 mins  Janice Coffin, COTA/L 12/22/2014, 9:38 AM

## 2014-12-23 DIAGNOSIS — Z471 Aftercare following joint replacement surgery: Secondary | ICD-10-CM | POA: Diagnosis not present

## 2014-12-23 DIAGNOSIS — M170 Bilateral primary osteoarthritis of knee: Secondary | ICD-10-CM | POA: Diagnosis not present

## 2014-12-23 DIAGNOSIS — G609 Hereditary and idiopathic neuropathy, unspecified: Secondary | ICD-10-CM | POA: Diagnosis not present

## 2014-12-23 DIAGNOSIS — M5412 Radiculopathy, cervical region: Secondary | ICD-10-CM | POA: Diagnosis not present

## 2014-12-23 DIAGNOSIS — I10 Essential (primary) hypertension: Secondary | ICD-10-CM | POA: Diagnosis not present

## 2014-12-23 DIAGNOSIS — Z96643 Presence of artificial hip joint, bilateral: Secondary | ICD-10-CM | POA: Diagnosis not present

## 2014-12-25 DIAGNOSIS — G609 Hereditary and idiopathic neuropathy, unspecified: Secondary | ICD-10-CM | POA: Diagnosis not present

## 2014-12-25 DIAGNOSIS — M170 Bilateral primary osteoarthritis of knee: Secondary | ICD-10-CM | POA: Diagnosis not present

## 2014-12-25 DIAGNOSIS — Z96643 Presence of artificial hip joint, bilateral: Secondary | ICD-10-CM | POA: Diagnosis not present

## 2014-12-25 DIAGNOSIS — I10 Essential (primary) hypertension: Secondary | ICD-10-CM | POA: Diagnosis not present

## 2014-12-25 DIAGNOSIS — Z471 Aftercare following joint replacement surgery: Secondary | ICD-10-CM | POA: Diagnosis not present

## 2014-12-25 DIAGNOSIS — M5412 Radiculopathy, cervical region: Secondary | ICD-10-CM | POA: Diagnosis not present

## 2014-12-25 NOTE — Discharge Summary (Signed)
Physician Discharge Summary   Patient ID: Shannon Grant MRN: [id number] DOB/AGE: May 20, 1945 69 y.o.  Admit date: 12/20/2014 Discharge date: 12/22/2014  Primary Diagnosis: Primary osteoarthritis left hip  Admission Diagnoses:  Past Medical History  Diagnosis Date  . Hypertension   . Thyroid disease     hypothyroid  . High cholesterol   . Fibroid   . Arthritis   . Breast cancer 01/24/13    right, inv mammary  . Hx of radiation therapy 08/23/13- 10/05/13    right breast 5000 cGy 25 sessions, right breast boost 1000 cGy 5 sessions  . Hypothyroidism   . History of hiatal hernia   . History of blood transfusion   . History of urinary tract infection   . History of chemotherapy   . Pain     left shoulder radiating down left arm and hand / numbness and tingling pt states MD has informed her that it is related to neck issues  . Cervical radiculopathy   . Trochanteric bursitis of right hip    Discharge Diagnoses:   Principal Problem:   OA (osteoarthritis) of hip  Estimated body mass index is 27.36 kg/(m^2) as calculated from the following:   Height as of this encounter: $RemoveBeforeD'5\' 4"'[id number]$  (1.626 m).   Weight as of this encounter: 72.349 kg (159 lb 8 oz).  Procedure:  Procedure(s) (LRB): LEFT TOTAL HIP ARTHROPLASTY ANTERIOR APPROACH (Left)   Consults: None  HPI: The patient is a 69 year old female who presents for a left total hip arthroplasty.  The patient is being followed for their left hip pain and osteoarthritis. They are over 2 year(s) out from IA injection w/ Dr. Nelva Bush which did not provide any relief of symptoms. Symptoms reported include: pain (constant, that increases with exercise), aching, stiffness, giving way, instability, pain with weightbearing, difficulty ambulating (and going up and down stairs) and difficulty arising from chair. The patient feels that they are doing poorly and report their pain level to be moderate to severe (upon flexion and ambulation). Current treatment  includes: activity modification. The following medication has been used for pain control: antiinflammatory medication (Ibuprofen). The patient has reported not much improvement of their symptoms with: activity modification and heat. Unfortunately, her left hip is getting progressively worse over time. She had documented bone-on-bone arthritis a few years ago, had intraarticular injection with Dr. Nelva Bush, which helped for a short amount of time. She had other health issues since then and is now at a stage where she can have the hip addressed. She has had her right hip replaced and did great with that. The left hip is now at a stage where she feels like it needs to be fixed because of the extent of pain she is having. She is ready to get the hip replaced at this time. They have been treated conservatively in the past for the above stated problem and despite conservative measures, they continue to have progressive pain and severe functional limitations and dysfunction. They have failed non-operative management including home exercise, medications, and injections. It is felt that they would benefit from undergoing total joint replacement. Risks and benefits of the procedure have been discussed with the patient and they elect to proceed with surgery. There are no active contraindications to surgery such as ongoing infection or rapidly progressive neurological disease.  Laboratory Data: Admission on 12/20/2014, Discharged on 12/22/2014  Component Date Value Ref Range Status  . WBC 12/21/2014 7.6  4.0 - 10.5 K/uL Final  . RBC  12/21/2014 3.55* 3.87 - 5.11 MIL/uL Final  . Hemoglobin 12/21/2014 11.4* 12.0 - 15.0 g/dL Final  . HCT 12/21/2014 34.0* 36.0 - 46.0 % Final  . MCV 12/21/2014 95.8  78.0 - 100.0 fL Final  . MCH 12/21/2014 32.1  26.0 - 34.0 pg Final  . MCHC 12/21/2014 33.5  30.0 - 36.0 g/dL Final  . RDW 12/21/2014 13.1  11.5 - 15.5 % Final  . Platelets 12/21/2014 166  150 - 400 K/uL Final  . Sodium  12/21/2014 137  135 - 145 mmol/L Final  . Potassium 12/21/2014 3.9  3.5 - 5.1 mmol/L Final  . Chloride 12/21/2014 104  101 - 111 mmol/L Final  . CO2 12/21/2014 25  22 - 32 mmol/L Final  . Glucose, Bld 12/21/2014 162* 65 - 99 mg/dL Final  . BUN 12/21/2014 9  6 - 20 mg/dL Final  . Creatinine, Ser 12/21/2014 0.70  0.44 - 1.00 mg/dL Final  . Calcium 12/21/2014 8.9  8.9 - 10.3 mg/dL Final  . GFR calc non Af Amer 12/21/2014 >60  >60 mL/min Final  . GFR calc Af Amer 12/21/2014 >60  >60 mL/min Final   Comment: (NOTE) The eGFR has been calculated using the CKD EPI equation. This calculation has not been validated in all clinical situations. eGFR's persistently <60 mL/min signify possible Chronic Kidney Disease.   . Anion gap 12/21/2014 8  5 - 15 Final  . WBC 12/22/2014 10.8* 4.0 - 10.5 K/uL Final  . RBC 12/22/2014 3.42* 3.87 - 5.11 MIL/uL Final  . Hemoglobin 12/22/2014 11.1* 12.0 - 15.0 g/dL Final  . HCT 12/22/2014 32.8* 36.0 - 46.0 % Final  . MCV 12/22/2014 95.9  78.0 - 100.0 fL Final  . MCH 12/22/2014 32.5  26.0 - 34.0 pg Final  . MCHC 12/22/2014 33.8  30.0 - 36.0 g/dL Final  . RDW 12/22/2014 13.5  11.5 - 15.5 % Final  . Platelets 12/22/2014 196  150 - 400 K/uL Final  . Sodium 12/22/2014 141  135 - 145 mmol/L Final  . Potassium 12/22/2014 3.6  3.5 - 5.1 mmol/L Final  . Chloride 12/22/2014 108  101 - 111 mmol/L Final  . CO2 12/22/2014 27  22 - 32 mmol/L Final  . Glucose, Bld 12/22/2014 119* 65 - 99 mg/dL Final  . BUN 12/22/2014 8  6 - 20 mg/dL Final  . Creatinine, Ser 12/22/2014 0.58  0.44 - 1.00 mg/dL Final  . Calcium 12/22/2014 9.1  8.9 - 10.3 mg/dL Final  . GFR calc non Af Amer 12/22/2014 >60  >60 mL/min Final  . GFR calc Af Amer 12/22/2014 >60  >60 mL/min Final   Comment: (NOTE) The eGFR has been calculated using the CKD EPI equation. This calculation has not been validated in all clinical situations. eGFR's persistently <60 mL/min signify possible Chronic Kidney Disease.   Georgiann Hahn gap 12/22/2014 6  5 - 15 Final  Hospital Outpatient Visit on 12/13/2014  Component Date Value Ref Range Status  . MRSA, PCR 12/13/2014 NEGATIVE  NEGATIVE Final  . Staphylococcus aureus 12/13/2014 NEGATIVE  NEGATIVE Final   Comment:        The Xpert SA Assay (FDA approved for NASAL specimens in patients over 56 years of age), is one component of a comprehensive surveillance program.  Test performance has been validated by [facility] for patients greater than or equal to 4 year old. It is not intended to diagnose infection nor to guide or monitor treatment.   Marland Kitchen aPTT 12/13/2014 29  24 - 37 seconds Final  . WBC 12/13/2014 5.5  4.0 - 10.5 K/uL Final  . RBC 12/13/2014 4.40  3.87 - 5.11 MIL/uL Final  . Hemoglobin 12/13/2014 14.1  12.0 - 15.0 g/dL Final  . HCT 12/13/2014 41.9  36.0 - 46.0 % Final  . MCV 12/13/2014 95.2  78.0 - 100.0 fL Final  . MCH 12/13/2014 32.0  26.0 - 34.0 pg Final  . MCHC 12/13/2014 33.7  30.0 - 36.0 g/dL Final  . RDW 12/13/2014 13.3  11.5 - 15.5 % Final  . Platelets 12/13/2014 214  150 - 400 K/uL Final  . Sodium 12/13/2014 140  135 - 145 mmol/L Final  . Potassium 12/13/2014 4.3  3.5 - 5.1 mmol/L Final  . Chloride 12/13/2014 103  101 - 111 mmol/L Final  . CO2 12/13/2014 28  22 - 32 mmol/L Final  . Glucose, Bld 12/13/2014 100* 65 - 99 mg/dL Final  . BUN 12/13/2014 12  6 - 20 mg/dL Final  . Creatinine, Ser 12/13/2014 0.65  0.44 - 1.00 mg/dL Final  . Calcium 12/13/2014 10.4* 8.9 - 10.3 mg/dL Final  . Total Protein 12/13/2014 7.1  6.5 - 8.1 g/dL Final  . Albumin 12/13/2014 4.9  3.5 - 5.0 g/dL Final  . AST 12/13/2014 27  15 - 41 U/L Final  . ALT 12/13/2014 21  14 - 54 U/L Final  . Alkaline Phosphatase 12/13/2014 52  38 - 126 U/L Final  . Total Bilirubin 12/13/2014 0.8  0.3 - 1.2 mg/dL Final  . GFR calc non Af Amer 12/13/2014 >60  >60 mL/min Final  . GFR calc Af Amer 12/13/2014 >60  >60 mL/min Final   Comment: (NOTE) The eGFR has been calculated using the  CKD EPI equation. This calculation has not been validated in all clinical situations. eGFR's persistently <60 mL/min signify possible Chronic Kidney Disease.   . Anion gap 12/13/2014 9  5 - 15 Final  . Prothrombin Time 12/13/2014 13.3  11.6 - 15.2 seconds Final  . INR 12/13/2014 0.99  0.00 - 1.49 Final  . ABO/RH(D) 12/13/2014 O POS   Final  . Antibody Screen 12/13/2014 NEG   Final  . Sample Expiration 12/13/2014 12/23/2014   Final  . Color, Urine 12/13/2014 YELLOW  YELLOW Final  . APPearance 12/13/2014 CLEAR  CLEAR Final  . Specific Gravity, Urine 12/13/2014 1.018  1.005 - 1.030 Final  . pH 12/13/2014 6.0  5.0 - 8.0 Final  . Glucose, UA 12/13/2014 NEGATIVE  NEGATIVE mg/dL Final  . Hgb urine dipstick 12/13/2014 NEGATIVE  NEGATIVE Final  . Bilirubin Urine 12/13/2014 SMALL* NEGATIVE Final  . Ketones, ur 12/13/2014 NEGATIVE  NEGATIVE mg/dL Final  . Protein, ur 12/13/2014 NEGATIVE  NEGATIVE mg/dL Final  . Urobilinogen, UA 12/13/2014 0.2  0.0 - 1.0 mg/dL Final  . Nitrite 12/13/2014 NEGATIVE  NEGATIVE Final  . Leukocytes, UA 12/13/2014 NEGATIVE  NEGATIVE Final   MICROSCOPIC NOT DONE ON URINES WITH NEGATIVE PROTEIN, BLOOD, LEUKOCYTES, NITRITE, OR GLUCOSE <1000 mg/dL.     X-Rays:Dg Pelvis Portable  12/20/2014   CLINICAL DATA:  Osteoarthritis of the left hip. Status post left total hip replacement.  EXAM: PORTABLE PELVIS 1-2 VIEWS  COMPARISON:  None.  FINDINGS: Left hip prosthesis appears in good position. Soft tissue drain in place. No fractures. Right hip prosthesis also in place.  Calcified fibroid in the pelvis.  Bowel gas pattern in the pelvis is normal.  IMPRESSION: Satisfactory appearance of the left hip and pelvis in the AP projection  after left total hip prosthesis insertion.   Electronically Signed   By: Lorriane Shire M.D.   On: 12/20/2014 18:09   Dg C-arm 1-60 Min-no Report  12/20/2014   CLINICAL DATA: left anterior hip   C-ARM 1-60 MINUTES  Fluoroscopy was utilized by the requesting  physician.  No radiographic  interpretation.     EKG: Orders placed or performed during the hospital encounter of 12/13/14  . EKG 12-Lead  . EKG 12-Lead     Hospital Course: CATHY CROUNSE is a 69 y.o. who was admitted to [facility]. They were brought to the operating room on 12/20/2014 and underwent Procedure(s): LEFT TOTAL HIP ARTHROPLASTY ANTERIOR APPROACH.  Patient tolerated the procedure well and was later transferred to the recovery room and then to the orthopaedic floor for postoperative care.  They were given PO and IV analgesics for pain control following their surgery.  They were given 24 hours of postoperative antibiotics of  Anti-infectives    Start     Dose/Rate Route Frequency Ordered Stop   12/21/14 0600  ceFAZolin (ANCEF) IVPB 2 g/50 mL premix     2 g 100 mL/hr over 30 Minutes Intravenous On call to O.R. 12/20/14 1337 12/20/14 1612   12/20/14 2230  ceFAZolin (ANCEF) IVPB 2 g/50 mL premix     2 g 100 mL/hr over 30 Minutes Intravenous Every 6 hours 12/20/14 2029 12/21/14 0555     and started on DVT prophylaxis in the form of Xarelto.   PT and OT were ordered for total joint protocol.  Discharge planning consulted to help with postop disposition and equipment needs.  Patient had a good night on the evening of surgery.  They started to get up OOB WBAT with therapy on day one. Hemovac drain was pulled without difficulty.  Continued to work with therapy into day two.  Dressing was changed on day two and the incision was clean and dry.  She had some confusion secondary to pain medication so narcotics were held.  Patient was seen in rounds and was ready to go home.   Diet: Cardiac diet Activity:WBAT Follow-up:in 2 weeks Disposition - Home Discharged Condition: stable   Discharge Instructions    Call MD / Call 911    Complete by:  As directed   If you experience chest pain or shortness of breath, CALL 911 and be transported to the hospital emergency room.  If you  develope a fever above 101 F, pus (white drainage) or increased drainage or redness at the wound, or calf pain, call your surgeon's office.     Constipation Prevention    Complete by:  As directed   Drink plenty of fluids.  Prune juice may be helpful.  You may use a stool softener, such as Colace (over the counter) 100 mg twice a day.  Use MiraLax (over the counter) for constipation as needed.     Diet - low sodium heart healthy    Complete by:  As directed      Discharge instructions    Complete by:  As directed   INSTRUCTIONS AFTER JOINT REPLACEMENT   DRESSING / WOUND CARE / SHOWERING You may change your dressing 3-5 days after surgery.  Then change the dressing every day with sterile gauze.  Please use good hand washing techniques before changing the dressing.  Do not use any lotions or creams on the incision until instructed by your surgeon  WEIGHT BEARING  Weight bearing as tolerated with assist device (  walker, cane, etc) as directed, use it as long as suggested by your surgeon or therapist, typically at least 4-6 weeks.     Increase activity slowly as tolerated    Complete by:  As directed             Medication List    STOP taking these medications        Coenzyme Q10 300 MG Caps     ibuprofen 200 MG tablet  Commonly known as:  ADVIL,MOTRIN     multivitamin tablet      TAKE these medications        HYDROmorphone 2 MG tablet  Commonly known as:  DILAUDID  Take 1-2 tablets (2-4 mg total) by mouth every 4 (four) hours as needed for severe pain.     levothyroxine 112 MCG tablet  Commonly known as:  SYNTHROID, LEVOTHROID  Take 112 mcg by mouth daily before breakfast.     lisinopril 10 MG tablet  Commonly known as:  PRINIVIL,ZESTRIL  Take 1 tablet (10 mg total) by mouth daily.     methocarbamol 500 MG tablet  Commonly known as:  ROBAXIN  Take 1 tablet (500 mg total) by mouth every 6 (six) hours as needed for muscle spasms.     pravastatin 10 MG tablet  Commonly  known as:  PRAVACHOL  Take 10 mg by mouth every morning.     rivaroxaban 10 MG Tabs tablet  Commonly known as:  XARELTO  Take 1 tablet (10 mg total) by mouth daily with breakfast.     traMADol 50 MG tablet  Commonly known as:  ULTRAM  Take 1-2 tablets (50-100 mg total) by mouth every 6 (six) hours as needed for moderate pain.           Follow-up Information    Follow up with Gearlean Alf, MD. Schedule an appointment as soon as possible for a visit on 01/02/2015.   Specialty:  Orthopedic Surgery   Why:  Call [phone] tomorrow to make the appointment   Contact information:   [address] [place] [address] [phone]       Follow up with [facility].   Why:  home health physical therapy   Contact information:   [address] [place] [address] [phone]       Signed: Ardeen Jourdain, PA-C Orthopaedic Surgery 12/25/2014, 8:46 AM

## 2014-12-27 DIAGNOSIS — Z471 Aftercare following joint replacement surgery: Secondary | ICD-10-CM | POA: Diagnosis not present

## 2014-12-27 DIAGNOSIS — G609 Hereditary and idiopathic neuropathy, unspecified: Secondary | ICD-10-CM | POA: Diagnosis not present

## 2014-12-27 DIAGNOSIS — M5412 Radiculopathy, cervical region: Secondary | ICD-10-CM | POA: Diagnosis not present

## 2014-12-27 DIAGNOSIS — M170 Bilateral primary osteoarthritis of knee: Secondary | ICD-10-CM | POA: Diagnosis not present

## 2014-12-27 DIAGNOSIS — Z96643 Presence of artificial hip joint, bilateral: Secondary | ICD-10-CM | POA: Diagnosis not present

## 2014-12-27 DIAGNOSIS — I10 Essential (primary) hypertension: Secondary | ICD-10-CM | POA: Diagnosis not present

## 2014-12-30 ENCOUNTER — Encounter (HOSPITAL_COMMUNITY): Payer: Self-pay

## 2014-12-30 ENCOUNTER — Observation Stay (HOSPITAL_COMMUNITY)
Admission: EM | Admit: 2014-12-30 | Discharge: 2015-01-02 | Disposition: A | Discharge status: Home / Self Care | Payer: Medicare Other | Attending: Internal Medicine | Admitting: Internal Medicine

## 2014-12-30 DIAGNOSIS — Z853 Personal history of malignant neoplasm of breast: Secondary | ICD-10-CM | POA: Insufficient documentation

## 2014-12-30 DIAGNOSIS — K5730 Diverticulosis of large intestine without perforation or abscess without bleeding: Secondary | ICD-10-CM | POA: Diagnosis not present

## 2014-12-30 DIAGNOSIS — C50411 Malignant neoplasm of upper-outer quadrant of right female breast: Secondary | ICD-10-CM | POA: Diagnosis not present

## 2014-12-30 DIAGNOSIS — Z9221 Personal history of antineoplastic chemotherapy: Secondary | ICD-10-CM | POA: Diagnosis not present

## 2014-12-30 DIAGNOSIS — Z87891 Personal history of nicotine dependence: Secondary | ICD-10-CM | POA: Insufficient documentation

## 2014-12-30 DIAGNOSIS — K625 Hemorrhage of anus and rectum: Secondary | ICD-10-CM | POA: Diagnosis present

## 2014-12-30 DIAGNOSIS — Z87440 Personal history of urinary (tract) infections: Secondary | ICD-10-CM | POA: Diagnosis not present

## 2014-12-30 DIAGNOSIS — M5412 Radiculopathy, cervical region: Secondary | ICD-10-CM | POA: Insufficient documentation

## 2014-12-30 DIAGNOSIS — Z888 Allergy status to other drugs, medicaments and biological substances status: Secondary | ICD-10-CM | POA: Diagnosis not present

## 2014-12-30 DIAGNOSIS — Z96643 Presence of artificial hip joint, bilateral: Secondary | ICD-10-CM | POA: Diagnosis not present

## 2014-12-30 DIAGNOSIS — Z881 Allergy status to other antibiotic agents status: Secondary | ICD-10-CM | POA: Diagnosis not present

## 2014-12-30 DIAGNOSIS — K640 First degree hemorrhoids: Secondary | ICD-10-CM | POA: Insufficient documentation

## 2014-12-30 DIAGNOSIS — M1612 Unilateral primary osteoarthritis, left hip: Secondary | ICD-10-CM | POA: Diagnosis not present

## 2014-12-30 DIAGNOSIS — E039 Hypothyroidism, unspecified: Secondary | ICD-10-CM | POA: Diagnosis not present

## 2014-12-30 DIAGNOSIS — Z7901 Long term (current) use of anticoagulants: Secondary | ICD-10-CM | POA: Diagnosis not present

## 2014-12-30 DIAGNOSIS — Z885 Allergy status to narcotic agent status: Secondary | ICD-10-CM | POA: Diagnosis not present

## 2014-12-30 DIAGNOSIS — K449 Diaphragmatic hernia without obstruction or gangrene: Secondary | ICD-10-CM | POA: Diagnosis not present

## 2014-12-30 DIAGNOSIS — K635 Polyp of colon: Secondary | ICD-10-CM | POA: Insufficient documentation

## 2014-12-30 DIAGNOSIS — I10 Essential (primary) hypertension: Secondary | ICD-10-CM | POA: Insufficient documentation

## 2014-12-30 DIAGNOSIS — E7800 Pure hypercholesterolemia, unspecified: Secondary | ICD-10-CM | POA: Diagnosis not present

## 2014-12-30 DIAGNOSIS — D638 Anemia in other chronic diseases classified elsewhere: Secondary | ICD-10-CM | POA: Diagnosis not present

## 2014-12-30 DIAGNOSIS — D649 Anemia, unspecified: Secondary | ICD-10-CM | POA: Diagnosis present

## 2014-12-30 DIAGNOSIS — D500 Iron deficiency anemia secondary to blood loss (chronic): Secondary | ICD-10-CM | POA: Diagnosis not present

## 2014-12-30 DIAGNOSIS — Z171 Estrogen receptor negative status [ER-]: Secondary | ICD-10-CM

## 2014-12-30 LAB — COMPREHENSIVE METABOLIC PANEL
ALT: 20 U/L (ref 14–54)
AST: 26 U/L (ref 15–41)
Albumin: 4.1 g/dL (ref 3.5–5.0)
Alkaline Phosphatase: 59 U/L (ref 38–126)
Anion gap: 5 (ref 5–15)
BUN: 10 mg/dL (ref 6–20)
CHLORIDE: 103 mmol/L (ref 101–111)
CO2: 29 mmol/L (ref 22–32)
CREATININE: 0.6 mg/dL (ref 0.44–1.00)
Calcium: 9.7 mg/dL (ref 8.9–10.3)
Glucose, Bld: 101 mg/dL — ABNORMAL HIGH (ref 65–99)
POTASSIUM: 4 mmol/L (ref 3.5–5.1)
SODIUM: 137 mmol/L (ref 135–145)
Total Bilirubin: 1.1 mg/dL (ref 0.3–1.2)
Total Protein: 7.1 g/dL (ref 6.5–8.1)

## 2014-12-30 LAB — TYPE AND SCREEN
ABO/RH(D): O POS
Antibody Screen: NEGATIVE

## 2014-12-30 LAB — POC OCCULT BLOOD, ED: FECAL OCCULT BLD: POSITIVE — AB

## 2014-12-30 LAB — RETICULOCYTES
RBC.: 3.34 MIL/uL — ABNORMAL LOW (ref 3.87–5.11)
Retic Count, Absolute: 100.2 10*3/uL (ref 19.0–186.0)
Retic Ct Pct: 3 % (ref 0.4–3.1)

## 2014-12-30 LAB — CBC
HCT: 32.7 % — ABNORMAL LOW (ref 36.0–46.0)
Hemoglobin: 10.7 g/dL — ABNORMAL LOW (ref 12.0–15.0)
MCH: 31.2 pg (ref 26.0–34.0)
MCHC: 32.7 g/dL (ref 30.0–36.0)
MCV: 95.3 fL (ref 78.0–100.0)
PLATELETS: 433 10*3/uL — AB (ref 150–400)
RBC: 3.43 MIL/uL — AB (ref 3.87–5.11)
RDW: 13.2 % (ref 11.5–15.5)
WBC: 6.6 10*3/uL (ref 4.0–10.5)

## 2014-12-30 LAB — PROTIME-INR
INR: 1.19 (ref 0.00–1.49)
Prothrombin Time: 15.3 seconds — ABNORMAL HIGH (ref 11.6–15.2)

## 2014-12-30 MED ORDER — SODIUM CHLORIDE 0.9 % IJ SOLN
3.0000 mL | Freq: Two times a day (BID) | INTRAMUSCULAR | Status: DC
  Administered 2014-12-31 – 2015-01-01 (×3): 3 mL via INTRAVENOUS

## 2014-12-30 MED ORDER — TRAMADOL HCL 50 MG PO TABS: 50.0000 mg | ORAL_TABLET | Freq: Four times a day (QID) | ORAL | Status: DC | PRN

## 2014-12-30 MED ORDER — MORPHINE SULFATE (PF) 2 MG/ML IV SOLN
2.0000 mg | Freq: Once | INTRAVENOUS | Status: AC
  Administered 2014-12-30: 2 mg via INTRAVENOUS
  Filled 2014-12-30: qty 1

## 2014-12-30 MED ORDER — HYDROCODONE-ACETAMINOPHEN 5-325 MG PO TABS
1.0000 | ORAL_TABLET | ORAL | Status: DC | PRN
  Administered 2014-12-31: 2 via ORAL
  Filled 2014-12-30: qty 2

## 2014-12-30 MED ORDER — PRAVASTATIN SODIUM 20 MG PO TABS
10.0000 mg | ORAL_TABLET | Freq: Every day | ORAL | Status: DC
  Administered 2015-01-01: 10 mg via ORAL
  Filled 2014-12-30 (×3): qty 1

## 2014-12-30 MED ORDER — LEVOTHYROXINE SODIUM 112 MCG PO TABS
112.0000 ug | ORAL_TABLET | Freq: Every day | ORAL | Status: DC
  Administered 2014-12-31 – 2015-01-02 (×3): 112 ug via ORAL
  Filled 2014-12-30 (×3): qty 1

## 2014-12-30 MED ORDER — ONDANSETRON HCL 4 MG PO TABS: 4.0000 mg | ORAL_TABLET | Freq: Four times a day (QID) | ORAL | Status: DC | PRN

## 2014-12-30 MED ORDER — METHOCARBAMOL 500 MG PO TABS
500.0000 mg | ORAL_TABLET | Freq: Four times a day (QID) | ORAL | Status: DC | PRN
Start: 2014-12-30 — End: 2015-01-02

## 2014-12-30 MED ORDER — ONDANSETRON HCL 4 MG/2ML IJ SOLN: 4.0000 mg | Freq: Four times a day (QID) | INTRAMUSCULAR | Status: DC | PRN

## 2014-12-30 MED ORDER — ACETAMINOPHEN 650 MG RE SUPP: 650.0000 mg | Freq: Four times a day (QID) | RECTAL | Status: DC | PRN

## 2014-12-30 MED ORDER — SODIUM CHLORIDE 0.9 % IV SOLN
INTRAVENOUS | Status: AC
  Administered 2014-12-30: 22:00:00 via INTRAVENOUS

## 2014-12-30 MED ORDER — ACETAMINOPHEN 325 MG PO TABS
650.0000 mg | ORAL_TABLET | Freq: Four times a day (QID) | ORAL | Status: DC | PRN
  Administered 2015-01-01: 650 mg via ORAL
  Filled 2014-12-30: qty 2
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
## 2014-12-30 NOTE — ED Notes (Signed)
She states she had hip replacement surgery 9-27 of this year by Dr. Truddie Coco.  She states she has been on Xerelto ever since surgery.  She c/o mild constipation after surgery--here today with c/o passed a quantity of blood (no stool--just blood) today.  She is in no distress and denies any abd. Pain.

## 2014-12-30 NOTE — ED Notes (Signed)
Pt is status post left hip replacement 12/20/14 and had her first postop bowel movement today which was bright red and bloody.  Hx of hemorrhoids but no other remarkable GI conditions per pt.  Pain currently rated 4/10 across lower abdomen. Bowel sounds are present in all four quadrants on auscultation.

## 2014-12-30 NOTE — ED Notes (Addendum)
EKG given to Dr Belfi 
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
## 2014-12-31 DIAGNOSIS — D638 Anemia in other chronic diseases classified elsewhere: Secondary | ICD-10-CM | POA: Diagnosis not present

## 2014-12-31 DIAGNOSIS — I10 Essential (primary) hypertension: Secondary | ICD-10-CM | POA: Diagnosis not present

## 2014-12-31 DIAGNOSIS — K5730 Diverticulosis of large intestine without perforation or abscess without bleeding: Secondary | ICD-10-CM | POA: Diagnosis not present

## 2014-12-31 DIAGNOSIS — K625 Hemorrhage of anus and rectum: Secondary | ICD-10-CM | POA: Diagnosis not present

## 2014-12-31 LAB — COMPREHENSIVE METABOLIC PANEL
ALK PHOS: 50 U/L (ref 38–126)
ALT: 16 U/L (ref 14–54)
ANION GAP: 6 (ref 5–15)
AST: 18 U/L (ref 15–41)
Albumin: 3.4 g/dL — ABNORMAL LOW (ref 3.5–5.0)
BILIRUBIN TOTAL: 0.9 mg/dL (ref 0.3–1.2)
BUN: 9 mg/dL (ref 6–20)
CALCIUM: 9.2 mg/dL (ref 8.9–10.3)
CO2: 27 mmol/L (ref 22–32)
Chloride: 107 mmol/L (ref 101–111)
Creatinine, Ser: 0.52 mg/dL (ref 0.44–1.00)
GFR calc Af Amer: >60 mL/min (ref >60)
Glucose, Bld: 94 mg/dL (ref 65–99)
POTASSIUM: 4.1 mmol/L (ref 3.5–5.1)
Sodium: 140 mmol/L (ref 135–145)
TOTAL PROTEIN: 5.8 g/dL — AB (ref 6.5–8.1)

## 2014-12-31 LAB — CBC
HCT: 33.7 % — ABNORMAL LOW (ref 36.0–46.0)
HEMATOCRIT: 30.4 % — AB (ref 36.0–46.0)
HEMOGLOBIN: 10.1 g/dL — AB (ref 12.0–15.0)
HEMOGLOBIN: 11 g/dL — AB (ref 12.0–15.0)
MCH: 31.8 pg (ref 26.0–34.0)
MCH: 31 pg (ref 26.0–34.0)
MCHC: 33.2 g/dL (ref 30.0–36.0)
MCHC: 32.6 g/dL (ref 30.0–36.0)
MCV: 95.6 fL (ref 78.0–100.0)
MCV: 94.9 fL (ref 78.0–100.0)
PLATELETS: 353 10*3/uL (ref 150–400)
Platelets: 331 10*3/uL (ref 150–400)
RBC: 3.18 MIL/uL — ABNORMAL LOW (ref 3.87–5.11)
RBC: 3.55 MIL/uL — AB (ref 3.87–5.11)
RDW: 13.2 % (ref 11.5–15.5)
RDW: 13.1 % (ref 11.5–15.5)
WBC: 5.9 10*3/uL (ref 4.0–10.5)
WBC: 9 10*3/uL (ref 4.0–10.5)

## 2014-12-31 LAB — IRON AND TIBC
Iron: 29 ug/dL (ref 28–170)
Saturation Ratios: 12 % (ref 10.4–31.8)
TIBC: 251 ug/dL (ref 250–450)
UIBC: 222 ug/dL

## 2014-12-31 LAB — MAGNESIUM: MAGNESIUM: 2.1 mg/dL (ref 1.7–2.4)

## 2014-12-31 LAB — T4, FREE: FREE T4: 1.29 ng/dL — AB (ref 0.61–1.12)

## 2014-12-31 LAB — FERRITIN: FERRITIN: 575 ng/mL — AB (ref 11–307)

## 2014-12-31 LAB — TSH: TSH: 10.072 u[IU]/mL — ABNORMAL HIGH (ref 0.350–4.500)

## 2014-12-31 LAB — VITAMIN B12: Vitamin B-12: 642 pg/mL (ref 180–914)

## 2014-12-31 LAB — FOLATE: Folate: 39 ng/mL (ref >5.9)

## 2014-12-31 LAB — PHOSPHORUS: PHOSPHORUS: 3.1 mg/dL (ref 2.5–4.6)

## 2014-12-31 MED ORDER — PANTOPRAZOLE SODIUM 40 MG PO TBEC
40.0000 mg | DELAYED_RELEASE_TABLET | Freq: Every day | ORAL | Status: DC
  Administered 2014-12-31 – 2015-01-02 (×3): 40 mg via ORAL
  Filled 2014-12-31 (×3): qty 1

## 2014-12-31 MED ORDER — PEG-KCL-NACL-NASULF-NA ASC-C 100 G PO SOLR
0.5000 | Freq: Once | ORAL | Status: AC
  Administered 2014-12-31: 100 g via ORAL
  Filled 2014-12-31: qty 1

## 2014-12-31 MED ORDER — PEG-KCL-NACL-NASULF-NA ASC-C 100 G PO SOLR: 1.0000 | Freq: Once | ORAL | Status: DC

## 2014-12-31 MED ORDER — SODIUM CHLORIDE 0.9 % IV SOLN: INTRAVENOUS | Status: DC

## 2014-12-31 MED ORDER — POLYETHYLENE GLYCOL 3350 17 G PO PACK
34.0000 g | PACK | Freq: Once | ORAL | Status: AC
  Administered 2014-12-31: 34 g via ORAL
  Filled 2014-12-31: qty 2

## 2014-12-31 MED ORDER — MAGNESIUM HYDROXIDE 400 MG/5ML PO SUSP
960.0000 mL | Freq: Once | ORAL | Status: AC
  Administered 2014-12-31: 960 mL via RECTAL
  Filled 2014-12-31: qty 240

## 2014-12-31 MED ORDER — PEG-KCL-NACL-NASULF-NA ASC-C 100 G PO SOLR
0.5000 | Freq: Once | ORAL | Status: AC
  Administered 2015-01-01: 100 g via ORAL
  Filled 2014-12-31: qty 1

## 2014-12-31 NOTE — Progress Notes (Signed)
[facility] PROGRESS NOTE  ANGELENE ROME BDZ:[id number] DOB: 12-28-1945 DOA: 12/30/2014 PCP: Jonathon Bellows, MD  Assessment/Plan: 1. Hypertension: Slightly high, , monitor.   2. GIB source possibly lower GI bleed.  GI consulted and recommendations given.  Monitor H&H.  NPO after midnight.  Flex sigmoidoscopy in am.  On PPI.    3.  Mild Blood loss anemia: From GI bleed.  Monitor and transfuse to keep it greater than 7.   4. H/o hip replacement on xarelto, which has been held.    Hypothyroidism: resume synthroid.    H/o Breast cancer: Outpatient follo wup with Dr Curlene Labrum.     Code Status: fulll code.  Family Communication: none at bedside Disposition Plan: pending.    Consultants:  Gastroenterology.   Procedures:  none  Antibiotics:  none  HPI/Subjective: No pain, no BM today.   Objective: Filed Vitals:   12/31/14 1521  BP: 164/64  Pulse: 68  Temp: 98.7 F (37.1 C)  Resp: 18    Intake/Output Summary (Last 24 hours) at 12/31/14 1901 Last data filed at 12/31/14 1718  Gross per 24 hour  Intake 886.25 ml  Output      0 ml  Net 886.25 ml   Filed Weights   12/30/14 2200  Weight: 71.4 kg (157 lb 6.5 oz)    Exam:   General:  Alert afebrile comfortable  Cardiovascular: s1s2  Respiratory: ctab  Abdomen: soft mild gen tenderness, bowel sounds heard  Musculoskeletal: no pedal edema.   Data Reviewed: Basic Metabolic Panel:  Recent Labs Lab 12/30/14 1800 12/31/14 0527  NA 137 140  K 4.0 4.1  CL 103 107  CO2 29 27  GLUCOSE 101* 94  BUN 10 9  CREATININE 0.60 0.52  CALCIUM 9.7 9.2  MG  --  2.1  PHOS  --  3.1   Liver Function Tests:  Recent Labs Lab 12/30/14 1800 12/31/14 0527  AST 26 18  ALT 20 16  ALKPHOS 59 50  BILITOT 1.1 0.9  PROT 7.1 5.8*  ALBUMIN 4.1 3.4*   No results for input(s): LIPASE, AMYLASE in the last 168 hours. No results for input(s): AMMONIA in the last 168 hours. CBC:  Recent Labs Lab  12/30/14 1800 12/31/14 0527 12/31/14 1430  WBC 6.6 5.9 9.0  HGB 10.7* 10.1* 11.0*  HCT 32.7* 30.4* 33.7*  MCV 95.3 95.6 94.9  PLT 433* 331 353   Cardiac Enzymes: No results for input(s): CKTOTAL, CKMB, CKMBINDEX, TROPONINI in the last 168 hours. BNP (last 3 results) No results for input(s): BNP in the last 8760 hours.  ProBNP (last 3 results) No results for input(s): PROBNP in the last 8760 hours.  CBG: No results for input(s): GLUCAP in the last 168 hours.  No results found for this or any previous visit (from the past 240 hour(s)).   Studies: No results found.  Scheduled Meds: . levothyroxine  112 mcg Oral QAC breakfast  . pantoprazole  40 mg Oral Q0600  . [START ON 01/01/2015] peg 3350 powder  0.5 kit Oral Once  . pravastatin  10 mg Oral q1800  . sodium chloride  3 mL Intravenous Q12H   Continuous Infusions: . sodium chloride      Active Problems:   Breast cancer of upper-outer quadrant of right female breast (HCC)   Essential hypertension   BRBPR (bright red blood per rectum)   Anemia    Time spent: 28 minutes.     [facility] Pager [phone]  If 7PM-7AM, please contact night-coverage at [email or web address], password [facility] 12/31/2014, 7:01 PM
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
## 2014-12-31 NOTE — Progress Notes (Signed)
SMOG enema administered. Minimal stool. Patient did not tolerate enema well.

## 2015-01-01 ENCOUNTER — Observation Stay (HOSPITAL_COMMUNITY): Payer: Medicare Other | Admitting: Anesthesiology

## 2015-01-01 ENCOUNTER — Encounter (HOSPITAL_COMMUNITY)
Admission: EM | Disposition: A | Discharge status: Home / Self Care | Payer: Self-pay | Source: Home / Self Care | Attending: Emergency Medicine

## 2015-01-01 ENCOUNTER — Encounter (HOSPITAL_COMMUNITY): Payer: Self-pay | Admitting: Anesthesiology

## 2015-01-01 DIAGNOSIS — K5790 Diverticulosis of intestine, part unspecified, without perforation or abscess without bleeding: Secondary | ICD-10-CM | POA: Diagnosis not present

## 2015-01-01 DIAGNOSIS — K640 First degree hemorrhoids: Secondary | ICD-10-CM | POA: Diagnosis not present

## 2015-01-01 DIAGNOSIS — K5730 Diverticulosis of large intestine without perforation or abscess without bleeding: Secondary | ICD-10-CM | POA: Diagnosis not present

## 2015-01-01 DIAGNOSIS — K635 Polyp of colon: Secondary | ICD-10-CM | POA: Diagnosis not present

## 2015-01-01 DIAGNOSIS — K625 Hemorrhage of anus and rectum: Secondary | ICD-10-CM | POA: Diagnosis not present

## 2015-01-01 DIAGNOSIS — D122 Benign neoplasm of ascending colon: Secondary | ICD-10-CM

## 2015-01-01 DIAGNOSIS — K921 Melena: Secondary | ICD-10-CM | POA: Diagnosis not present

## 2015-01-01 DIAGNOSIS — K649 Unspecified hemorrhoids: Secondary | ICD-10-CM | POA: Diagnosis not present

## 2015-01-01 DIAGNOSIS — I10 Essential (primary) hypertension: Secondary | ICD-10-CM | POA: Diagnosis not present

## 2015-01-01 HISTORY — PX: COLONOSCOPY WITH PROPOFOL: SHX5780

## 2015-01-01 LAB — CBC
HCT: 33.1 % — ABNORMAL LOW (ref 36.0–46.0)
Hemoglobin: 10.6 g/dL — ABNORMAL LOW (ref 12.0–15.0)
MCH: 30.5 pg (ref 26.0–34.0)
MCHC: 32 g/dL (ref 30.0–36.0)
MCV: 95.1 fL (ref 78.0–100.0)
PLATELETS: 366 10*3/uL (ref 150–400)
RBC: 3.48 MIL/uL — AB (ref 3.87–5.11)
RDW: 13.3 % (ref 11.5–15.5)
WBC: 6.4 10*3/uL (ref 4.0–10.5)

## 2015-01-01 LAB — T3, FREE: T3 FREE: 1.8 pg/mL — AB (ref 2.0–4.4)

## 2015-01-01 SURGERY — COLONOSCOPY WITH PROPOFOL
Anesthesia: Monitor Anesthesia Care

## 2015-01-01 SURGERY — SIGMOIDOSCOPY, FLEXIBLE
Anesthesia: Moderate Sedation

## 2015-01-01 MED ORDER — PROPOFOL 500 MG/50ML IV EMUL
INTRAVENOUS | Status: DC | PRN
  Administered 2015-01-01: 75 ug/kg/min via INTRAVENOUS

## 2015-01-01 MED ORDER — LISINOPRIL 10 MG PO TABS
10.0000 mg | ORAL_TABLET | Freq: Every day | ORAL | Status: DC
  Administered 2015-01-01: 10 mg via ORAL
  Filled 2015-01-01 (×2): qty 1

## 2015-01-01 MED ORDER — HYDRALAZINE HCL 20 MG/ML IJ SOLN: 5.0000 mg | Freq: Four times a day (QID) | INTRAMUSCULAR | Status: DC | PRN

## 2015-01-01 MED ORDER — PROPOFOL 10 MG/ML IV BOLUS
INTRAVENOUS | Status: AC
Start: 2015-01-01 — End: 2015-01-01
  Filled 2015-01-01: qty 20

## 2015-01-01 MED ORDER — PROPOFOL 10 MG/ML IV BOLUS
INTRAVENOUS | Status: AC
  Filled 2015-01-01: qty 20

## 2015-01-01 MED ORDER — PROPOFOL 10 MG/ML IV BOLUS
INTRAVENOUS | Status: DC | PRN
  Administered 2015-01-01 (×2): 20 mg via INTRAVENOUS
  Administered 2015-01-01: 40 mg via INTRAVENOUS
  Administered 2015-01-01 (×3): 20 mg via INTRAVENOUS

## 2015-01-01 MED ORDER — LACTATED RINGERS IV SOLN
INTRAVENOUS | Status: DC
  Administered 2015-01-01 (×2): via INTRAVENOUS

## 2015-01-01 MED ORDER — SODIUM CHLORIDE 0.9 % IV SOLN: INTRAVENOUS | Status: DC

## 2015-01-01 SURGICAL SUPPLY — 22 items

## 2015-01-01 NOTE — Interval H&P Note (Signed)
History and Physical Interval Note:  01/01/2015 11:25 AM  Shannon Grant  has presented today for surgery, with the diagnosis of hematochezia  The various methods of treatment have been discussed with the patient and family. After consideration of risks, benefits and other options for treatment, the patient has consented to  Procedure(s): COLONOSCOPY WITH PROPOFOL (N/A) as a surgical intervention .  The patient's history has been reviewed, patient examined, no change in status, stable for surgery.  I have reviewed the patient's chart and labs.  Questions were answered to the patient's satisfaction.     Pricilla Riffle. Fuller Plan

## 2015-01-01 NOTE — Anesthesia Preprocedure Evaluation (Signed)
Anesthesia Evaluation  Patient identified by MRN, date of birth, ID band Patient awake    Reviewed: Allergy & Precautions, NPO status , Patient's Chart, lab work & pertinent test results  Airway Mallampati: II  TM Distance: >3 FB Neck ROM: Full    Dental no notable dental hx.    Pulmonary neg pulmonary ROS, former smoker,    Pulmonary exam normal breath sounds clear to auscultation       Cardiovascular hypertension, Pt. on medications Normal cardiovascular exam Rhythm:Regular Rate:Normal     Neuro/Psych negative neurological ROS  negative psych ROS   GI/Hepatic Neg liver ROS, hiatal hernia,   Endo/Other  Hypothyroidism   Renal/GU negative Renal ROS  negative genitourinary   Musculoskeletal negative musculoskeletal ROS (+)   Abdominal   Peds negative pediatric ROS (+)  Hematology negative hematology ROS (+)   Anesthesia Other Findings   Reproductive/Obstetrics negative OB ROS                             Anesthesia Physical  Anesthesia Plan  ASA: II  Anesthesia Plan: MAC   Post-op Pain Management:    Induction:   Airway Management Planned: Simple Face Mask  Additional Equipment:   Intra-op Plan:   Post-operative Plan:   Informed Consent: I have reviewed the patients History and Physical, chart, labs and discussed the procedure including the risks, benefits and alternatives for the proposed anesthesia with the patient or authorized representative who has indicated his/her understanding and acceptance.   Dental advisory given  Plan Discussed with: CRNA  Anesthesia Plan Comments:         Anesthesia Quick Evaluation

## 2015-01-01 NOTE — Transfer of Care (Signed)
Immediate Anesthesia Transfer of Care Note  Patient: Shannon Grant  Procedure(s) Performed: Procedure(s): COLONOSCOPY WITH PROPOFOL (N/A)  Patient Location: PACU  Anesthesia Type:MAC  Level of Consciousness:  sedated, patient cooperative and responds to stimulation  Airway & Oxygen Therapy:Patient Spontanous Breathing and Patient connected to face mask oxgen  Post-op Assessment:  Report given to PACU RN and Post -op Vital signs reviewed and stable  Post vital signs:  Reviewed and stable  Last Vitals:  Filed Vitals:   01/01/15 1042  BP: 180/81  Pulse: 72  Temp: 36.8 C  Resp: 20    Complications: No apparent anesthesia complications

## 2015-01-01 NOTE — Progress Notes (Signed)
Patient for colonoscopy later today for evaluation of painless rectal bleeding.  Rule out diverticular vs hemorrhoidal vs neoplasm, etc.  Patient seems to be prepped well from what her and her daughter are telling me.  No labs including Hgb ordered for this AM.

## 2015-01-01 NOTE — Anesthesia Postprocedure Evaluation (Signed)
  Anesthesia Post-op Note  Patient: Shannon Grant  Procedure(s) Performed: Procedure(s) (LRB): COLONOSCOPY WITH PROPOFOL (N/A)  Patient Location: PACU  Anesthesia Type: MAC  Level of Consciousness: awake and alert   Airway and Oxygen Therapy: Patient Spontanous Breathing  Post-op Pain: mild  Post-op Assessment: Post-op Vital signs reviewed, Patient's Cardiovascular Status Stable, Respiratory Function Stable, Patent Airway and No signs of Nausea or vomiting  Last Vitals:  Filed Vitals:   01/01/15 1308  BP: 187/81  Pulse: 64  Temp: 36.5 C  Resp: 17    Post-op Vital Signs: stable   Complications: No apparent anesthesia complications
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
## 2015-01-01 NOTE — Progress Notes (Signed)
[facility] PROGRESS NOTE  Shannon Grant LDJ:[id number] DOB: [birth year] DOA: 12/30/2014 PCP: Jonathon Bellows, MD  Assessment/Plan: 1. Hypertension: Restart home meds and IV hydralazine.   2. GIB source possibly lower GI bleed.  GI consulted and recommendations given.  Monitor H&H.  Flex sigmoidoscopy shows   ENDOSCOPIC IMPRESSION: 1. Moderate diverticulosis in the sigmoid colon and descending colon 2. Sessile polyp in the ascending colon; polypectomy performed using snare cautery 3. Moderate sized grade l internal hemorrhoids  On PPI.    3.  Mild Blood loss anemia: From GI bleed.  Monitor and transfuse to keep it greater than 7.   4. H/o hip replacement on xarelto, which has been held.    Hypothyroidism: resume synthroid.    H/o Breast cancer: Outpatient follo wup with Dr Curlene Labrum.     Code Status: fulll code.  Family Communication: none at bedside Disposition Plan: pending.    Consultants:  Gastroenterology.   Procedures:  none  Antibiotics:  none  HPI/Subjective: No pain, no BM today.   Objective: Filed Vitals:   01/01/15 1725  BP: 158/74  Pulse:   Temp:   Resp:     Intake/Output Summary (Last 24 hours) at 01/01/15 1850 Last data filed at 01/01/15 1233  Gross per 24 hour  Intake    500 ml  Output      0 ml  Net    500 ml   Filed Weights   12/30/14 2200 01/01/15 1042  Weight: 71.4 kg (157 lb 6.5 oz) 68.04 kg (150 lb)    Exam:   General:  Alert afebrile comfortable  Cardiovascular: s1s2  Respiratory: ctab  Abdomen: soft no tenderness, bowel sounds heard  Musculoskeletal: no pedal edema.   Data Reviewed: Basic Metabolic Panel:  Recent Labs Lab 12/30/14 1800 12/31/14 0527  NA 137 140  K 4.0 4.1  CL 103 107  CO2 29 27  GLUCOSE 101* 94  BUN 10 9  CREATININE 0.60 0.52  CALCIUM 9.7 9.2  MG  --  2.1  PHOS  --  3.1   Liver Function Tests:  Recent Labs Lab 12/30/14 1800 12/31/14 0527  AST 26 18   ALT 20 16  ALKPHOS 59 50  BILITOT 1.1 0.9  PROT 7.1 5.8*  ALBUMIN 4.1 3.4*   No results for input(s): LIPASE, AMYLASE in the last 168 hours. No results for input(s): AMMONIA in the last 168 hours. CBC:  Recent Labs Lab 12/30/14 1800 12/31/14 0527 12/31/14 1430 01/01/15 1355  WBC 6.6 5.9 9.0 6.4  HGB 10.7* 10.1* 11.0* 10.6*  HCT 32.7* 30.4* 33.7* 33.1*  MCV 95.3 95.6 94.9 95.1  PLT 433* 331 353 366   Cardiac Enzymes: No results for input(s): CKTOTAL, CKMB, CKMBINDEX, TROPONINI in the last 168 hours. BNP (last 3 results) No results for input(s): BNP in the last 8760 hours.  ProBNP (last 3 results) No results for input(s): PROBNP in the last 8760 hours.  CBG: No results for input(s): GLUCAP in the last 168 hours.  No results found for this or any previous visit (from the past 240 hour(s)).   Studies: No results found.  Scheduled Meds: . levothyroxine  112 mcg Oral QAC breakfast  . lisinopril  10 mg Oral Daily  . pantoprazole  40 mg Oral Q0600  . pravastatin  10 mg Oral q1800  . sodium chloride  3 mL Intravenous Q12H   Continuous Infusions: . lactated ringers 10 mL/hr at 01/01/15 1352    Active Problems:  Breast cancer of upper-outer quadrant of right female breast ([place])   Essential hypertension   BRBPR (bright red blood per rectum)   Anemia    Time spent: 25 minutes.     [facility]  [facility] Pager [phone]  If 7PM-7AM, please contact night-coverage at [email or web address], password [email or web address] 01/01/2015, 6:50 PM

## 2015-01-01 NOTE — Op Note (Signed)
[facility] [place], [id number]   COLONOSCOPY PROCEDURE REPORT  PATIENT: Shannon, Grant  MR#: [id number] BIRTHDATE: 1946-02-17 , 69  yrs. old GENDER: female ENDOSCOPIST: Ladene Artist, MD, [facility] REFERRED HL:[id number] PROCEDURE DATE:  01/01/2015 PROCEDURE:   Colonoscopy, diagnostic and Colonoscopy with snare polypectomy First Screening Colonoscopy - Avg.  risk and is 50 yrs.  old or older - No.  Prior Negative Screening - Now for repeat screening. N/A  History of Adenoma - Now for follow-up colonoscopy & has been > or = to 3 yrs.  N/A  Polyps removed today? Yes ASA CLASS:   Class III INDICATIONS:Evaluation of unexplained GI bleeding and Colorectal Neoplasm Risk Assessment for this procedure is average risk. MEDICATIONS: Monitored anesthesia care and Per Anesthesia DESCRIPTION OF PROCEDURE:   After the risks benefits and alternatives of the procedure were thoroughly explained, informed consent was obtained.  The digital rectal exam revealed no abnormalities of the rectum.   The Pentax Ped Colon [id number] endoscope was introduced through the anus and advanced to the cecum, which was identified by both the appendix and ileocecal valve. No adverse events experienced.   The quality of the prep was good.  (MoviPrep was used)  The instrument was then slowly withdrawn as the colon was fully examined. Estimated blood loss is zero unless otherwise noted in this procedure report.  COLON FINDINGS: There was moderate diverticulosis noted in the sigmoid colon and descending colon with associated muscular hypertrophy.   A sessile polyp measuring 8 mm in size was found in the ascending colon.  A polypectomy was performed using snare cautery.  The resection was complete, the polyp tissue was completely retrieved and sent to histology.   The examination was otherwise normal.  Retroflexed views revealed internal Grade I hemorrhoids. The time to cecum = 5.5  Withdrawal time = 15.0   The scope was withdrawn and the procedure completed. COMPLICATIONS: There were no immediate complications.  ENDOSCOPIC IMPRESSION: 1.   Moderate diverticulosis in the sigmoid colon and descending colon 2.   Sessile polyp in the ascending colon; polypectomy performed using snare cautery 3.   Moderate sized grade l internal hemorrhoids  RECOMMENDATIONS: 1.  Hold Aspirin and all other NSAIDS for at least 2 weeks. 2.  Await pathology results 3.  Repeat colonoscopy in 5 years if polyp adenomatous; otherwise consider colonoscopy in 10 years 4.  High fiber diet with liberal fluid intake and Prep H supp pr daily as needed 5.  Hematochezia secondary to internal hemorrhoids or diverticulosis  6.  If anticoagulation is indicated would wait at least 2 days before restarting  eSigned:  Ladene Artist, MD, [facility] 01/01/2015 12:27 PM

## 2015-01-02 ENCOUNTER — Encounter: Payer: Self-pay | Admitting: Gastroenterology

## 2015-01-02 ENCOUNTER — Encounter (HOSPITAL_COMMUNITY): Payer: Self-pay | Admitting: Gastroenterology

## 2015-01-02 DIAGNOSIS — I10 Essential (primary) hypertension: Secondary | ICD-10-CM | POA: Diagnosis not present

## 2015-01-02 DIAGNOSIS — K5730 Diverticulosis of large intestine without perforation or abscess without bleeding: Secondary | ICD-10-CM | POA: Diagnosis not present

## 2015-01-02 DIAGNOSIS — C50411 Malignant neoplasm of upper-outer quadrant of right female breast: Secondary | ICD-10-CM

## 2015-01-02 DIAGNOSIS — K625 Hemorrhage of anus and rectum: Secondary | ICD-10-CM | POA: Diagnosis not present

## 2015-01-02 MED ORDER — RIVAROXABAN 10 MG PO TABS: 10.0000 mg | ORAL_TABLET | Freq: Every day | ORAL | Status: DC

## 2015-01-02 NOTE — Discharge Summary (Signed)
Physician Discharge Summary  Shannon Grant OTL:[id number] DOB: 1945/08/27 DOA: 12/30/2014  PCP: Jonathon Bellows, MD  Admit date: 12/30/2014 Discharge date: 01/02/2015  Time spent: 30 minutes  Recommendations for Outpatient Follow-up:  1. Follow up with Dr Maureen Ralphs later today.  2. Follow up with gastroenterology for the biopsy results. 3. Follow up with CBC in one week.   Discharge Diagnoses:  Active Problems:   Breast cancer of upper-outer quadrant of right female breast ([place])   Essential hypertension   BRBPR (bright red blood per rectum)   Anemia   Discharge Condition: improved.   Diet recommendation: low sodium diet  Filed Weights   12/30/14 2200 01/01/15 1042  Weight: 71.4 kg (157 lb 6.5 oz) 68.04 kg (150 lb)    History of present illness:  Shannon Grant is a 69 y.o. female has a past medical history of Hypertension; Thyroid disease; High cholesterol, UTI, presented with painless bloody bowel movements.   Hospital Course:  1. Hypertension: Restart home meds on discharge.   2. GIB source possibly lower GI bleed.  GI consulted and recommendations given.  Monitor H&H in one discharge.  Flex sigmoidoscopy shows   ENDOSCOPIC IMPRESSION: 1. Moderate diverticulosis in the sigmoid colon and descending colon 2. Sessile polyp in the ascending colon; polypectomy performed using snare cautery 3. Moderate sized grade l internal hemorrhoids  On PPI.    Mild Blood loss anemia: From GI bleed. hemoglobin stable.    H/o hip replacement on xarelto, which has been held. Outpatient follow up with Dr Maureen Ralphs and plan to hold the xarelto for atleast 2 to 3 days before restarting it after discussing it with Dr Raphael Gibney.    Hypothyroidism: resume synthroid.    H/o Breast cancer: Outpatient follow up with Dr Curlene Labrum.   Procedures:  Colonoscopy.   Consultations:  Gastroenterology.   Discharge Exam: Filed Vitals:   01/02/15 0540  BP: 155/73  Pulse: 75   Temp: 98 F (36.7 C)  Resp: 19    General: alert afebrile comfortable.  Cardiovascular: s1s2 Respiratory: ctab.   Discharge Instructions   Discharge Instructions    Diet - low sodium heart healthy    Complete by:  As directed      Discharge instructions    Complete by:  As directed   Follow up the results of the biopsy with gastroenterology .  Please check hemoglobin in one week at PCP office.  Please follow up with Dr Maureen Ralphs today and discuss with the need of anti coagulation/ xarelto. If you need to be started on xarelto, please hold it for a 2 to 3 days before restarting it.          Discharge Medication List as of 01/02/2015 10:12 AM    CONTINUE these medications which have CHANGED   Details  rivaroxaban (XARELTO) 10 MG TABS tablet Take 1 tablet (10 mg total) by mouth daily with breakfast., Starting 01/02/2015, Until Discontinued, No Print      CONTINUE these medications which have NOT CHANGED   Details  co-enzyme Q-10 30 MG capsule Take 30 mg by mouth daily., Until Discontinued, Historical Med    levothyroxine (SYNTHROID, LEVOTHROID) 112 MCG tablet Take 112 mcg by mouth daily before breakfast. , Starting 06/03/2014, Until Discontinued, Historical Med    lisinopril (PRINIVIL,ZESTRIL) 10 MG tablet Take 1 tablet (10 mg total) by mouth daily., Starting 09/19/2014, Until Discontinued, Normal    methocarbamol (ROBAXIN) 500 MG tablet Take 1 tablet (500 mg total) by mouth every 6 (  six) hours as needed for muscle spasms., Starting 12/21/2014, Until Discontinued, Print    Multiple Vitamins-Minerals (MULTIVITAMIN ADULT PO) Take 1 tablet by mouth daily., Until Discontinued, Historical Med    pravastatin (PRAVACHOL) 10 MG tablet Take 10 mg by mouth every morning. , Starting 06/21/2014, Until Discontinued, Historical Med    traMADol (ULTRAM) 50 MG tablet Take 1-2 tablets (50-100 mg total) by mouth every 6 (six) hours as needed for moderate pain., Starting 12/21/2014, Until Discontinued,  Print      STOP taking these medications     HYDROmorphone (DILAUDID) 2 MG tablet      Ibuprofen (ADVIL) 200 MG CAPS        Allergies  Allergen Reactions  . Codeine Nausea And Vomiting  . Statins Other (See Comments)    (Including Red Yeast Rice) Causes muscle cramps  . Ciprofloxacin Rash   Follow-up Information    Follow up with WEBB, CAROL D, MD. Schedule an appointment as soon as possible for a visit in 1 week.   Specialty:  Family Medicine   Contact information:   [address] [place] [address] [phone]        The results of significant diagnostics from this hospitalization (including imaging, microbiology, ancillary and laboratory) are listed below for reference.    Significant Diagnostic Studies: Dg Pelvis Portable  12/20/2014   CLINICAL DATA:  Osteoarthritis of the left hip. Status post left total hip replacement.  EXAM: PORTABLE PELVIS 1-2 VIEWS  COMPARISON:  None.  FINDINGS: Left hip prosthesis appears in good position. Soft tissue drain in place. No fractures. Right hip prosthesis also in place.  Calcified fibroid in the pelvis.  Bowel gas pattern in the pelvis is normal.  IMPRESSION: Satisfactory appearance of the left hip and pelvis in the AP projection after left total hip prosthesis insertion.   Electronically Signed   By: Lorriane Shire M.D.   On: 12/20/2014 18:09   Dg C-arm 1-60 Min-no Report  12/20/2014   CLINICAL DATA: left anterior hip   C-ARM 1-60 MINUTES  Fluoroscopy was utilized by the requesting physician.  No radiographic  interpretation.     Microbiology: No results found for this or any previous visit (from the past 240 hour(s)).   Labs: Basic Metabolic Panel:  Recent Labs Lab 12/30/14 1800 12/31/14 0527  NA 137 140  K 4.0 4.1  CL 103 107  CO2 29 27  GLUCOSE 101* 94  BUN 10 9  CREATININE 0.60 0.52  CALCIUM 9.7 9.2  MG  --  2.1  PHOS  --  3.1   Liver Function Tests:  Recent Labs Lab 12/30/14 1800  12/31/14 0527  AST 26 18  ALT 20 16  ALKPHOS 59 50  BILITOT 1.1 0.9  PROT 7.1 5.8*  ALBUMIN 4.1 3.4*   No results for input(s): LIPASE, AMYLASE in the last 168 hours. No results for input(s): AMMONIA in the last 168 hours. CBC:  Recent Labs Lab 12/30/14 1800 12/31/14 0527 12/31/14 1430 01/01/15 1355  WBC 6.6 5.9 9.0 6.4  HGB 10.7* 10.1* 11.0* 10.6*  HCT 32.7* 30.4* 33.7* 33.1*  MCV 95.3 95.6 94.9 95.1  PLT 433* 331 353 366   Cardiac Enzymes: No results for input(s): CKTOTAL, CKMB, CKMBINDEX, TROPONINI in the last 168 hours. BNP: BNP (last 3 results) No results for input(s): BNP in the last 8760 hours.  ProBNP (last 3 results) No results for input(s): PROBNP in the last 8760 hours.  CBG: No results for  input(s): GLUCAP in the last 168 hours.     SignedHosie Poisson  [facility] 01/02/2015, 9:45 PM

## 2015-01-04 DIAGNOSIS — Z96642 Presence of left artificial hip joint: Secondary | ICD-10-CM | POA: Diagnosis not present

## 2015-01-04 DIAGNOSIS — Z471 Aftercare following joint replacement surgery: Secondary | ICD-10-CM | POA: Diagnosis not present

## 2015-01-08 DIAGNOSIS — E039 Hypothyroidism, unspecified: Secondary | ICD-10-CM | POA: Diagnosis not present

## 2015-01-08 DIAGNOSIS — Z09 Encounter for follow-up examination after completed treatment for conditions other than malignant neoplasm: Secondary | ICD-10-CM | POA: Diagnosis not present

## 2015-01-08 DIAGNOSIS — D649 Anemia, unspecified: Secondary | ICD-10-CM | POA: Diagnosis not present

## 2015-01-08 DIAGNOSIS — R413 Other amnesia: Secondary | ICD-10-CM | POA: Diagnosis not present

## 2015-01-08 DIAGNOSIS — N390 Urinary tract infection, site not specified: Secondary | ICD-10-CM | POA: Diagnosis not present

## 2015-01-08 DIAGNOSIS — R399 Unspecified symptoms and signs involving the genitourinary system: Secondary | ICD-10-CM | POA: Diagnosis not present

## 2015-01-29 DIAGNOSIS — Z23 Encounter for immunization: Secondary | ICD-10-CM | POA: Diagnosis not present

## 2015-01-29 DIAGNOSIS — E039 Hypothyroidism, unspecified: Secondary | ICD-10-CM | POA: Diagnosis not present

## 2015-01-29 DIAGNOSIS — Z0000 Encounter for general adult medical examination without abnormal findings: Secondary | ICD-10-CM | POA: Diagnosis not present

## 2015-01-29 DIAGNOSIS — R399 Unspecified symptoms and signs involving the genitourinary system: Secondary | ICD-10-CM | POA: Diagnosis not present

## 2015-01-29 DIAGNOSIS — E7800 Pure hypercholesterolemia, unspecified: Secondary | ICD-10-CM | POA: Diagnosis not present

## 2015-01-29 DIAGNOSIS — R413 Other amnesia: Secondary | ICD-10-CM | POA: Diagnosis not present

## 2015-01-30 ENCOUNTER — Other Ambulatory Visit: Payer: Medicare Other

## 2015-01-30 ENCOUNTER — Ambulatory Visit: Payer: Medicare Other | Admitting: Oncology

## 2015-01-30 DIAGNOSIS — Z471 Aftercare following joint replacement surgery: Secondary | ICD-10-CM | POA: Diagnosis not present

## 2015-01-30 DIAGNOSIS — Z96642 Presence of left artificial hip joint: Secondary | ICD-10-CM | POA: Diagnosis not present

## 2015-01-30 NOTE — Progress Notes (Signed)
No show

## 2015-01-31 ENCOUNTER — Telehealth: Payer: Self-pay | Admitting: Oncology

## 2015-01-31 NOTE — Telephone Encounter (Signed)
Called patient with new appointment as she didn't realize she missed her follow up with dr Jana Hakim

## 2015-02-01 ENCOUNTER — Telehealth: Payer: Self-pay

## 2015-02-01 NOTE — Telephone Encounter (Signed)
Returning pt call about appts. She missed Tuesday so she is rescheduled to January. Pt sees these appts in her i-pad.

## 2015-02-02 ENCOUNTER — Other Ambulatory Visit: Payer: Self-pay | Admitting: Family Medicine

## 2015-02-02 DIAGNOSIS — R413 Other amnesia: Secondary | ICD-10-CM

## 2015-02-08 ENCOUNTER — Telehealth: Payer: Self-pay

## 2015-02-08 ENCOUNTER — Telehealth: Payer: Self-pay | Admitting: Oncology

## 2015-02-08 ENCOUNTER — Other Ambulatory Visit: Payer: Medicare Other

## 2015-02-08 NOTE — Telephone Encounter (Signed)
Per Juliann Pulse in triage no need for [facility] appointment today.

## 2015-02-08 NOTE — Telephone Encounter (Signed)
Pt is having discomfort in R breast - hx CA in that breast. Started about a month ago, gradually worsening, now constant. Next appt 04/11/15. She was unsure about wether she needed to be seen or not. Dr Jana Hakim is booked and so is Water quality scientist. I arranged [facility] appt today. No labs ordered.

## 2015-02-08 NOTE — Telephone Encounter (Signed)
Per Shannon Grant pt does not need to be seen until January appt with heather. If pt so wishes she can see her surgeon about the breast discomfort. Pt was accepting of this information.

## 2015-02-21 ENCOUNTER — Other Ambulatory Visit: Payer: Medicare Other

## 2015-02-21 DIAGNOSIS — I10 Essential (primary) hypertension: Secondary | ICD-10-CM | POA: Diagnosis not present

## 2015-02-21 DIAGNOSIS — N390 Urinary tract infection, site not specified: Secondary | ICD-10-CM | POA: Diagnosis not present

## 2015-03-05 ENCOUNTER — Ambulatory Visit
Admission: RE | Admit: 2015-03-05 | Discharge: 2015-03-05 | Disposition: A | Discharge status: Home / Self Care | Payer: Medicare Other | Source: Ambulatory Visit | Attending: Family Medicine | Admitting: Family Medicine

## 2015-03-05 ENCOUNTER — Encounter: Payer: BC Managed Care – PPO | Admitting: Gynecology

## 2015-03-05 DIAGNOSIS — R413 Other amnesia: Secondary | ICD-10-CM

## 2015-03-21 ENCOUNTER — Encounter: Payer: Self-pay | Admitting: Neurology

## 2015-03-21 ENCOUNTER — Ambulatory Visit (INDEPENDENT_AMBULATORY_CARE_PROVIDER_SITE_OTHER): Payer: Medicare Other | Admitting: Neurology

## 2015-03-21 VITALS — BP 128/84 | HR 81 | Resp 16 | Wt 154.0 lb

## 2015-03-21 DIAGNOSIS — Z859 Personal history of malignant neoplasm, unspecified: Secondary | ICD-10-CM

## 2015-03-21 DIAGNOSIS — R413 Other amnesia: Secondary | ICD-10-CM

## 2015-03-21 DIAGNOSIS — I10 Essential (primary) hypertension: Secondary | ICD-10-CM

## 2015-03-21 DIAGNOSIS — E785 Hyperlipidemia, unspecified: Secondary | ICD-10-CM

## 2015-03-21 DIAGNOSIS — G3184 Mild cognitive impairment, so stated: Secondary | ICD-10-CM

## 2015-03-21 MED ORDER — DONEPEZIL HCL 10 MG PO TABS: ORAL_TABLET | ORAL | Status: DC

## 2015-03-21 NOTE — Patient Instructions (Signed)
1. Schedule MRI brain with and without contrast 2. Start Aricept 10mg : Take 1/2 tablet daily for 2 weeks, then increase to 1 tablet daily 3. Continue control of BP, cholesterol, as well as physical exercise and brain stimulation exercises for brain health 4. Continue to monitor driving and home safety 5. Follow-up in 6 months, call for any problems
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
## 2015-03-26 ENCOUNTER — Encounter: Payer: Self-pay | Admitting: Neurology

## 2015-03-26 DIAGNOSIS — R413 Other amnesia: Secondary | ICD-10-CM | POA: Insufficient documentation

## 2015-03-26 DIAGNOSIS — G3184 Mild cognitive impairment, so stated: Secondary | ICD-10-CM | POA: Insufficient documentation

## 2015-03-26 DIAGNOSIS — Z859 Personal history of malignant neoplasm, unspecified: Secondary | ICD-10-CM | POA: Insufficient documentation

## 2015-03-27 DIAGNOSIS — H2513 Age-related nuclear cataract, bilateral: Secondary | ICD-10-CM | POA: Diagnosis not present

## 2015-03-28 ENCOUNTER — Telehealth: Payer: Self-pay | Admitting: Neurology

## 2015-03-28 NOTE — Telephone Encounter (Signed)
She can either try taking 1/2 tablet at night so when side effects peak she is asleep, or if not willing, hold off on medication. Thanks

## 2015-03-28 NOTE — Telephone Encounter (Signed)
PT called and said she did not like the way the Donepezil made her feel/Dawn CB# [phone] OK to leave message if no answer

## 2015-03-28 NOTE — Telephone Encounter (Signed)
Called patient and left msg on vm with advisement. Asked her to call back to let us know what she decides to do.

## 2015-03-28 NOTE — Telephone Encounter (Signed)
I spoke with patient she states that she took the Aricept 1/2 pill once and she got dizzy. She took the pill in the morning, she states she noticed the feeling when she was driving and turned around and went back home. She states she hasn't taken it since.

## 2015-03-30 ENCOUNTER — Telehealth: Payer: Self-pay | Admitting: *Deleted

## 2015-03-30 NOTE — Telephone Encounter (Signed)
This RN returned call to pt per her VM stating concern with ongoing Right breast discomfort and lump.  Per conversation Latrice states " it is kinda hard to explain- I am not in excruitating pain but I am aware I have a R breast vs the left "presently the lump seems to be gone but then I am not sure "  Escarleth states " unsure if this is normal and maybe it is - but I was hoping to be seen if anything for my sanity "  This RN discussed with Harley above may be normal post surgical symptoms- as well as her concerns are not uncommon and often patients like her have to adjust to a " new normal ".  Plan is to reschedule current appointment to next week post schedululed lab.  Pt verbalized appreciation and understanding of appt.  Pt was able to state she has written appointment down due to previously missed appointment.

## 2015-04-04 ENCOUNTER — Ambulatory Visit (HOSPITAL_BASED_OUTPATIENT_CLINIC_OR_DEPARTMENT_OTHER): Payer: Medicare Other | Admitting: Nurse Practitioner

## 2015-04-04 ENCOUNTER — Telehealth: Payer: Self-pay | Admitting: Nurse Practitioner

## 2015-04-04 ENCOUNTER — Other Ambulatory Visit (HOSPITAL_BASED_OUTPATIENT_CLINIC_OR_DEPARTMENT_OTHER): Payer: Medicare Other

## 2015-04-04 ENCOUNTER — Encounter: Payer: Self-pay | Admitting: Nurse Practitioner

## 2015-04-04 VITALS — BP 145/60 | HR 78 | Temp 98.1°F | Resp 18 | Ht 64.0 in | Wt 157.4 lb

## 2015-04-04 DIAGNOSIS — C50411 Malignant neoplasm of upper-outer quadrant of right female breast: Secondary | ICD-10-CM

## 2015-04-04 DIAGNOSIS — N6310 Unspecified lump in the right breast, unspecified quadrant: Secondary | ICD-10-CM

## 2015-04-04 DIAGNOSIS — N63 Unspecified lump in breast: Secondary | ICD-10-CM

## 2015-04-04 LAB — CBC WITH DIFFERENTIAL/PLATELET
BASO%: 0.9 % (ref 0.0–2.0)
BASOS ABS: 0.1 10*3/uL (ref 0.0–0.1)
EOS ABS: 0.2 10*3/uL (ref 0.0–0.5)
EOS%: 2.6 % (ref 0.0–7.0)
HEMATOCRIT: 43.2 % (ref 34.8–46.6)
HGB: 14.2 g/dL (ref 11.6–15.9)
LYMPH#: 1 10*3/uL (ref 0.9–3.3)
LYMPH%: 16.8 % (ref 14.0–49.7)
MCH: 30.3 pg (ref 25.1–34.0)
MCHC: 32.9 g/dL (ref 31.5–36.0)
MCV: 92.1 fL (ref 79.5–101.0)
MONO#: 0.6 10*3/uL (ref 0.1–0.9)
MONO%: 9.3 % (ref 0.0–14.0)
NEUT#: 4.2 10*3/uL (ref 1.5–6.5)
NEUT%: 70.4 % (ref 38.4–76.8)
PLATELETS: 227 10*3/uL (ref 145–400)
RBC: 4.68 10*6/uL (ref 3.70–5.45)
RDW: 15.1 % — ABNORMAL HIGH (ref 11.2–14.5)
WBC: 6 10*3/uL (ref 3.9–10.3)

## 2015-04-04 LAB — COMPREHENSIVE METABOLIC PANEL
ALT: 21 U/L (ref 0–55)
ANION GAP: 8 meq/L (ref 3–11)
AST: 21 U/L (ref 5–34)
Albumin: 4.5 g/dL (ref 3.5–5.0)
Alkaline Phosphatase: 59 U/L (ref 40–150)
BUN: 14.6 mg/dL (ref 7.0–26.0)
CALCIUM: 10.3 mg/dL (ref 8.4–10.4)
CHLORIDE: 103 meq/L (ref 98–109)
CO2: 29 mEq/L (ref 22–29)
CREATININE: 0.8 mg/dL (ref 0.6–1.1)
EGFR: 73 mL/min/{1.73_m2} — ABNORMAL LOW (ref >90)
Glucose: 137 mg/dl (ref 70–140)
POTASSIUM: 4.5 meq/L (ref 3.5–5.1)
Sodium: 141 mEq/L (ref 136–145)
Total Bilirubin: 0.55 mg/dL (ref 0.20–1.20)
Total Protein: 7 g/dL (ref 6.4–8.3)
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
## 2015-04-04 NOTE — Telephone Encounter (Signed)
Appointments made and avs printed for patient °

## 2015-04-05 ENCOUNTER — Ambulatory Visit (HOSPITAL_COMMUNITY)
Admission: RE | Admit: 2015-04-05 | Discharge: 2015-04-05 | Disposition: A | Discharge status: Home / Self Care | Payer: Medicare Other | Source: Ambulatory Visit | Attending: Neurology | Admitting: Neurology

## 2015-04-05 DIAGNOSIS — R413 Other amnesia: Secondary | ICD-10-CM | POA: Diagnosis not present

## 2015-04-05 DIAGNOSIS — R9082 White matter disease, unspecified: Secondary | ICD-10-CM | POA: Diagnosis not present

## 2015-04-05 DIAGNOSIS — D320 Benign neoplasm of cerebral meninges: Secondary | ICD-10-CM | POA: Insufficient documentation

## 2015-04-05 DIAGNOSIS — D329 Benign neoplasm of meninges, unspecified: Secondary | ICD-10-CM | POA: Diagnosis not present

## 2015-04-05 DIAGNOSIS — G319 Degenerative disease of nervous system, unspecified: Secondary | ICD-10-CM | POA: Insufficient documentation

## 2015-04-05 MED ORDER — GADOBENATE DIMEGLUMINE 529 MG/ML IV SOLN
15.0000 mL | Freq: Once | INTRAVENOUS | Status: AC | PRN
  Administered 2015-04-05: 15 mL via INTRAVENOUS

## 2015-04-06 ENCOUNTER — Ambulatory Visit
Admission: RE | Admit: 2015-04-06 | Discharge: 2015-04-06 | Disposition: A | Discharge status: Home / Self Care | Payer: Medicare Other | Source: Ambulatory Visit | Attending: Nurse Practitioner | Admitting: Nurse Practitioner

## 2015-04-06 DIAGNOSIS — C50411 Malignant neoplasm of upper-outer quadrant of right female breast: Secondary | ICD-10-CM

## 2015-04-06 DIAGNOSIS — N641 Fat necrosis of breast: Secondary | ICD-10-CM | POA: Diagnosis not present

## 2015-04-11 ENCOUNTER — Telehealth: Payer: Self-pay | Admitting: Neurology

## 2015-04-11 ENCOUNTER — Ambulatory Visit: Payer: Medicare Other | Admitting: Nurse Practitioner

## 2015-04-11 DIAGNOSIS — G3184 Mild cognitive impairment, so stated: Secondary | ICD-10-CM

## 2015-04-11 MED ORDER — RIVASTIGMINE TARTRATE 1.5 MG PO CAPS: ORAL_CAPSULE | ORAL | Status: DC
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
## 2015-04-25 ENCOUNTER — Encounter: Payer: Self-pay | Admitting: Gynecology

## 2015-04-25 ENCOUNTER — Ambulatory Visit (INDEPENDENT_AMBULATORY_CARE_PROVIDER_SITE_OTHER): Payer: Medicare Other | Admitting: Gynecology

## 2015-04-25 VITALS — BP 130/80 | Ht 64.0 in | Wt 161.0 lb

## 2015-04-25 DIAGNOSIS — C50911 Malignant neoplasm of unspecified site of right female breast: Secondary | ICD-10-CM

## 2015-04-25 DIAGNOSIS — Z01419 Encounter for gynecological examination (general) (routine) without abnormal findings: Secondary | ICD-10-CM | POA: Diagnosis not present

## 2015-04-25 DIAGNOSIS — N952 Postmenopausal atrophic vaginitis: Secondary | ICD-10-CM | POA: Diagnosis not present

## 2015-04-25 NOTE — Progress Notes (Signed)
Shannon Grant 05/21/1945 [id number]        70 y.o.  G3P3  for breast and pelvic exam  Past medical history,surgical history, problem list, medications, allergies, family history and social history were all reviewed and documented as reviewed in the EPIC chart.  ROS:  Performed with pertinent positives and negatives included in the history, assessment and plan.   Additional significant findings :   none   Exam: Caryn Bee assistant Filed Vitals:   04/25/15 1436  BP: 130/80  Height: 5\' 4"  (1.626 m)  Weight: 161 lb (73.029 kg)   General appearance:  Normal affect, orientation and appearance. Skin: Grossly normal HEENT: Without gross lesions.  No cervical or supraclavicular adenopathy. Thyroid normal.  Lungs:  Clear without wheezing, rales or rhonchi Cardiac: RR, without RMG Abdominal:  Soft, nontender, without masses, guarding, rebound, organomegaly or hernia Breasts:  Examined lying and sitting without masses, retractions, discharge or axillary adenopathy.  Well-healed right lumpectomy scar. Some mild nodularity in the right tail of Spence Pelvic:  Ext/BUS/vagina with atrophic changes  Cervix atrophic  Uterus anteverted, normal size, shape and contour, midline and mobile nontender   Adnexa  Without masses or tenderness    Anus and perineum  Normal   Rectovaginal  Normal sphincter tone without palpated masses or tenderness.    Assessment/Plan:  70 y.o. G3P3 female for breast and pelvic exam.   1. History of right breast cancer with lumpectomy and radiation/chemotherapy. Slight nodularity noted in the right tail of Spence. Most recently had mammography and ultrasound this month which showed fat necrosis in this area. This area has remained unchanged to the patient's self breast exam.  Reviewed this with the patient.  Will follow expectantly with self breast exams and report any changes. She will continue to follow up with her oncologist. 2. Postmenopausal/atrophic genital changes.  Without significant hot flushes, night sweats, vaginal dryness or any vaginal bleeding. Report any vaginal bleeding. 3. Pap smear 2014. No Pap smear done today. No history of significant abnormal Pap smears previously. Options to stop screening altogether as she is over the age of 67 versus less frequent screening intervals reviewed. Will readdress on annual basis. 4. DEXA 2015 normal. Plan repeat DEXA at 5 year interval. Increased calcium vitamin D. 5. Colonoscopy 2016. Repeat at their recommended interval. 6. Health maintenance. No routine lab work done as this is done at her primary physician's office. Follow up in one year, sooner as needed.   Anastasio Auerbach MD, 2:56 PM 04/25/2015

## 2015-04-25 NOTE — Patient Instructions (Signed)

## 2015-06-18 ENCOUNTER — Emergency Department (HOSPITAL_COMMUNITY): Payer: Medicare Other

## 2015-06-18 ENCOUNTER — Emergency Department (HOSPITAL_COMMUNITY)
Admission: EM | Admit: 2015-06-18 | Discharge: 2015-06-18 | Disposition: A | Discharge status: Home / Self Care | Payer: Medicare Other | Attending: Emergency Medicine | Admitting: Emergency Medicine

## 2015-06-18 ENCOUNTER — Encounter (HOSPITAL_COMMUNITY): Payer: Self-pay | Admitting: Emergency Medicine

## 2015-06-18 DIAGNOSIS — Z9221 Personal history of antineoplastic chemotherapy: Secondary | ICD-10-CM | POA: Insufficient documentation

## 2015-06-18 DIAGNOSIS — Z87891 Personal history of nicotine dependence: Secondary | ICD-10-CM | POA: Diagnosis not present

## 2015-06-18 DIAGNOSIS — I10 Essential (primary) hypertension: Secondary | ICD-10-CM | POA: Diagnosis not present

## 2015-06-18 DIAGNOSIS — Z923 Personal history of irradiation: Secondary | ICD-10-CM | POA: Insufficient documentation

## 2015-06-18 DIAGNOSIS — E7800 Pure hypercholesterolemia, unspecified: Secondary | ICD-10-CM | POA: Insufficient documentation

## 2015-06-18 DIAGNOSIS — Z79899 Other long term (current) drug therapy: Secondary | ICD-10-CM | POA: Diagnosis not present

## 2015-06-18 DIAGNOSIS — E039 Hypothyroidism, unspecified: Secondary | ICD-10-CM | POA: Diagnosis not present

## 2015-06-18 DIAGNOSIS — T189XXA Foreign body of alimentary tract, part unspecified, initial encounter: Secondary | ICD-10-CM | POA: Diagnosis not present

## 2015-06-18 DIAGNOSIS — Z8719 Personal history of other diseases of the digestive system: Secondary | ICD-10-CM | POA: Diagnosis not present

## 2015-06-18 DIAGNOSIS — Z86018 Personal history of other benign neoplasm: Secondary | ICD-10-CM | POA: Diagnosis not present

## 2015-06-18 DIAGNOSIS — Z8739 Personal history of other diseases of the musculoskeletal system and connective tissue: Secondary | ICD-10-CM | POA: Diagnosis not present

## 2015-06-18 DIAGNOSIS — Z87440 Personal history of urinary (tract) infections: Secondary | ICD-10-CM | POA: Diagnosis not present

## 2015-06-18 DIAGNOSIS — R0989 Other specified symptoms and signs involving the circulatory and respiratory systems: Secondary | ICD-10-CM

## 2015-06-18 DIAGNOSIS — Z853 Personal history of malignant neoplasm of breast: Secondary | ICD-10-CM | POA: Insufficient documentation
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
## 2015-06-18 NOTE — Discharge Instructions (Signed)
Your exam was reassuring. Your x-rays showed no evidence of a lodged foreign body in your throat or lungs. You likely swallowed the dental pick and it will pass through your digestive system without difficulty. He may experience some throat irritation over the next 2 days, but this will also resolve on its own. Return to ED if you begin to cough, drool, experience shortness of breath or other concerning symptoms. Follow-up with your doctor in the next 2 days for reevaluation.

## 2015-06-18 NOTE — ED Notes (Signed)
Pt states that she accidentally swallowed a plastic toothpick. Felt like it was stuck in her throat and now might have gone down. Sent in by [facility] for poss. Endoscopy. Alert and oriented.

## 2015-06-27 ENCOUNTER — Telehealth: Payer: Self-pay | Admitting: *Deleted

## 2015-06-27 NOTE — Telephone Encounter (Signed)
Pt called to this RN to state she has noticed over the past month "pain in my right breast "  Pain is close to surgical site- tender if " pushed on " and per inquiry by this RN pt states " seems warmer then the other breast and may be a little red"   " I can tell I have a right breast where as I am unaware I have a left breast "  Shannon Grant denies any fevers.  Per further discussion and noted area of concern at visit in January 2017 with HB/NP of area at surgical site- [name] " I think it is in the same place "  Pt has not seen surgeon recently and states " I do not have any appointments to follow up with him "   Of note pt was seen in January by NP- with " feelings of fullness in my right breast "  Studies were obtained per palpable area with outcome stating " fat necrosis "  Return call number given as [phone].

## 2015-06-27 NOTE — Telephone Encounter (Signed)
Per MD review requested for pt to be seen by her surgeon for possible post surgical issues including possible cellulitis.  This RN called [facility] and spoke with nursing - discussed concern.  Dr Ethlyn Gallery nurse will contact pt per appointment per above concern.  This RN called and discussed the above with the patient who verbalized understanding.

## 2015-07-04 ENCOUNTER — Other Ambulatory Visit: Payer: Self-pay | Admitting: Oncology

## 2015-07-04 ENCOUNTER — Telehealth: Payer: Self-pay
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
## 2015-07-04 NOTE — Telephone Encounter (Signed)
Patient call today very concerned with discomfort and what she thinks might be a lump in her right breast.  Per Dr. Jana Hakim patient can be seen today.  Patient very grateful to be seen so quickly.

## 2015-07-04 NOTE — Progress Notes (Unsigned)
Shannon Grant came today for an unscheduled visit. She had palpated something near the right lumpectomy scar and became a little bit alarmed.  She has forgotten that exactly the same thing happened in January and we did a right breast mammogram with ultrasonography and tomosynthesis at that time and radiology felt this was most likely fat necrosis.  On exam today with a great deal of difficulty I was able to feel a little bit of a change deep beneath the right breast scar. There is no erythema, tenderness, or clearly demarcated lesion. The right axilla is benign.  She is already set up for repeat mammography in June. We left it that she would have that done and then return to see me after that exam. She felt very reassured.  Incidentally I suggest that she participate in the [facility] program and I gave her the information regarding it.

## 2015-07-29 ENCOUNTER — Telehealth: Payer: Self-pay | Admitting: Oncology

## 2015-07-29 NOTE — Telephone Encounter (Signed)
s.w. pt and r/s 7.11 to 7.5 due md on call....pt ok and aware

## 2015-07-30 ENCOUNTER — Other Ambulatory Visit: Payer: Self-pay | Admitting: Oncology

## 2015-07-30 DIAGNOSIS — Z853 Personal history of malignant neoplasm of breast: Secondary | ICD-10-CM

## 2015-08-06 ENCOUNTER — Encounter: Payer: Self-pay | Admitting: Neurology

## 2015-08-06 ENCOUNTER — Ambulatory Visit: Payer: Medicare Other | Admitting: Neurology

## 2015-09-19 ENCOUNTER — Telehealth: Payer: Self-pay | Admitting: Neurology

## 2015-09-19 ENCOUNTER — Ambulatory Visit: Payer: Medicare Other | Admitting: Neurology

## 2015-09-19 NOTE — Telephone Encounter (Signed)
Daughter was confused about medication. Read Dr. Amparo Bristol note from 04/11/15 to patient.

## 2015-09-19 NOTE — Telephone Encounter (Signed)
Shannon Grant 04-Feb-2046. Her daughter [name]  called and would like you to please call her back regarding two medications Arisept and rivastigimine. She has some question's regarding those medications. She is a patient of Dr. Delice Lesch.  Her number is [phone]. Thank you

## 2015-09-28 ENCOUNTER — Ambulatory Visit
Admission: RE | Admit: 2015-09-28 | Discharge: 2015-09-28 | Disposition: A | Discharge status: Home / Self Care | Payer: Medicare Other | Source: Ambulatory Visit | Attending: Oncology | Admitting: Oncology

## 2015-09-28 DIAGNOSIS — R928 Other abnormal and inconclusive findings on diagnostic imaging of breast: Secondary | ICD-10-CM | POA: Diagnosis not present

## 2015-09-28 DIAGNOSIS — Z853 Personal history of malignant neoplasm of breast: Secondary | ICD-10-CM

## 2015-10-01 ENCOUNTER — Other Ambulatory Visit: Payer: Self-pay

## 2015-10-01 DIAGNOSIS — C50411 Malignant neoplasm of upper-outer quadrant of right female breast: Secondary | ICD-10-CM

## 2015-10-03 ENCOUNTER — Telehealth: Payer: Self-pay | Admitting: Oncology

## 2015-10-03 ENCOUNTER — Ambulatory Visit (HOSPITAL_BASED_OUTPATIENT_CLINIC_OR_DEPARTMENT_OTHER): Payer: Medicare Other | Admitting: Oncology

## 2015-10-03 ENCOUNTER — Other Ambulatory Visit (HOSPITAL_BASED_OUTPATIENT_CLINIC_OR_DEPARTMENT_OTHER): Payer: Medicare Other

## 2015-10-03 VITALS — BP 151/70 | HR 65 | Temp 98.3°F | Resp 18 | Ht 64.0 in | Wt 164.4 lb

## 2015-10-03 DIAGNOSIS — C50411 Malignant neoplasm of upper-outer quadrant of right female breast: Secondary | ICD-10-CM

## 2015-10-03 LAB — CBC WITH DIFFERENTIAL/PLATELET
BASO%: 1.2 % (ref 0.0–2.0)
Basophils Absolute: 0.1 10*3/uL (ref 0.0–0.1)
EOS ABS: 0.1 10*3/uL (ref 0.0–0.5)
EOS%: 2.9 % (ref 0.0–7.0)
HEMATOCRIT: 42 % (ref 34.8–46.6)
HEMOGLOBIN: 14 g/dL (ref 11.6–15.9)
LYMPH#: 1 10*3/uL (ref 0.9–3.3)
LYMPH%: 23.3 % (ref 14.0–49.7)
MCH: 31.1 pg (ref 25.1–34.0)
MCHC: 33.3 g/dL (ref 31.5–36.0)
MCV: 93.3 fL (ref 79.5–101.0)
MONO#: 0.6 10*3/uL (ref 0.1–0.9)
MONO%: 13.6 % (ref 0.0–14.0)
NEUT%: 59 % (ref 38.4–76.8)
NEUTROS ABS: 2.4 10*3/uL (ref 1.5–6.5)
PLATELETS: 221 10*3/uL (ref 145–400)
RBC: 4.5 10*6/uL (ref 3.70–5.45)
RDW: 13.5 % (ref 11.2–14.5)
WBC: 4.1 10*3/uL (ref 3.9–10.3)

## 2015-10-03 LAB — COMPREHENSIVE METABOLIC PANEL
ALBUMIN: 4.2 g/dL (ref 3.5–5.0)
ALK PHOS: 46 U/L (ref 40–150)
ALT: 14 U/L (ref 0–55)
ANION GAP: 10 meq/L (ref 3–11)
AST: 18 U/L (ref 5–34)
BILIRUBIN TOTAL: 0.57 mg/dL (ref 0.20–1.20)
BUN: 13.6 mg/dL (ref 7.0–26.0)
CALCIUM: 10.2 mg/dL (ref 8.4–10.4)
CO2: 25 mEq/L (ref 22–29)
CREATININE: 0.8 mg/dL (ref 0.6–1.1)
Chloride: 106 mEq/L (ref 98–109)
EGFR: 75 mL/min/{1.73_m2} — AB (ref >90)
Glucose: 103 mg/dl (ref 70–140)
Potassium: 4.5 mEq/L (ref 3.5–5.1)
Sodium: 141 mEq/L (ref 136–145)
TOTAL PROTEIN: 6.8 g/dL (ref 6.4–8.3)

## 2015-10-03 NOTE — Telephone Encounter (Signed)
per pof to sch pt appt-gave pt copy of avs °
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
## 2015-10-09 ENCOUNTER — Ambulatory Visit: Payer: Medicare Other | Admitting: Oncology

## 2015-10-09 ENCOUNTER — Other Ambulatory Visit: Payer: Medicare Other

## 2015-10-24 DIAGNOSIS — E039 Hypothyroidism, unspecified: Secondary | ICD-10-CM | POA: Diagnosis not present

## 2015-10-24 DIAGNOSIS — I10 Essential (primary) hypertension: Secondary | ICD-10-CM | POA: Diagnosis not present

## 2015-10-24 DIAGNOSIS — F0390 Unspecified dementia without behavioral disturbance: Secondary | ICD-10-CM | POA: Diagnosis not present

## 2015-10-30 ENCOUNTER — Encounter: Payer: Self-pay | Admitting: Neurology

## 2015-10-30 ENCOUNTER — Ambulatory Visit (INDEPENDENT_AMBULATORY_CARE_PROVIDER_SITE_OTHER): Payer: Medicare Other | Admitting: Neurology

## 2015-10-30 VITALS — BP 160/98 | HR 75 | Ht 64.0 in | Wt 162.0 lb

## 2015-10-30 DIAGNOSIS — R413 Other amnesia: Secondary | ICD-10-CM

## 2015-10-30 DIAGNOSIS — D329 Benign neoplasm of meninges, unspecified: Secondary | ICD-10-CM

## 2015-10-30 DIAGNOSIS — G3184 Mild cognitive impairment, so stated: Secondary | ICD-10-CM | POA: Diagnosis not present

## 2015-10-30 MED ORDER — DONEPEZIL HCL 10 MG PO TABS: ORAL_TABLET | ORAL | 3 refills | Status: DC

## 2015-10-30 NOTE — Patient Instructions (Addendum)
1. Continue Aricept 10mg  1/2 tablet for 1 month, then increase to 1 tablet daily 2. Repeat MRI brain with and without contrast in January 2018 (1 year from prior scan) 3. Continue to monitor mood 4. Physical exercise and brain stimulation exercises are important for brain health 5. Follow-up in 6 months, call for any changes
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
## 2015-11-01 ENCOUNTER — Telehealth: Payer: Self-pay | Admitting: Neurology

## 2015-11-01 NOTE — Telephone Encounter (Signed)
PT's daughter called and has a question regarding an MRI/Dawn  [phone]

## 2015-11-01 NOTE — Telephone Encounter (Signed)
Left message for Shannon Grant to call me back.

## 2015-11-05 ENCOUNTER — Telehealth: Payer: Self-pay

## 2015-11-05 ENCOUNTER — Telehealth: Payer: Self-pay | Admitting: *Deleted

## 2015-11-05 DIAGNOSIS — R413 Other amnesia: Secondary | ICD-10-CM

## 2015-11-05 DIAGNOSIS — C50411 Malignant neoplasm of upper-outer quadrant of right female breast: Secondary | ICD-10-CM

## 2015-11-05 NOTE — Telephone Encounter (Signed)
Pt called to this RN to state ongoing and nonresolving memory loss concerns since completing chemo and radiation.  " is this expected and should I see a neurologist ?"  This RN discussed above including terminology of " chemo brain ".  Plan per call is for referral to be placed to neuro rehab - Dr Valentina Shaggy.  Shannon Grant verbalized understanding and appreciation of call and plan.

## 2015-11-05 NOTE — Telephone Encounter (Signed)
Patient called after hours number to report that her recent increase in Donepezil was making her feel lightheaded, nausea, loss of appetite.  Doctor had patient reduce the medication to 1/2 tablet daily.  If she is not better in the next week she can stop the Donepezil completely.

## 2015-11-07 ENCOUNTER — Telehealth: Payer: Self-pay | Admitting: Neurology

## 2015-11-07 ENCOUNTER — Other Ambulatory Visit: Payer: Self-pay | Admitting: *Deleted

## 2015-11-07 DIAGNOSIS — C50411 Malignant neoplasm of upper-outer quadrant of right female breast: Secondary | ICD-10-CM

## 2015-11-07 DIAGNOSIS — R413 Other amnesia: Secondary | ICD-10-CM

## 2015-11-07 DIAGNOSIS — Z859 Personal history of malignant neoplasm, unspecified: Secondary | ICD-10-CM

## 2015-11-07 NOTE — Telephone Encounter (Signed)
This message came across on the fax with the after hours calls:   Patient called after hours number to report that her recent increase in Donepezil was making her feel lightheaded, nausea, loss of appetite.  Doctor had patient reduce the medication to 1/2 tablet daily.  If she is not better in the next week she can stop the Donepezil completely.  Patient's daughter says she is not doing better on 1/2 tablet but is concerned about the advice to stop completely.  Please advise.  Thanks

## 2015-11-07 NOTE — Telephone Encounter (Signed)
Shannon Grant 05/31/2045. Her daughter [name] called [phone] needing to speak with you regarding an MRI. They were told it would need to be scheduled closer in to her year appointment and they have been calling now wanting to schedule. Her daughter just had some questions. Thank you

## 2015-11-09 NOTE — Telephone Encounter (Signed)
Spoke to patients daughter [name] she is scheduled for an MRI of the brain December 2017. Patient has been taking 1/2 pill with anti nausea medication and seems to be doing ok. She will call back in a week with updates.

## 2015-11-09 NOTE — Telephone Encounter (Signed)
Noted, thanks!

## 2015-11-09 NOTE — Telephone Encounter (Signed)
Please schedule MRI brain with and without contrast for diagnosis of Meningioma for January 2018. Also, please reassure daughter that we want her mother to feel better first, then we will start a different medication. If still not feeling well, stop medication, then call us in a week to update on how she is doing. If feeling better, we will plan to start a different medication. Thanks

## 2015-11-15 ENCOUNTER — Other Ambulatory Visit: Payer: Self-pay | Admitting: *Deleted

## 2015-11-15 DIAGNOSIS — C50411 Malignant neoplasm of upper-outer quadrant of right female breast: Secondary | ICD-10-CM

## 2015-11-15 DIAGNOSIS — N6459 Other signs and symptoms in breast: Secondary | ICD-10-CM

## 2015-12-06 ENCOUNTER — Telehealth: Payer: Self-pay | Admitting: Neurology

## 2015-12-06 MED ORDER — RIVASTIGMINE 4.6 MG/24HR TD PT24: 4.6000 mg | MEDICATED_PATCH | Freq: Every day | TRANSDERMAL | 6 refills | Status: DC

## 2015-12-06 NOTE — Telephone Encounter (Signed)
Patient needs to talk to someone about medication please call [phone]

## 2015-12-06 NOTE — Telephone Encounter (Signed)
Pt called stated she has been taking Donepezil 10mg  1/2 tab and still feeling nauseous. (See previous phone note.)

## 2015-12-06 NOTE — Telephone Encounter (Signed)
Notified pt, Aricept removed from medication list, Rx sent to pharmacy.

## 2015-12-06 NOTE — Telephone Encounter (Signed)
Pls let her know to stop the Aricept. Wait 2 weeks until nausea goes away, then we will start new medication. It is a patch that she has to put on and take off every 24 hours. It is called Exelon patch, side effects may be more related to irritation where she puts the patch, follow instructions. Pls send Rx for Exelon patch 4.6mg /24hr, #30 with 6 refills. Thanks

## 2015-12-10 ENCOUNTER — Other Ambulatory Visit: Payer: Self-pay | Admitting: Family Medicine

## 2015-12-10 ENCOUNTER — Other Ambulatory Visit: Payer: Self-pay | Admitting: Oncology

## 2015-12-10 DIAGNOSIS — F0390 Unspecified dementia without behavioral disturbance: Secondary | ICD-10-CM | POA: Diagnosis not present

## 2015-12-10 DIAGNOSIS — N6310 Unspecified lump in the right breast, unspecified quadrant: Secondary | ICD-10-CM

## 2015-12-10 DIAGNOSIS — N644 Mastodynia: Secondary | ICD-10-CM

## 2015-12-10 DIAGNOSIS — C50919 Malignant neoplasm of unspecified site of unspecified female breast: Secondary | ICD-10-CM | POA: Diagnosis not present

## 2015-12-18 ENCOUNTER — Ambulatory Visit
Admission: RE | Admit: 2015-12-18 | Discharge: 2015-12-18 | Disposition: A | Discharge status: Home / Self Care | Payer: Medicare Other | Source: Ambulatory Visit | Attending: Family Medicine | Admitting: Family Medicine

## 2015-12-18 DIAGNOSIS — R928 Other abnormal and inconclusive findings on diagnostic imaging of breast: Secondary | ICD-10-CM | POA: Diagnosis not present

## 2015-12-18 DIAGNOSIS — N6310 Unspecified lump in the right breast, unspecified quadrant: Secondary | ICD-10-CM

## 2015-12-18 DIAGNOSIS — N644 Mastodynia: Secondary | ICD-10-CM

## 2015-12-18 DIAGNOSIS — N63 Unspecified lump in breast: Secondary | ICD-10-CM | POA: Diagnosis not present

## 2015-12-31 DIAGNOSIS — E039 Hypothyroidism, unspecified: Secondary | ICD-10-CM | POA: Diagnosis not present

## 2016-02-04 DIAGNOSIS — E039 Hypothyroidism, unspecified: Secondary | ICD-10-CM | POA: Diagnosis not present

## 2016-02-04 DIAGNOSIS — I10 Essential (primary) hypertension: Secondary | ICD-10-CM | POA: Diagnosis not present

## 2016-02-04 DIAGNOSIS — E782 Mixed hyperlipidemia: Secondary | ICD-10-CM | POA: Diagnosis not present

## 2016-03-21 ENCOUNTER — Inpatient Hospital Stay: Admission: RE | Admit: 2016-03-21 | Payer: Medicare Other | Source: Ambulatory Visit

## 2016-03-30 ENCOUNTER — Ambulatory Visit
Admission: RE | Admit: 2016-03-30 | Discharge: 2016-03-30 | Disposition: A | Discharge status: Home / Self Care | Payer: Medicare Other | Source: Ambulatory Visit | Attending: Neurology | Admitting: Neurology

## 2016-03-30 DIAGNOSIS — R413 Other amnesia: Secondary | ICD-10-CM | POA: Diagnosis not present

## 2016-03-30 DIAGNOSIS — D329 Benign neoplasm of meninges, unspecified: Secondary | ICD-10-CM

## 2016-03-30 MED ORDER — GADOBENATE DIMEGLUMINE 529 MG/ML IV SOLN
14.0000 mL | Freq: Once | INTRAVENOUS | Status: AC | PRN
  Administered 2016-03-30: 14 mL via INTRAVENOUS

## 2016-04-03 ENCOUNTER — Telehealth: Payer: Self-pay

## 2016-04-03 NOTE — Telephone Encounter (Signed)
LVM on home number for daughter to return my call. Notified patient on cell number of results, verbalizes understanding.

## 2016-04-03 NOTE — Telephone Encounter (Signed)
-----   Message from Cameron Sprang, MD sent at 04/03/2016  1:08 PM EST ----- Pls let pt/daughter know the MRI brain did not show any new changes, the meningioma is stable. Thanks.

## 2016-04-10 ENCOUNTER — Other Ambulatory Visit: Payer: Self-pay | Admitting: Nurse Practitioner

## 2016-04-25 ENCOUNTER — Encounter: Payer: Medicare Other | Admitting: Gynecology

## 2016-04-28 DIAGNOSIS — E782 Mixed hyperlipidemia: Secondary | ICD-10-CM | POA: Diagnosis not present

## 2016-05-01 ENCOUNTER — Encounter: Payer: Self-pay | Admitting: Neurology

## 2016-05-01 ENCOUNTER — Ambulatory Visit (INDEPENDENT_AMBULATORY_CARE_PROVIDER_SITE_OTHER): Payer: Medicare Other | Admitting: Neurology

## 2016-05-01 VITALS — BP 146/84 | HR 73 | Ht 64.0 in | Wt 158.0 lb

## 2016-05-01 DIAGNOSIS — D329 Benign neoplasm of meninges, unspecified: Secondary | ICD-10-CM | POA: Diagnosis not present

## 2016-05-01 DIAGNOSIS — G3184 Mild cognitive impairment, so stated: Secondary | ICD-10-CM | POA: Diagnosis not present

## 2016-05-01 NOTE — Patient Instructions (Addendum)
1. Schedule Neurocognitive testing with Dr. Si Raider 2. Continue Donepezil 10mg  daily 3. Continue control of blood pressure, cholesterol, as well as physical exercise and brain stimulation exercises for brain health 4. Follow-up after Neurocognitive testing  You have been referred for a neurocognitive evaluation in our office.   The evaluation consists of three appointments.   1. The first appointment is about 45 minutes and is a clinical interview with the neuropsychologist (Dr. Macarthur Critchley). Please bring someone with you to this appointment if possible, as it is helpful for Dr. Si Raider to hear from both you and another adult who knows you well.   2. The second appointment is 2-3 hours long and is with the psychometrician Milana Kidney). You will complete a variety of tasks- mostly question-and-answer, some paper-and-pencil. There is nothing you need to do to prepare for this appointment, but having a good night's sleep prior to the testing, and bringing eyeglasses and hearing aids (if you wear them), is advised.   3. The final appointment is a follow-up with Dr. Si Raider where she will go over the test results with you and provide recommendations and a plan of care. This appointment is about 30 minutes.  If you would like a family member to receive this information as well, please bring them to the appointment.   We have to reserve several hours of the neuropsychologist's time and the psychometrician's time for your appointment. As such, please note that there is a No-Show fee of $100. If you are unable to attend any of your appointments, please contact our office as soon as possible to reschedule.
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
## 2016-05-12 ENCOUNTER — Encounter: Payer: Self-pay | Admitting: Neurology

## 2016-05-15 ENCOUNTER — Other Ambulatory Visit: Payer: Self-pay

## 2016-05-15 MED ORDER — DONEPEZIL HCL 10 MG PO TABS: 10.0000 mg | ORAL_TABLET | Freq: Every day | ORAL | 5 refills | Status: DC

## 2016-06-03 ENCOUNTER — Encounter: Payer: Self-pay | Admitting: Psychology

## 2016-06-03 ENCOUNTER — Ambulatory Visit (INDEPENDENT_AMBULATORY_CARE_PROVIDER_SITE_OTHER): Payer: Medicare Other | Admitting: Psychology

## 2016-06-03 DIAGNOSIS — R413 Other amnesia: Secondary | ICD-10-CM
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
## 2016-06-11 ENCOUNTER — Ambulatory Visit (INDEPENDENT_AMBULATORY_CARE_PROVIDER_SITE_OTHER): Payer: Medicare Other | Admitting: Psychology

## 2016-06-11 DIAGNOSIS — R413 Other amnesia: Secondary | ICD-10-CM

## 2016-06-11 NOTE — Progress Notes (Signed)
   Neuropsychology Note  Shannon Grant returned today for 2 hours of neuropsychological testing with technician, Milana Kidney, BS, under the supervision of Dr. Macarthur Critchley. The patient did not appear overtly distressed by the testing session, per behavioral observation or via self-report to the technician. Rest breaks were offered. Shannon Grant will return within 2 weeks for a feedback session with Dr. Si Raider at which time her test performances, clinical impressions and treatment recommendations will be reviewed in detail. The patient understands she can contact our office should she require our assistance before this time.  Full report to follow.
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
## 2016-07-03 ENCOUNTER — Ambulatory Visit (INDEPENDENT_AMBULATORY_CARE_PROVIDER_SITE_OTHER): Payer: Medicare Other | Admitting: Psychology

## 2016-07-03 ENCOUNTER — Encounter: Payer: Self-pay | Admitting: Psychology

## 2016-07-03 DIAGNOSIS — F320 Major depressive disorder, single episode, mild: Secondary | ICD-10-CM | POA: Diagnosis not present

## 2016-07-03 DIAGNOSIS — F0280 Dementia in other diseases classified elsewhere without behavioral disturbance: Secondary | ICD-10-CM

## 2016-07-03 DIAGNOSIS — F0390 Unspecified dementia without behavioral disturbance: Secondary | ICD-10-CM

## 2016-07-03 DIAGNOSIS — G301 Alzheimer's disease with late onset: Secondary | ICD-10-CM

## 2016-07-03 NOTE — Patient Instructions (Signed)
--  Continue Aricept --Due to memory difficulties, it is recommended that you limit your driving to familiar locations in optimal conditions (e.g., not at night or in inclement weather; avoid high traffic areas).  --If you do not have healthcare and financial PoA in place, this is recommended. Other advance directives including living will and estate planning should also be carried out. You may wish to start thinking about residential options that will meet your needs should they increase over time (e.g., continuing care retirement communities). At this time, I feel it is safe for you to live independently, but someone should have oversight of medication, finances and appointments to ensure that mistakes are not made. Your daughter or other trusted individual should attend all important appointments with you. -- Continue to use external memory aides (e.g., daily log, alarm reminders, calendar, written to do list) to compensate for memory deficits.  -- In order to promote brain health and maintain quality of life, you are encouraged to continue participating in activities that provide social interaction, mental stimulation and safe cardiovascular exercise. Routine and structure in daily life are also recommended to maximize cognitive functioning.  -- I would like to see you back for re-evaluation in one year.

## 2016-07-28 ENCOUNTER — Telehealth: Payer: Self-pay | Admitting: Neurology

## 2016-07-28 NOTE — Telephone Encounter (Signed)
-----   Message from Sherlynn Carbon sent at 07/28/2016  7:56 AM EDT ----- PT left a message to cancel today's appointment/Shannon Grant

## 2016-07-28 NOTE — Telephone Encounter (Signed)
Called pt - let her know that she did not have an appt scheduled for today.  She had gotten her dates confused as her appt is scheduled for May 30th. She states she would like to keep that appt.

## 2016-08-27 ENCOUNTER — Ambulatory Visit (INDEPENDENT_AMBULATORY_CARE_PROVIDER_SITE_OTHER): Payer: Medicare Other | Admitting: Neurology

## 2016-08-27 ENCOUNTER — Encounter: Payer: Self-pay | Admitting: Neurology

## 2016-08-27 VITALS — BP 132/78 | HR 67 | Ht 64.0 in | Wt 156.0 lb

## 2016-08-27 DIAGNOSIS — G301 Alzheimer's disease with late onset: Secondary | ICD-10-CM

## 2016-08-27 DIAGNOSIS — D329 Benign neoplasm of meninges, unspecified: Secondary | ICD-10-CM

## 2016-08-27 DIAGNOSIS — F0280 Dementia in other diseases classified elsewhere without behavioral disturbance: Secondary | ICD-10-CM | POA: Diagnosis not present

## 2016-08-27 MED ORDER — DONEPEZIL HCL 10 MG PO TABS: 10.0000 mg | ORAL_TABLET | Freq: Every day | ORAL | 3 refills | Status: DC
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
## 2016-08-27 NOTE — Patient Instructions (Addendum)
1. Continue Donepezil 10mg  daily 2. Schedule MRI brain with and without contrast for December 2018 3. Control of blood pressure, cholesterol, as well as physical exercise and brain stimulation exercises are important for brain health 4. Continue to monitor driving 5. Schedule repeat Neurocognitive testing with Dr. Si Raider for April 2019 6. Follow-up in 7 months (after MRI), call for any changes

## 2016-09-08 ENCOUNTER — Inpatient Hospital Stay: Admission: RE | Admit: 2016-09-08 | Payer: Medicare Other | Source: Ambulatory Visit

## 2016-09-15 ENCOUNTER — Ambulatory Visit
Admission: RE | Admit: 2016-09-15 | Discharge: 2016-09-15 | Disposition: A | Discharge status: Home / Self Care | Payer: Medicare Other | Source: Ambulatory Visit | Attending: Neurology | Admitting: Neurology

## 2016-09-15 DIAGNOSIS — F0280 Dementia in other diseases classified elsewhere without behavioral disturbance: Secondary | ICD-10-CM

## 2016-09-15 DIAGNOSIS — D329 Benign neoplasm of meninges, unspecified: Secondary | ICD-10-CM

## 2016-09-15 DIAGNOSIS — G301 Alzheimer's disease with late onset: Principal | ICD-10-CM

## 2016-09-15 MED ORDER — GADOBENATE DIMEGLUMINE 529 MG/ML IV SOLN
14.0000 mL | Freq: Once | INTRAVENOUS | Status: AC | PRN
  Administered 2016-09-15: 14 mL via INTRAVENOUS

## 2016-09-18 ENCOUNTER — Telehealth: Payer: Self-pay

## 2016-09-18 NOTE — Telephone Encounter (Signed)
Spoke with pt relaying message below.  Pt appreciative.  

## 2016-09-18 NOTE — Telephone Encounter (Signed)
-----   Message from Cameron Sprang, MD sent at 09/17/2016  4:08 PM EDT ----- Pls let patient and daughter know the brain scan looks good, no changes from prior scan. Thanks

## 2016-09-29 ENCOUNTER — Other Ambulatory Visit: Payer: Self-pay | Admitting: *Deleted

## 2016-09-29 ENCOUNTER — Other Ambulatory Visit: Payer: Self-pay | Admitting: Oncology

## 2016-09-29 DIAGNOSIS — C50411 Malignant neoplasm of upper-outer quadrant of right female breast: Secondary | ICD-10-CM

## 2016-09-30 ENCOUNTER — Ambulatory Visit
Admission: RE | Admit: 2016-09-30 | Discharge: 2016-09-30 | Disposition: A | Discharge status: Home / Self Care | Payer: Medicare Other | Source: Ambulatory Visit | Attending: Oncology | Admitting: Oncology

## 2016-09-30 HISTORY — DX: Personal history of antineoplastic chemotherapy: Z92.21

## 2016-09-30 HISTORY — DX: Personal history of irradiation: Z92.3
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
## 2016-10-02 ENCOUNTER — Ambulatory Visit (HOSPITAL_BASED_OUTPATIENT_CLINIC_OR_DEPARTMENT_OTHER): Payer: Medicare Other | Admitting: Oncology

## 2016-10-02 ENCOUNTER — Other Ambulatory Visit (HOSPITAL_BASED_OUTPATIENT_CLINIC_OR_DEPARTMENT_OTHER): Payer: Medicare Other

## 2016-10-02 VITALS — BP 183/73 | HR 62 | Temp 98.3°F | Resp 18 | Ht 64.0 in | Wt 157.5 lb

## 2016-10-02 DIAGNOSIS — Z171 Estrogen receptor negative status [ER-]: Secondary | ICD-10-CM

## 2016-10-02 DIAGNOSIS — C50411 Malignant neoplasm of upper-outer quadrant of right female breast: Secondary | ICD-10-CM

## 2016-10-02 LAB — CBC WITH DIFFERENTIAL/PLATELET
BASO%: 1.2 % (ref 0.0–2.0)
BASOS ABS: 0.1 10*3/uL (ref 0.0–0.1)
EOS ABS: 0.1 10*3/uL (ref 0.0–0.5)
EOS%: 2.6 % (ref 0.0–7.0)
HCT: 42.6 % (ref 34.8–46.6)
HEMOGLOBIN: 14.3 g/dL (ref 11.6–15.9)
LYMPH%: 18.7 % (ref 14.0–49.7)
MCH: 31.4 pg (ref 25.1–34.0)
MCHC: 33.6 g/dL (ref 31.5–36.0)
MCV: 93.3 fL (ref 79.5–101.0)
MONO#: 0.6 10*3/uL (ref 0.1–0.9)
MONO%: 11.6 % (ref 0.0–14.0)
NEUT#: 3.2 10*3/uL (ref 1.5–6.5)
NEUT%: 65.9 % (ref 38.4–76.8)
PLATELETS: 217 10*3/uL (ref 145–400)
RBC: 4.57 10*6/uL (ref 3.70–5.45)
RDW: 13.8 % (ref 11.2–14.5)
WBC: 4.8 10*3/uL (ref 3.9–10.3)
lymph#: 0.9 10*3/uL (ref 0.9–3.3)

## 2016-10-02 LAB — COMPREHENSIVE METABOLIC PANEL
ALBUMIN: 4.4 g/dL (ref 3.5–5.0)
ALK PHOS: 58 U/L (ref 40–150)
ALT: 28 U/L (ref 0–55)
ANION GAP: 11 meq/L (ref 3–11)
AST: 27 U/L (ref 5–34)
BILIRUBIN TOTAL: 0.62 mg/dL (ref 0.20–1.20)
BUN: 16.9 mg/dL (ref 7.0–26.0)
CALCIUM: 10.4 mg/dL (ref 8.4–10.4)
CO2: 26 mEq/L (ref 22–29)
Chloride: 102 mEq/L (ref 98–109)
Creatinine: 0.8 mg/dL (ref 0.6–1.1)
EGFR: 78 mL/min/{1.73_m2} — AB (ref >90)
Glucose: 95 mg/dl (ref 70–140)
POTASSIUM: 4.2 meq/L (ref 3.5–5.1)
SODIUM: 140 meq/L (ref 136–145)
Total Protein: 7 g/dL (ref 6.4–8.3)

## 2016-10-31 ENCOUNTER — Telehealth: Payer: Self-pay | Admitting: Oncology

## 2016-10-31 NOTE — Telephone Encounter (Signed)
Scheduled next available per sch message from [facility] - patient is aware of new appt date and time.

## 2016-11-05 ENCOUNTER — Ambulatory Visit (HOSPITAL_BASED_OUTPATIENT_CLINIC_OR_DEPARTMENT_OTHER): Payer: Medicare Other | Admitting: Oncology

## 2016-11-05 VITALS — BP 164/70 | HR 71 | Temp 98.5°F | Resp 18 | Ht 64.0 in | Wt 155.4 lb

## 2016-11-05 DIAGNOSIS — Z171 Estrogen receptor negative status [ER-]: Secondary | ICD-10-CM | POA: Diagnosis not present

## 2016-11-05 DIAGNOSIS — C50411 Malignant neoplasm of upper-outer quadrant of right female breast: Secondary | ICD-10-CM | POA: Diagnosis not present
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
## 2016-11-10 ENCOUNTER — Other Ambulatory Visit: Payer: Self-pay | Admitting: *Deleted

## 2016-11-10 ENCOUNTER — Other Ambulatory Visit: Payer: Self-pay | Admitting: Oncology
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
## 2016-11-11 ENCOUNTER — Telehealth: Payer: Self-pay | Admitting: *Deleted

## 2016-11-11 NOTE — Telephone Encounter (Signed)
This RN returned call to pt on 11/10/2016 per her message stating " I am concerned that my cancer has recurred but I am not scheduled to see Dr Jana Hakim until 12/03/2016"  This RN could not locate any data per [facility] regarding recurrence of breast cancer.  Pt was seen by Dr Jannifer Rodney on 11/05/2016 with documentation of review of mammogram obtained 09/30/2016 showing NED.  Next scheduled appointment is for 1 year with appointment on 12/03/2016 cancelled to reschedule to above date.  This RN reviewed above with pt who states she has information written and on My Chart stating " recurrence of breast cancer in mammogram obtained in 11/05/2016 "  Per discussion pt was very congruent in her concerns as well as stating she " has already told 3 people that my cancer has recurred "  This RN reviewed in detail known documentation verifying that she did not have a mammo on 11/05/2016 but on 09/30/2016 which showed normal readings.  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  While discussing above with pt this RN reviewed documentation from her Neurologist and noted pt is under care for memory issues with her daughter involved in her care.  This RN then attempted to reassure pt of her concerns- as well as possible misunderstanding of data put on My Chart that may be confusing her.  Per end of call - plan is that this nurse would follow up tomorrow ( 11/11/2016 ) per her concerns.  Today a VM was left by the patient's daughter - [name] - who is documented [facility] POA as well as on ROI - stating concerns above and need for clarification.  This RN returned call to [name] and discussed conversation that occurred pm 8/13 and again verifying with [name] that her mother does not have evidence of disease recurrence.  This RN stated concern that due to conversation with her mother and that this RN was not aware  of memory issues at beginning of conversation- this RN may have caused greater confusion.  [name] verified that her mother is under treatment for early stage Alzheimer's disease and that she goes with her mom to her neurology appointments " but I am still trying to allow my mother her privacy and not interfere too much " " her appointments for follow up with Dr Jana Hakim have been pretty routine since she completed chemo so I have not come with her "  This RN validated her care with her mother.   Plan per above is [name] will talk with her mother- discuss that per contact today and review of concerns that it is verified that [place] does not have any evidence of recurrence of her breast cancer.  If pt perseverates on above concern- plan is to schedule a time that her and her daughter can come to be reassured of breast cancer status.  This RN gave [name] the direct number to reach Dr [facility] nurse for continuity of care and communication.

## 2016-11-20 ENCOUNTER — Ambulatory Visit (HOSPITAL_BASED_OUTPATIENT_CLINIC_OR_DEPARTMENT_OTHER): Payer: Medicare Other | Admitting: Adult Health

## 2016-11-20 ENCOUNTER — Encounter: Payer: Self-pay | Admitting: Adult Health

## 2016-11-20 VITALS — BP 181/63 | HR 59 | Temp 98.3°F | Resp 18 | Ht 64.0 in | Wt 152.6 lb

## 2016-11-20 DIAGNOSIS — Z171 Estrogen receptor negative status [ER-]: Principal | ICD-10-CM

## 2016-11-20 DIAGNOSIS — Z853 Personal history of malignant neoplasm of breast: Secondary | ICD-10-CM | POA: Diagnosis not present

## 2016-11-20 DIAGNOSIS — C50411 Malignant neoplasm of upper-outer quadrant of right female breast: Secondary | ICD-10-CM
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
## 2016-12-24 ENCOUNTER — Telehealth: Payer: Self-pay | Admitting: *Deleted

## 2016-12-24 NOTE — Telephone Encounter (Signed)
This RN contacted pt per her call stating concern with obtaining a new insurance plan " and now that my breast cancer has recurred the insurance co will consider it a pre existing condition and not cover it ".  This RN reiterated to pt that she does not have recurrent breast cancer - reviewed with her last mammogram reading and visit with LCC/NP verifying the above.  Note Shannon Grant was also confused about her follow up appointments - which this RN clarified for her.   Pt easily reoriented to understanding that she does not have recurrent breast cancer.  Post above call this RN did call and speak with her daughter, [name], just as an FYI for continuity in communication and pt concerns due to known early dementia.

## 2017-01-20 ENCOUNTER — Telehealth: Payer: Self-pay | Admitting: *Deleted

## 2017-01-20 NOTE — Telephone Encounter (Signed)
This RN spoke with the patient's daughter - Dortha Schwalbe per her concern due to her mother's memory issues " and she continues to have anxiety that her cancer has recurred and nothing is being done "  See prior phone notes and visit with [place] in August per above concerns.  [name] is hoping pt could be seen prior to current follow up appointment in November.  Note Jamelah has an ongoing rash that is concerning her as well as " she has her usual lumps but she thinks these are cancer "  Per above - MD schedule reviewed with noted cancellations for tomorrow that could facilitate a visit with Dr Jana Hakim to discuss her concerns and have a concrete memory with her daughter present.  [name] appreciated above assistance.  Appointment made by this RN.

## 2017-01-21 ENCOUNTER — Ambulatory Visit (HOSPITAL_BASED_OUTPATIENT_CLINIC_OR_DEPARTMENT_OTHER): Payer: Medicare Other | Admitting: Oncology

## 2017-01-21 ENCOUNTER — Other Ambulatory Visit: Payer: Self-pay | Admitting: *Deleted

## 2017-01-21 VITALS — BP 201/86 | HR 71 | Temp 98.3°F | Resp 18 | Ht 64.0 in | Wt 154.2 lb

## 2017-01-21 DIAGNOSIS — R21 Rash and other nonspecific skin eruption: Secondary | ICD-10-CM

## 2017-01-21 DIAGNOSIS — Z171 Estrogen receptor negative status [ER-]: Secondary | ICD-10-CM | POA: Diagnosis not present

## 2017-01-21 DIAGNOSIS — C50411 Malignant neoplasm of upper-outer quadrant of right female breast: Secondary | ICD-10-CM | POA: Diagnosis not present
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
## 2017-01-22 ENCOUNTER — Ambulatory Visit
Admission: RE | Admit: 2017-01-22 | Discharge: 2017-01-22 | Disposition: A | Discharge status: Home / Self Care | Payer: Medicare Other | Source: Ambulatory Visit | Attending: Oncology | Admitting: Oncology

## 2017-01-22 DIAGNOSIS — C50411 Malignant neoplasm of upper-outer quadrant of right female breast: Secondary | ICD-10-CM

## 2017-01-22 DIAGNOSIS — Z171 Estrogen receptor negative status [ER-]: Principal | ICD-10-CM

## 2017-01-26 ENCOUNTER — Telehealth: Payer: Self-pay | Admitting: Oncology

## 2017-01-26 NOTE — Telephone Encounter (Signed)
Scheduled appt per 10/24 los - lab and f/u mid August 2019 - sent reminder letter in the mail.

## 2017-01-29 ENCOUNTER — Encounter: Payer: Self-pay | Admitting: Oncology

## 2017-01-29 ENCOUNTER — Other Ambulatory Visit: Payer: Self-pay | Admitting: Oncology
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
## 2017-02-04 ENCOUNTER — Telehealth: Payer: Self-pay

## 2017-02-04 NOTE — Telephone Encounter (Signed)
Per dtr [name] request, letter emailed to her and hard copy mailed to pt.

## 2017-02-20 ENCOUNTER — Ambulatory Visit (HOSPITAL_BASED_OUTPATIENT_CLINIC_OR_DEPARTMENT_OTHER): Payer: Medicare Other | Admitting: Adult Health

## 2017-02-20 ENCOUNTER — Ambulatory Visit: Payer: Medicare Other | Admitting: Adult Health

## 2017-02-20 ENCOUNTER — Encounter: Payer: Self-pay | Admitting: Adult Health

## 2017-02-20 VITALS — BP 176/69 | HR 61 | Temp 97.5°F | Resp 20 | Ht 64.0 in | Wt 152.1 lb

## 2017-02-20 DIAGNOSIS — C50411 Malignant neoplasm of upper-outer quadrant of right female breast: Secondary | ICD-10-CM

## 2017-02-20 DIAGNOSIS — Z171 Estrogen receptor negative status [ER-]: Secondary | ICD-10-CM | POA: Diagnosis not present

## 2017-02-20 DIAGNOSIS — R21 Rash and other nonspecific skin eruption: Secondary | ICD-10-CM
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
## 2017-03-09 ENCOUNTER — Ambulatory Visit: Payer: Medicare Other | Admitting: Neurology

## 2017-03-12 ENCOUNTER — Ambulatory Visit: Payer: Medicare Other | Admitting: Neurology

## 2017-04-24 ENCOUNTER — Encounter: Payer: Self-pay | Admitting: Neurology

## 2017-04-24 ENCOUNTER — Other Ambulatory Visit: Payer: Self-pay

## 2017-04-24 ENCOUNTER — Ambulatory Visit: Payer: Medicare Other | Admitting: Neurology

## 2017-04-24 VITALS — BP 168/90 | HR 74 | Ht 64.0 in | Wt 154.0 lb

## 2017-04-24 DIAGNOSIS — F0280 Dementia in other diseases classified elsewhere without behavioral disturbance: Secondary | ICD-10-CM

## 2017-04-24 DIAGNOSIS — G301 Alzheimer's disease with late onset: Secondary | ICD-10-CM | POA: Diagnosis not present

## 2017-04-24 DIAGNOSIS — D329 Benign neoplasm of meninges, unspecified: Secondary | ICD-10-CM | POA: Diagnosis not present

## 2017-04-24 MED ORDER — DONEPEZIL HCL 10 MG PO TABS: 10.0000 mg | ORAL_TABLET | Freq: Every day | ORAL | 3 refills | Status: DC
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
## 2017-04-24 NOTE — Patient Instructions (Signed)
1. Continue Donepezil 10mg  daily 2. Continue to monitor mood 3. Proceed with Neurocognitive testing in April 4. Follow-up in 4 months, call for any changes  FALL PRECAUTIONS: Be cautious when walking. Scan the area for obstacles that may increase the risk of trips and falls. When getting up in the mornings, sit up at the edge of the bed for a few minutes before getting out of bed. Consider elevating the bed at the head end to avoid drop of blood pressure when getting up. Walk always in a well-lit room (use night lights in the walls). Avoid area rugs or power cords from appliances in the middle of the walkways. Use a walker or a cane if necessary and consider physical therapy for balance exercise. Get your eyesight checked regularly.  FINANCIAL OVERSIGHT: Supervision, especially oversight when making financial decisions or transactions is also recommended.  HOME SAFETY: Consider the safety of the kitchen when operating appliances like stoves, microwave oven, and blender. Consider having supervision and share cooking responsibilities until no longer able to participate in those. Accidents with firearms and other hazards in the house should be identified and addressed as well.  DRIVING: Regarding driving, in patients with progressive memory problems, driving will be impaired. We advise to have someone else do the driving if trouble finding directions or if minor accidents are reported. Independent driving assessment is available to determine safety of driving.  ABILITY TO BE LEFT ALONE: If patient is unable to contact 911 operator, consider using LifeLine, or when the need is there, arrange for someone to stay with patients. Smoking is a fire hazard, consider supervision or cessation. Risk of wandering should be assessed by caregiver and if detected at any point, supervision and safe proof recommendations should be instituted.  MEDICATION SUPERVISION: Inability to self-administer medication needs to be  constantly addressed. Implement a mechanism to ensure safe administration of the medications.  RECOMMENDATIONS FOR ALL PATIENTS WITH MEMORY PROBLEMS: 1. Continue to exercise (Recommend 30 minutes of walking everyday, or 3 hours every week) 2. Increase social interactions - continue going to [place] and enjoy social gatherings with friends and family 3. Eat healthy, avoid fried foods and eat more fruits and vegetables 4. Maintain adequate blood pressure, blood sugar, and blood cholesterol level. Reducing the risk of stroke and cardiovascular disease also helps promoting better memory. 5. Avoid stressful situations. Live a simple life and avoid aggravations. Organize your time and prepare for the next day in anticipation. 6. Sleep well, avoid any interruptions of sleep and avoid any distractions in the bedroom that may interfere with adequate sleep quality 7. Avoid sugar, avoid sweets as there is a strong link between excessive sugar intake, diabetes, and cognitive impairment We discussed the Mediterranean diet, which has been shown to help patients reduce the risk of progressive memory disorders and reduces cardiovascular risk. This includes eating fish, eat fruits and green leafy vegetables, nuts like almonds and hazelnuts, walnuts, and also use olive oil. Avoid fast foods and fried foods as much as possible. Avoid sweets and sugar as sugar use has been linked to worsening of memory function.  There is always a concern of gradual progression of memory problems. If this is the case, then we may need to adjust level of care according to patient needs. Support, both to the patient and caregiver, should then be put into place.

## 2017-04-29 ENCOUNTER — Encounter: Payer: Self-pay | Admitting: Neurology

## 2017-07-06 ENCOUNTER — Encounter: Payer: Self-pay | Admitting: Psychology

## 2017-07-06 ENCOUNTER — Ambulatory Visit: Payer: Medicare Other | Admitting: Psychology

## 2017-07-06 ENCOUNTER — Ambulatory Visit (INDEPENDENT_AMBULATORY_CARE_PROVIDER_SITE_OTHER): Payer: Medicare Other | Admitting: Psychology

## 2017-07-06 DIAGNOSIS — F0280 Dementia in other diseases classified elsewhere without behavioral disturbance: Secondary | ICD-10-CM | POA: Diagnosis not present

## 2017-07-06 DIAGNOSIS — G301 Alzheimer's disease with late onset: Secondary | ICD-10-CM

## 2017-07-06 NOTE — Progress Notes (Addendum)
   Neuropsychology Note  LUANE ROCHON completed 60 minutes of neuropsychological testing with technician, Milana Kidney, BS, under the supervision of Dr. Macarthur Critchley, Licensed Psychologist. The patient did not appear overtly distressed by the testing session, per behavioral observation or via self-report to the technician. Rest breaks were offered.   Clinical Decision Making: In considering the patient's current level of functioning, level of presumed impairment, nature of symptoms, emotional and behavioral responses during the interview, level of literacy, and observed level of motivation/effort, a battery of tests was selected and communicated to the psychometrician.  Communication between the psychologist and technician was ongoing throughout the testing session and changes were made as deemed necessary based on patient performance on testing, technician observations and additional pertinent factors such as those listed above.  Shannon Grant will return within approximately 2 weeks for an interactive feedback session with Dr. Si Raider at which time her test performances, clinical impressions and treatment recommendations will be reviewed in detail. The patient understands she can contact our office should she require our assistance before this time.  20 minutes spent performing neuropsychological evaluation services/clinical decision making (psychologist). [CPT 99371] 60 minutes spent face-to-face with patient administering standardized tests, 30 minutes spent scoring (technician). [CPT [id number], 69678]  Full report to follow.
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
## 2017-07-13 ENCOUNTER — Ambulatory Visit (INDEPENDENT_AMBULATORY_CARE_PROVIDER_SITE_OTHER): Payer: Medicare Other | Admitting: Psychology

## 2017-07-13 ENCOUNTER — Encounter: Payer: Self-pay | Admitting: Psychology

## 2017-07-13 DIAGNOSIS — F0280 Dementia in other diseases classified elsewhere without behavioral disturbance: Secondary | ICD-10-CM | POA: Diagnosis not present

## 2017-07-13 DIAGNOSIS — G301 Alzheimer's disease with late onset: Secondary | ICD-10-CM

## 2017-07-13 NOTE — Patient Instructions (Signed)
Description of Test Results:  Premorbid verbal intellectual abilities were estimated to have been within the high average range based on a test of word reading last year. Psychomotor processing speed ranged from low average to severely impaired (reduced relative to 2018). Auditory attention and working memory were average to low average (improved relative to 2018). Visual-spatial construction was average (stable if not improved). Language abilities were variable. Specifically, confrontation naming was high average (stable), while semantic verbal fluency was borderline impaired (reduced relative to 2018). Auditory comprehension of complex ideational material was below expectation. With regard to verbal memory, encoding and acquisition of non-contextual information (i.e., word list) was borderline (stable). After a brief distracter task, free recall was impaired (3/9 items, stable). After a delay, free recall was borderline (2/9 items, stable). Cued recall was low average (4/9 items, improved). Performance on a yes/no recognition task was severely impaired due to high number of false positive errors (reduced). Performance on a forced choice recognition task also was below average (stable). On another verbal memory test, encoding and acquisition of contextual auditory information (i.e., short stories) was borderline (stable). After a delay, free recall was impaired (stable). Performance on a yes/no recognition task was severely impaired, at chance level (reduced). With regard to non-verbal memory, delayed free recall of visual information was borderline (improved). Executive functioning was variable. Mental flexibility and set-shifting were low average on Trails B (stable) although she did commit three set loss errors. Verbal fluency with phonemic search restrictions was low average (reduced). Verbal abstract reasoning was average (significantly reduced). Non-verbal abstract reasoning was average (stable).  Performance on a clock drawing task was normal (improved). On self-report measures of mood, the patient's responses were not indicative of clinically significant depression or anxiety at the present time.    Clinical Impressions: Mild dementia most likely secondary to Alzheimer's disease -- mild interval decline in cognitive functioning relative to last year's evaluation, not greater than expected for probable etiology.  Results of the current evaluation continue to reveal cognitive impairment in several domains, most prominently in memory encoding and consolidation. Relative to last year's performances, there is evidence of interval decline in psychomotor processing speed, recognition memory, and verbal fluency. Her cognitive profile and the areas of decline in the past year remain consistent with probable Alzheimer's disease. I would still characterize this as mild stage of dementia. Fortunately the patient is not reporting any significant depression or anxiety. I also do not see signs of behavioral disturbance associated with her dementia at this time.   Recommendations: 1. I highly recommend increased support/assistance with instrumental ADLs.  --The patient is encouraged to limit her driving to familiar local areas in low traffic during the day.  --I would advise her to have someone look over her medications routinely to ensure they are being taken correctly.  --I would also advise her to have a financial PoA placed on her accounts who can review her bank account and ensure no errors are being made.  2. To prepare for the future and maintain an appropriate level of care/support, I would also advise her to explore options for increased care that she may need in the future, including continuing care communities where she could be in independent living but have services assist with oversight of medication, appointments, etc., for now and then be able to increase care/assistance support as needed in the  future.  3. Regardless of living arrangement, she is encouraged to stick to a regular routine/schedule, incorporate socially and mentally stimulating activities, and  engage in safe regular cardiovascular exercise.  4. Aricept appears appropriate to continue based on the findings of this examination. 5. Ongoing education and support will be beneficial for her daughter and other family members. I recommend information and resources from Alzheimer's Association ([email or web address]) and Tax adviser.

## 2017-07-28 ENCOUNTER — Telehealth: Payer: Self-pay

## 2017-07-28 NOTE — Telephone Encounter (Signed)
Called to verify appointment in Aug. Spoke with patient and she declined a calender. Per 4/30 phone message return.

## 2017-08-19 ENCOUNTER — Ambulatory Visit: Payer: Medicare Other | Admitting: Neurology

## 2017-08-19 ENCOUNTER — Other Ambulatory Visit: Payer: Self-pay

## 2017-08-19 ENCOUNTER — Encounter: Payer: Self-pay | Admitting: Neurology

## 2017-08-19 VITALS — BP 150/86 | HR 66 | Ht 64.0 in | Wt 149.0 lb

## 2017-08-19 DIAGNOSIS — G301 Alzheimer's disease with late onset: Secondary | ICD-10-CM | POA: Diagnosis not present

## 2017-08-19 DIAGNOSIS — F0280 Dementia in other diseases classified elsewhere without behavioral disturbance: Secondary | ICD-10-CM | POA: Diagnosis not present

## 2017-08-19 MED ORDER — DONEPEZIL HCL 10 MG PO TABS: 10.0000 mg | ORAL_TABLET | Freq: Every day | ORAL | 3 refills | Status: DC

## 2017-08-19 NOTE — Patient Instructions (Signed)
1. Continue Donepezil 10mg  daily 2. Continue to monitor driving 3. Follow-up in 6 months, call for any changes  FALL PRECAUTIONS: Be cautious when walking. Scan the area for obstacles that may increase the risk of trips and falls. When getting up in the mornings, sit up at the edge of the bed for a few minutes before getting out of bed. Consider elevating the bed at the head end to avoid drop of blood pressure when getting up. Walk always in a well-lit room (use night lights in the walls). Avoid area rugs or power cords from appliances in the middle of the walkways. Use a walker or a cane if necessary and consider physical therapy for balance exercise. Get your eyesight checked regularly.  FINANCIAL OVERSIGHT: Supervision, especially oversight when making financial decisions or transactions is also recommended.  HOME SAFETY: Consider the safety of the kitchen when operating appliances like stoves, microwave oven, and blender. Consider having supervision and share cooking responsibilities until no longer able to participate in those. Accidents with firearms and other hazards in the house should be identified and addressed as well.  DRIVING: Regarding driving, in patients with progressive memory problems, driving will be impaired. We advise to have someone else do the driving if trouble finding directions or if minor accidents are reported. Independent driving assessment is available to determine safety of driving.  ABILITY TO BE LEFT ALONE: If patient is unable to contact 911 operator, consider using LifeLine, or when the need is there, arrange for someone to stay with patients. Smoking is a fire hazard, consider supervision or cessation. Risk of wandering should be assessed by caregiver and if detected at any point, supervision and safe proof recommendations should be instituted.  MEDICATION SUPERVISION: Inability to self-administer medication needs to be constantly addressed. Implement a mechanism to  ensure safe administration of the medications.  RECOMMENDATIONS FOR ALL PATIENTS WITH MEMORY PROBLEMS: 1. Continue to exercise (Recommend 30 minutes of walking everyday, or 3 hours every week) 2. Increase social interactions - continue going to [place] and enjoy social gatherings with friends and family 3. Eat healthy, avoid fried foods and eat more fruits and vegetables 4. Maintain adequate blood pressure, blood sugar, and blood cholesterol level. Reducing the risk of stroke and cardiovascular disease also helps promoting better memory. 5. Avoid stressful situations. Live a simple life and avoid aggravations. Organize your time and prepare for the next day in anticipation. 6. Sleep well, avoid any interruptions of sleep and avoid any distractions in the bedroom that may interfere with adequate sleep quality 7. Avoid sugar, avoid sweets as there is a strong link between excessive sugar intake, diabetes, and cognitive impairment We discussed the Mediterranean diet, which has been shown to help patients reduce the risk of progressive memory disorders and reduces cardiovascular risk. This includes eating fish, eat fruits and green leafy vegetables, nuts like almonds and hazelnuts, walnuts, and also use olive oil. Avoid fast foods and fried foods as much as possible. Avoid sweets and sugar as sugar use has been linked to worsening of memory function.  There is always a concern of gradual progression of memory problems. If this is the case, then we may need to adjust level of care according to patient needs. Support, both to the patient and caregiver, should then be put into place.
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
## 2017-08-25 ENCOUNTER — Other Ambulatory Visit: Payer: Self-pay | Admitting: *Deleted

## 2017-08-25 ENCOUNTER — Telehealth: Payer: Self-pay | Admitting: *Deleted

## 2017-08-25 DIAGNOSIS — Z171 Estrogen receptor negative status [ER-]: Principal | ICD-10-CM

## 2017-08-25 DIAGNOSIS — C50411 Malignant neoplasm of upper-outer quadrant of right female breast: Secondary | ICD-10-CM

## 2017-08-25 NOTE — Telephone Encounter (Signed)
This RN returned VM to the patient per her inquiry regarding obtaining a mammogram scheduled for today at [facility] " or should I cancel this appointment and get it at your office when I see Dr Jana Hakim in August "  This RN noted pt is not due for mammogram until July 2019. This RN then called to [facility] and verified pt had an appointment and was informed pt did have an appointment but " the patient cancelled it on 08/14/2017 stating she was going to have it at her oncologist office at her next appointment "  This RN called pt - informed her she was not due for a mammogram until July and an appointment would be requested prior to her visit at the [facility] - where she usually obtains her mammos.  Shannon Grant agreed to above.

## 2017-10-09 ENCOUNTER — Telehealth: Payer: Self-pay | Admitting: Oncology

## 2017-10-09 NOTE — Telephone Encounter (Signed)
Called regarding change of dates per MD call day

## 2017-11-16 ENCOUNTER — Other Ambulatory Visit: Payer: Medicare Other

## 2017-11-16 ENCOUNTER — Ambulatory Visit: Payer: Medicare Other | Admitting: Oncology

## 2017-11-26 ENCOUNTER — Telehealth: Payer: Self-pay | Admitting: Oncology

## 2017-11-26 NOTE — Telephone Encounter (Signed)
Tried to reach regarding voicemail I did leave a message °

## 2017-12-01 ENCOUNTER — Inpatient Hospital Stay: Payer: Medicare Other | Attending: Oncology

## 2017-12-01 ENCOUNTER — Encounter: Payer: Self-pay | Admitting: Adult Health

## 2017-12-01 ENCOUNTER — Inpatient Hospital Stay (HOSPITAL_BASED_OUTPATIENT_CLINIC_OR_DEPARTMENT_OTHER): Payer: Medicare Other | Admitting: Adult Health

## 2017-12-01 ENCOUNTER — Other Ambulatory Visit: Payer: Medicare Other

## 2017-12-01 VITALS — BP 161/68 | HR 68 | Temp 98.3°F | Resp 16 | Wt 144.6 lb

## 2017-12-01 DIAGNOSIS — C50411 Malignant neoplasm of upper-outer quadrant of right female breast: Secondary | ICD-10-CM

## 2017-12-01 DIAGNOSIS — N6489 Other specified disorders of breast: Secondary | ICD-10-CM | POA: Diagnosis not present

## 2017-12-01 DIAGNOSIS — Z9221 Personal history of antineoplastic chemotherapy: Secondary | ICD-10-CM

## 2017-12-01 DIAGNOSIS — Z853 Personal history of malignant neoplasm of breast: Secondary | ICD-10-CM | POA: Insufficient documentation

## 2017-12-01 DIAGNOSIS — Z923 Personal history of irradiation: Secondary | ICD-10-CM | POA: Insufficient documentation

## 2017-12-01 DIAGNOSIS — Z171 Estrogen receptor negative status [ER-]: Principal | ICD-10-CM

## 2017-12-01 LAB — CBC WITH DIFFERENTIAL/PLATELET
Basophils Absolute: 0.1 10*3/uL (ref 0.0–0.1)
Basophils Relative: 1 %
EOS ABS: 0.1 10*3/uL (ref 0.0–0.5)
EOS PCT: 1 %
HCT: 41.5 % (ref 34.8–46.6)
Hemoglobin: 13.7 g/dL (ref 11.6–15.9)
Lymphocytes Relative: 15 %
Lymphs Abs: 0.9 10*3/uL (ref 0.9–3.3)
MCH: 31.4 pg (ref 25.1–34.0)
MCHC: 33 g/dL (ref 31.5–36.0)
MCV: 95 fL (ref 79.5–101.0)
MONO ABS: 0.7 10*3/uL (ref 0.1–0.9)
MONOS PCT: 11 %
NEUTROS PCT: 72 %
Neutro Abs: 4.4 10*3/uL (ref 1.5–6.5)
PLATELETS: 250 10*3/uL (ref 145–400)
RBC: 4.37 MIL/uL (ref 3.70–5.45)
RDW: 14.1 % (ref 11.2–14.5)
WBC: 6 10*3/uL (ref 3.9–10.3)

## 2017-12-01 LAB — COMPREHENSIVE METABOLIC PANEL
ALBUMIN: 4.4 g/dL (ref 3.5–5.0)
ALT: 15 U/L (ref 0–44)
ANION GAP: 10 (ref 5–15)
AST: 21 U/L (ref 15–41)
Alkaline Phosphatase: 47 U/L (ref 38–126)
BUN: 13 mg/dL (ref 8–23)
CO2: 28 mmol/L (ref 22–32)
Calcium: 10.3 mg/dL (ref 8.9–10.3)
Chloride: 104 mmol/L (ref 98–111)
Creatinine, Ser: 0.8 mg/dL (ref 0.44–1.00)
GFR calc non Af Amer: >60 mL/min (ref >60)
Glucose, Bld: 98 mg/dL (ref 70–99)
POTASSIUM: 4.1 mmol/L (ref 3.5–5.1)
SODIUM: 142 mmol/L (ref 135–145)
Total Bilirubin: 0.7 mg/dL (ref 0.3–1.2)
Total Protein: 6.8 g/dL (ref 6.5–8.1)
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
## 2017-12-01 NOTE — Progress Notes (Signed)
Nipple picture

## 2017-12-03 ENCOUNTER — Ambulatory Visit: Payer: Medicare Other | Admitting: Oncology

## 2017-12-08 ENCOUNTER — Ambulatory Visit
Admission: RE | Admit: 2017-12-08 | Discharge: 2017-12-08 | Disposition: A | Discharge status: Home / Self Care | Payer: Medicare Other | Source: Ambulatory Visit | Attending: Oncology | Admitting: Oncology

## 2017-12-08 DIAGNOSIS — C50411 Malignant neoplasm of upper-outer quadrant of right female breast: Secondary | ICD-10-CM

## 2017-12-08 DIAGNOSIS — Z171 Estrogen receptor negative status [ER-]: Principal | ICD-10-CM
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
## 2017-12-29 ENCOUNTER — Telehealth: Payer: Self-pay | Admitting: Oncology

## 2017-12-29 ENCOUNTER — Inpatient Hospital Stay: Payer: Medicare Other | Attending: Oncology

## 2017-12-29 ENCOUNTER — Inpatient Hospital Stay (HOSPITAL_BASED_OUTPATIENT_CLINIC_OR_DEPARTMENT_OTHER): Payer: Medicare Other | Admitting: Oncology

## 2017-12-29 VITALS — BP 175/77 | HR 67 | Temp 98.2°F | Resp 18 | Ht 64.0 in | Wt 144.4 lb

## 2017-12-29 DIAGNOSIS — Z79899 Other long term (current) drug therapy: Secondary | ICD-10-CM | POA: Diagnosis not present

## 2017-12-29 DIAGNOSIS — Z853 Personal history of malignant neoplasm of breast: Secondary | ICD-10-CM

## 2017-12-29 DIAGNOSIS — Z923 Personal history of irradiation: Secondary | ICD-10-CM | POA: Insufficient documentation

## 2017-12-29 DIAGNOSIS — Z171 Estrogen receptor negative status [ER-]: Secondary | ICD-10-CM

## 2017-12-29 DIAGNOSIS — Z87891 Personal history of nicotine dependence: Secondary | ICD-10-CM

## 2017-12-29 DIAGNOSIS — C50411 Malignant neoplasm of upper-outer quadrant of right female breast: Secondary | ICD-10-CM

## 2017-12-29 DIAGNOSIS — R21 Rash and other nonspecific skin eruption: Secondary | ICD-10-CM | POA: Insufficient documentation

## 2017-12-29 DIAGNOSIS — Z9221 Personal history of antineoplastic chemotherapy: Secondary | ICD-10-CM

## 2017-12-29 DIAGNOSIS — I10 Essential (primary) hypertension: Secondary | ICD-10-CM

## 2017-12-29 DIAGNOSIS — G301 Alzheimer's disease with late onset: Secondary | ICD-10-CM

## 2017-12-29 DIAGNOSIS — F0280 Dementia in other diseases classified elsewhere without behavioral disturbance: Secondary | ICD-10-CM

## 2017-12-29 LAB — CBC WITH DIFFERENTIAL/PLATELET
Basophils Absolute: 0.1 10*3/uL (ref 0.0–0.1)
Basophils Relative: 1 %
Eosinophils Absolute: 0.1 10*3/uL (ref 0.0–0.5)
Eosinophils Relative: 2 %
HEMATOCRIT: 42.1 % (ref 34.8–46.6)
HEMOGLOBIN: 13.7 g/dL (ref 11.6–15.9)
LYMPHS PCT: 15 %
Lymphs Abs: 0.8 10*3/uL — ABNORMAL LOW (ref 0.9–3.3)
MCH: 31.3 pg (ref 25.1–34.0)
MCHC: 32.7 g/dL (ref 31.5–36.0)
MCV: 95.8 fL (ref 79.5–101.0)
MONO ABS: 0.5 10*3/uL (ref 0.1–0.9)
MONOS PCT: 10 %
NEUTROS ABS: 3.8 10*3/uL (ref 1.5–6.5)
Neutrophils Relative %: 72 %
Platelets: 223 10*3/uL (ref 145–400)
RBC: 4.39 MIL/uL (ref 3.70–5.45)
RDW: 14 % (ref 11.2–14.5)
WBC: 5.3 10*3/uL (ref 3.9–10.3)

## 2017-12-29 LAB — COMPREHENSIVE METABOLIC PANEL
ALBUMIN: 4.5 g/dL (ref 3.5–5.0)
ALT: 16 U/L (ref 0–44)
AST: 22 U/L (ref 15–41)
Alkaline Phosphatase: 44 U/L (ref 38–126)
Anion gap: 10 (ref 5–15)
BUN: 14 mg/dL (ref 8–23)
CHLORIDE: 103 mmol/L (ref 98–111)
CO2: 28 mmol/L (ref 22–32)
Calcium: 10.4 mg/dL — ABNORMAL HIGH (ref 8.9–10.3)
Creatinine, Ser: 0.83 mg/dL (ref 0.44–1.00)
GFR calc Af Amer: >60 mL/min (ref >60)
GFR calc non Af Amer: >60 mL/min (ref >60)
GLUCOSE: 97 mg/dL (ref 70–99)
POTASSIUM: 5 mmol/L (ref 3.5–5.1)
SODIUM: 141 mmol/L (ref 135–145)
Total Bilirubin: 0.9 mg/dL (ref 0.3–1.2)
Total Protein: 6.8 g/dL (ref 6.5–8.1)

## 2017-12-29 NOTE — Telephone Encounter (Signed)
Per 10/1 no los

## 2018-03-29 ENCOUNTER — Ambulatory Visit: Payer: Medicare Other | Admitting: Neurology

## 2018-05-17 ENCOUNTER — Telehealth: Payer: Self-pay | Admitting: *Deleted

## 2018-05-17 NOTE — Telephone Encounter (Signed)
"  Shannon Grant [phone]).  Survivor patient of Dr.Magrinat's but one to weeks ago I noticed discomfort under right armpit and right  Breast.  Right breast feels lumpy and more lumpy than the left.  Red, flat rash to right nipple.  No itching pain or drainage from nipple or breat.  May be nothing but I'd like to make sure it's not cancer to put my anxiety off.  Do I start with Dr. Jana Hakim or who to diagnose?  Available any day time of day to see him if needed."

## 2018-05-19 ENCOUNTER — Telehealth: Payer: Self-pay

## 2018-05-19 NOTE — Telephone Encounter (Signed)
Pt called with concern to right breast (please see previous telephone encounter from triage).  Nurse reviewed with Dr. Jana Hakim, requested patient come by 05/21/2018 in afternoon and he will assess patient at that time.

## 2018-06-01 ENCOUNTER — Other Ambulatory Visit: Payer: Self-pay | Admitting: Family Medicine

## 2018-06-01 DIAGNOSIS — N63 Unspecified lump in unspecified breast: Secondary | ICD-10-CM

## 2018-06-04 ENCOUNTER — Ambulatory Visit
Admission: RE | Admit: 2018-06-04 | Discharge: 2018-06-04 | Disposition: A | Discharge status: Home / Self Care | Payer: Medicare Other | Source: Ambulatory Visit | Attending: Family Medicine | Admitting: Family Medicine

## 2018-06-04 ENCOUNTER — Other Ambulatory Visit: Payer: Self-pay | Admitting: Family Medicine

## 2018-06-04 DIAGNOSIS — N6489 Other specified disorders of breast: Secondary | ICD-10-CM

## 2018-06-04 DIAGNOSIS — N63 Unspecified lump in unspecified breast: Secondary | ICD-10-CM

## 2018-06-08 ENCOUNTER — Other Ambulatory Visit: Payer: Self-pay | Admitting: Family Medicine

## 2018-08-17 ENCOUNTER — Telehealth: Payer: Self-pay | Admitting: *Deleted

## 2018-08-17 NOTE — Telephone Encounter (Signed)
"  ZAINEB NOWACZYK [phone]).  Need to be seen.  Feeling discomfort and rash to right breast the past week ago.  Discomfort also in back at right shoulder area.  Rash is just a rash.  No itching, not raised, no bumps hard to describe but I see a diference.  I have not called PCP; I always call Dr. Jana Hakim.  Don't feel  comfortable waiting until September to see Dr. Jana Hakim."

## 2018-08-17 NOTE — Telephone Encounter (Signed)
This RN called pt - discussed concern - appointment made for follow up this Thursday 5/21.
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
## 2018-08-19 ENCOUNTER — Other Ambulatory Visit: Payer: Self-pay

## 2018-08-19 ENCOUNTER — Inpatient Hospital Stay: Payer: Medicare Other | Attending: Oncology | Admitting: Oncology

## 2018-08-19 VITALS — BP 179/94 | HR 71 | Temp 98.3°F | Resp 18 | Wt 145.9 lb

## 2018-08-19 DIAGNOSIS — C50411 Malignant neoplasm of upper-outer quadrant of right female breast: Secondary | ICD-10-CM

## 2018-08-19 DIAGNOSIS — Z809 Family history of malignant neoplasm, unspecified: Secondary | ICD-10-CM | POA: Diagnosis not present

## 2018-08-19 DIAGNOSIS — Z853 Personal history of malignant neoplasm of breast: Secondary | ICD-10-CM

## 2018-08-19 DIAGNOSIS — Z9221 Personal history of antineoplastic chemotherapy: Secondary | ICD-10-CM | POA: Diagnosis not present

## 2018-08-19 DIAGNOSIS — R21 Rash and other nonspecific skin eruption: Secondary | ICD-10-CM | POA: Insufficient documentation

## 2018-08-19 DIAGNOSIS — I10 Essential (primary) hypertension: Secondary | ICD-10-CM | POA: Diagnosis not present

## 2018-08-19 DIAGNOSIS — Z803 Family history of malignant neoplasm of breast: Secondary | ICD-10-CM | POA: Diagnosis not present

## 2018-08-19 DIAGNOSIS — Z923 Personal history of irradiation: Secondary | ICD-10-CM | POA: Diagnosis not present

## 2018-08-19 DIAGNOSIS — E039 Hypothyroidism, unspecified: Secondary | ICD-10-CM | POA: Insufficient documentation

## 2018-08-19 DIAGNOSIS — M5412 Radiculopathy, cervical region: Secondary | ICD-10-CM | POA: Insufficient documentation

## 2018-08-19 DIAGNOSIS — Z79899 Other long term (current) drug therapy: Secondary | ICD-10-CM | POA: Diagnosis not present

## 2018-08-19 MED ORDER — VITAMIN D 25 MCG (1000 UNIT) PO TABS: 1000.0000 [IU] | ORAL_TABLET | Freq: Every day | ORAL | 4 refills | Status: AC

## 2018-09-24 ENCOUNTER — Other Ambulatory Visit: Payer: Self-pay | Admitting: *Deleted

## 2018-12-02 ENCOUNTER — Other Ambulatory Visit: Payer: Self-pay | Admitting: Family Medicine

## 2018-12-07 ENCOUNTER — Other Ambulatory Visit: Payer: Medicare Other

## 2018-12-13 ENCOUNTER — Ambulatory Visit
Admission: RE | Admit: 2018-12-13 | Discharge: 2018-12-13 | Disposition: A | Discharge status: Home / Self Care | Payer: Medicare Other | Source: Ambulatory Visit | Attending: Family Medicine | Admitting: Family Medicine

## 2018-12-13 ENCOUNTER — Other Ambulatory Visit: Payer: Self-pay

## 2018-12-13 DIAGNOSIS — N6489 Other specified disorders of breast: Secondary | ICD-10-CM

## 2019-01-06 ENCOUNTER — Encounter: Payer: Self-pay | Admitting: Gynecology

## 2019-05-27 ENCOUNTER — Telehealth: Payer: Self-pay

## 2019-05-27 NOTE — Telephone Encounter (Signed)
Pt Daughter called no answer no voice mail set up

## 2019-06-02 ENCOUNTER — Ambulatory Visit: Payer: Medicare PPO | Attending: Internal Medicine

## 2019-06-02 DIAGNOSIS — Z23 Encounter for immunization: Secondary | ICD-10-CM | POA: Insufficient documentation

## 2019-06-02 NOTE — Progress Notes (Signed)
   Covid-19 Vaccination Clinic  Name:  Shannon Grant    MRN: [id number] DOB: Jan 07, 1946  06/02/2019  Shannon Grant was observed post Covid-19 immunization for 15 minutes without incident. She was provided with Vaccine Information Sheet and instruction to access the V-Safe system.   Shannon Grant was instructed to call 911 with any severe reactions post vaccine: Marland Kitchen Difficulty breathing  . Swelling of face and throat  . A fast heartbeat  . A bad rash all over body  . Dizziness and weakness   Immunizations Administered    Name Date Dose VIS Date Route   Pfizer COVID-19 Vaccine 06/02/2019  1:13 PM 0.3 mL 03/11/2019 Intramuscular   Manufacturer: [place]   Lot: [id number]   [place]: [id number]

## 2019-06-08 ENCOUNTER — Telehealth: Payer: Self-pay

## 2019-06-08 NOTE — Telephone Encounter (Signed)
Tried to call pt daughter back X 2 with no answer to inform her that Dr Delice Lesch is  very sorry but she cannot write that letter without evaluating her first. Her last visit with me was in May 2019 and at that time she was doing a lot of things independently, so she do not have any documentation in our system to be able to fill this out for her. She can also ask her PCP if they can do it in the meantime, otherwise we can put her on a cancellation list if she needs it earlier than her April 1 appt

## 2019-06-29 ENCOUNTER — Ambulatory Visit: Payer: Medicare PPO | Attending: Internal Medicine

## 2019-06-29 ENCOUNTER — Telehealth: Payer: Self-pay | Admitting: Neurology

## 2019-06-29 DIAGNOSIS — Z23 Encounter for immunization: Secondary | ICD-10-CM

## 2019-06-29 NOTE — Telephone Encounter (Signed)
Telephone call to patients daughter, Trevor Iha we have received the letter. Will inform the provider that this has to filled out during the visit 06/30/19.

## 2019-06-29 NOTE — Telephone Encounter (Signed)
Patient daughter wants to make sure got the letter she faxed over a couple of weeks ago please call  It is about POA

## 2019-06-29 NOTE — Progress Notes (Signed)
   Covid-19 Vaccination Clinic  Name:  Shannon Grant    MRN: [id number] DOB: 1945/11/08  06/29/2019  Shannon Grant was observed post Covid-19 immunization for 15 minutes without incident. She was provided with Vaccine Information Sheet and instruction to access the V-Safe system.   Shannon Grant was instructed to call 911 with any severe reactions post vaccine: Marland Kitchen Difficulty breathing  . Swelling of face and throat  . A fast heartbeat  . A bad rash all over body  . Dizziness and weakness   Immunizations Administered    Name Date Dose VIS Date Route   Pfizer COVID-19 Vaccine 06/29/2019 11:52 AM 0.3 mL 03/11/2019 Intramuscular   Manufacturer: [place]   Lot: [id number]   [place]: [id number]

## 2019-06-30 ENCOUNTER — Encounter: Payer: Self-pay | Admitting: Neurology

## 2019-06-30 ENCOUNTER — Telehealth (INDEPENDENT_AMBULATORY_CARE_PROVIDER_SITE_OTHER): Payer: Medicare PPO | Admitting: Neurology

## 2019-06-30 ENCOUNTER — Other Ambulatory Visit: Payer: Self-pay

## 2019-06-30 DIAGNOSIS — F0280 Dementia in other diseases classified elsewhere without behavioral disturbance: Secondary | ICD-10-CM

## 2019-06-30 DIAGNOSIS — G301 Alzheimer's disease with late onset: Secondary | ICD-10-CM | POA: Diagnosis not present

## 2019-06-30 MED ORDER — RIVASTIGMINE TARTRATE 1.5 MG PO CAPS: 1.5000 mg | ORAL_CAPSULE | Freq: Two times a day (BID) | ORAL | 11 refills | Status: DC
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
## 2019-07-05 ENCOUNTER — Encounter: Payer: Self-pay | Admitting: Neurology

## 2019-08-19 ENCOUNTER — Other Ambulatory Visit: Payer: Self-pay | Admitting: *Deleted

## 2019-08-19 DIAGNOSIS — Z171 Estrogen receptor negative status [ER-]: Secondary | ICD-10-CM

## 2019-08-22 ENCOUNTER — Inpatient Hospital Stay: Payer: Medicare PPO | Attending: Oncology | Admitting: Oncology

## 2019-08-22 ENCOUNTER — Inpatient Hospital Stay: Payer: Medicare PPO

## 2019-10-10 NOTE — Progress Notes (Signed)
error 

## 2019-12-12 DIAGNOSIS — N390 Urinary tract infection, site not specified: Secondary | ICD-10-CM | POA: Diagnosis not present

## 2019-12-25 ENCOUNTER — Other Ambulatory Visit: Payer: Self-pay | Admitting: Neurology

## 2019-12-30 DIAGNOSIS — G309 Alzheimer's disease, unspecified: Secondary | ICD-10-CM | POA: Diagnosis not present

## 2020-01-13 DIAGNOSIS — Z0000 Encounter for general adult medical examination without abnormal findings: Secondary | ICD-10-CM | POA: Diagnosis not present

## 2020-01-13 DIAGNOSIS — E039 Hypothyroidism, unspecified: Secondary | ICD-10-CM | POA: Diagnosis not present

## 2020-01-13 DIAGNOSIS — E782 Mixed hyperlipidemia: Secondary | ICD-10-CM | POA: Diagnosis not present

## 2020-01-13 DIAGNOSIS — I10 Essential (primary) hypertension: Secondary | ICD-10-CM | POA: Diagnosis not present

## 2020-01-13 DIAGNOSIS — Z853 Personal history of malignant neoplasm of breast: Secondary | ICD-10-CM | POA: Diagnosis not present

## 2020-01-13 DIAGNOSIS — Z23 Encounter for immunization: Secondary | ICD-10-CM | POA: Diagnosis not present

## 2020-01-30 ENCOUNTER — Ambulatory Visit: Payer: Medicare PPO | Admitting: Neurology

## 2020-03-29 ENCOUNTER — Other Ambulatory Visit: Payer: Self-pay | Admitting: Neurology

## 2020-05-11 ENCOUNTER — Encounter: Payer: Self-pay | Admitting: Gastroenterology

## 2020-05-16 DIAGNOSIS — H2513 Age-related nuclear cataract, bilateral: Secondary | ICD-10-CM | POA: Diagnosis not present

## 2020-06-07 DIAGNOSIS — H524 Presbyopia: Secondary | ICD-10-CM | POA: Diagnosis not present

## 2020-06-07 DIAGNOSIS — H25813 Combined forms of age-related cataract, bilateral: Secondary | ICD-10-CM | POA: Diagnosis not present

## 2020-06-07 DIAGNOSIS — H5203 Hypermetropia, bilateral: Secondary | ICD-10-CM | POA: Diagnosis not present

## 2020-06-07 DIAGNOSIS — H52223 Regular astigmatism, bilateral: Secondary | ICD-10-CM | POA: Diagnosis not present

## 2020-06-21 ENCOUNTER — Telehealth: Payer: Self-pay | Admitting: Gastroenterology

## 2020-06-21 NOTE — Telephone Encounter (Signed)
Patient's daughter called requesting to speak with a nurse regarding the patient receiving a recall letter and having Dementia.

## 2020-06-22 NOTE — Telephone Encounter (Signed)
Given her concerns please schedule an office appt to further evaluate the patient and to discuss colonoscopy. It might not be appropriate to proceed with colonoscopy.

## 2020-06-22 NOTE — Telephone Encounter (Signed)
Pt scheduled to see Dr. Fuller Plan 08/01/20 at 2:30pm. Daughter aware of appt.

## 2020-06-22 NOTE — Telephone Encounter (Signed)
Ms. Elbe daughter called and states pt got a recall letter for colon. She is concerned and not sure what to do because her mother now has alzheimer's and will not understand why she needs to do the prep and why she is having the stools assoc with the prep. Reports she cannot help her with the prep. She thought about just skipping the colon but then she saw where her mom had some polyps with the last colon. She would like to know what Dr. Fuller Plan recommends.
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
## 2020-07-30 ENCOUNTER — Other Ambulatory Visit: Payer: Self-pay

## 2020-07-30 ENCOUNTER — Ambulatory Visit: Payer: Medicare PPO | Admitting: Physician Assistant

## 2020-07-30 ENCOUNTER — Encounter: Payer: Self-pay | Admitting: Physician Assistant

## 2020-07-30 VITALS — BP 149/79 | HR 81 | Ht 64.0 in | Wt 128.2 lb

## 2020-07-30 DIAGNOSIS — R2689 Other abnormalities of gait and mobility: Secondary | ICD-10-CM | POA: Diagnosis not present

## 2020-07-30 DIAGNOSIS — R413 Other amnesia: Secondary | ICD-10-CM

## 2020-07-30 MED ORDER — RIVASTIGMINE TARTRATE 1.5 MG PO CAPS: 1.5000 mg | ORAL_CAPSULE | Freq: Two times a day (BID) | ORAL | 5 refills | Status: DC

## 2020-07-30 NOTE — Patient Instructions (Addendum)
It was a pleasure to see you today at our office.   Recommendations:  Meds: Follow up in 6  months PT at home Start Rivastigmine 1.5 mg twice a day    RECOMMENDATIONS FOR ALL PATIENTS WITH MEMORY PROBLEMS: 1. Continue to exercise (Recommend 30 minutes of walking everyday, or 3 hours every week) 2. Increase social interactions - continue going to [place] and enjoy social gatherings with friends and family 3. Eat healthy, avoid fried foods and eat more fruits and vegetables 4. Maintain adequate blood pressure, blood sugar, and blood cholesterol level. Reducing the risk of stroke and cardiovascular disease also helps promoting better memory. 5. Avoid stressful situations. Live a simple life and avoid aggravations. Organize your time and prepare for the next day in anticipation. 6. Sleep well, avoid any interruptions of sleep and avoid any distractions in the bedroom that may interfere with adequate sleep quality 7. Avoid sugar, avoid sweets as there is a strong link between excessive sugar intake, diabetes, and cognitive impairment We discussed the Mediterranean diet, which has been shown to help patients reduce the risk of progressive memory disorders and reduces cardiovascular risk. This includes eating fish, eat fruits and green leafy vegetables, nuts like almonds and hazelnuts, walnuts, and also use olive oil. Avoid fast foods and fried foods as much as possible. Avoid sweets and sugar as sugar use has been linked to worsening of memory function.  There is always a concern of gradual progression of memory problems. If this is the case, then we may need to adjust level of care according to patient needs. Support, both to the patient and caregiver, should then be put into place.        FALL PRECAUTIONS: Be cautious when walking. Scan the area for obstacles that may increase the risk of trips and falls. When getting up in the mornings, sit up at the edge of the bed for a few minutes before  getting out of bed. Consider elevating the bed at the head end to avoid drop of blood pressure when getting up. Walk always in a well-lit room (use night lights in the walls). Avoid area rugs or power cords from appliances in the middle of the walkways. Use a walker or a cane if necessary and consider physical therapy for balance exercise. Get your eyesight checked regularly.   HOME SAFETY: Consider the safety of the kitchen when operating appliances like stoves, microwave oven, and blender. Consider having supervision and share cooking responsibilities until no longer able to participate in those. Accidents with firearms and other hazards in the house should be identified and addressed as well.   ABILITY TO BE LEFT ALONE: If patient is unable to contact 911 operator, consider using LifeLine, or when the need is there, arrange for someone to stay with patients. Smoking is a fire hazard, consider supervision or cessation. Risk of wandering should be assessed by caregiver and if detected at any point, supervision and safe proof recommendations should be instituted.  MEDICATION SUPERVISION: Inability to self-administer medication needs to be constantly addressed. Implement a mechanism to ensure safe administration of the medications.      Mediterranean Diet A Mediterranean diet refers to food and lifestyle choices that are based on the traditions of countries located on the [employer]. This way of eating has been shown to help prevent certain conditions and improve outcomes for people who have chronic diseases, like kidney disease and heart disease. What are tips for following this plan? Lifestyle   Shannon Grant  and eat meals together with your family, when possible.  Drink enough fluid to keep your urine clear or pale yellow.  Be physically active every day. This includes:  Aerobic exercise like running or swimming.  Leisure activities like gardening, walking, or housework.  Get 7-8 hours  of sleep each night.  If recommended by your health care provider, drink red wine in moderation. This means 1 glass a day for nonpregnant women and 2 glasses a day for men. A glass of wine equals 5 oz (150 mL). Reading food labels   Check the serving size of packaged foods. For foods such as rice and pasta, the serving size refers to the amount of cooked product, not dry.  Check the total fat in packaged foods. Avoid foods that have saturated fat or trans fats.  Check the ingredients list for added sugars, such as corn syrup. Shopping   At the grocery store, buy most of your food from the areas near the walls of the store. This includes:  Fresh fruits and vegetables (produce).  Grains, beans, nuts, and seeds. Some of these may be available in unpackaged forms or large amounts (in bulk).  Fresh seafood.  Poultry and eggs.  Low-fat dairy products.  Buy whole ingredients instead of prepackaged foods.  Buy fresh fruits and vegetables in-season from local farmers markets.  Buy frozen fruits and vegetables in resealable bags.  If you do not have access to quality fresh seafood, buy precooked frozen shrimp or canned fish, such as tuna, salmon, or sardines.  Buy small amounts of raw or cooked vegetables, salads, or olives from the deli or salad bar at your store.  Stock your pantry so you always have certain foods on hand, such as olive oil, canned tuna, canned tomatoes, rice, pasta, and beans. Cooking   Cook foods with extra-virgin olive oil instead of using butter or other vegetable oils.  Have meat as a side dish, and have vegetables or grains as your main dish. This means having meat in small portions or adding small amounts of meat to foods like pasta or stew.  Use beans or vegetables instead of meat in common dishes like chili or lasagna.  Experiment with different cooking methods. Try roasting or broiling vegetables instead of steaming or sauteing them.  Add frozen  vegetables to soups, stews, pasta, or rice.  Add nuts or seeds for added healthy fat at each meal. You can add these to yogurt, salads, or vegetable dishes.  Marinate fish or vegetables using olive oil, lemon juice, garlic, and fresh herbs. Meal planning   Plan to eat 1 vegetarian meal one day each week. Try to work up to 2 vegetarian meals, if possible.  Eat seafood 2 or more times a week.  Have healthy snacks readily available, such as:  Vegetable sticks with hummus.  Greek yogurt.  Fruit and nut trail mix.  Eat balanced meals throughout the week. This includes:  Fruit: 2-3 servings a day  Vegetables: 4-5 servings a day  Low-fat dairy: 2 servings a day  Fish, poultry, or lean meat: 1 serving a day  Beans and legumes: 2 or more servings a week  Nuts and seeds: 1-2 servings a day  Whole grains: 6-8 servings a day  Extra-virgin olive oil: 3-4 servings a day  Limit red meat and sweets to only a few servings a month What are my food choices?  Mediterranean diet  Recommended  Grains: Whole-grain pasta. Brown rice. Bulgar wheat. Polenta. Couscous. Whole-wheat bread.  [place].  Vegetables: Artichokes. Beets. Broccoli. Cabbage. Carrots. Eggplant. Green beans. Chard. Kale. Spinach. Onions. Leeks. Peas. Squash. Tomatoes. Peppers. Radishes.  Fruits: Apples. Apricots. Avocado. Berries. Bananas. Cherries. Dates. Figs. Grapes. Lemons. Melon. Oranges. Peaches. Plums. Pomegranate.  Meats and other protein foods: Beans. Almonds. Sunflower seeds. Pine nuts. Peanuts. [place]. Salmon. Scallops. Shrimp. [place]. Tilapia. Clams. Oysters. Eggs.  Dairy: Low-fat milk. Cheese. Greek yogurt.  Beverages: Water. Red wine. Herbal tea.  Fats and oils: Extra virgin olive oil. Avocado oil. Grape seed oil.  Sweets and desserts: [place] yogurt with honey. Baked apples. Poached pears. Trail mix.  Seasoning and other foods: Basil. Cilantro. Coriander. Cumin. Mint. Parsley. Sage. Rosemary.  Tarragon. Garlic. Oregano. Thyme. Pepper. Balsalmic vinegar. Tahini. Hummus. Tomato sauce. Olives. Mushrooms.  Limit these  Grains: Prepackaged pasta or rice dishes. Prepackaged cereal with added sugar.  Vegetables: Deep fried potatoes (french fries).  Fruits: Fruit canned in syrup.  Meats and other protein foods: Beef. Pork. Lamb. Poultry with skin. Hot dogs. Berniece Salines.  Dairy: Ice cream. Sour cream. Whole milk.  Beverages: Juice. Sugar-sweetened soft drinks. Beer. Liquor and spirits.  Fats and oils: Butter. Canola oil. Vegetable oil. Beef fat (tallow). Lard.  Sweets and desserts: Cookies. Cakes. Pies. Candy.  Seasoning and other foods: Mayonnaise. Premade sauces and marinades.  The items listed may not be a complete list. Talk with your dietitian about what dietary choices are right for you. Summary  The Mediterranean diet includes both food and lifestyle choices.  Eat a variety of fresh fruits and vegetables, beans, nuts, seeds, and whole grains.  Limit the amount of red meat and sweets that you eat.  Talk with your health care provider about whether it is safe for you to drink red wine in moderation. This means 1 glass a day for nonpregnant women and 2 glasses a day for men. A glass of wine equals 5 oz (150 mL). This information is not intended to replace advice given to you by your health care provider. Make sure you discuss any questions you have with your health care provider. Document Released: 11/08/2015 Document Revised: 12/11/2015 Document Reviewed: 11/08/2015 Elsevier Interactive Patient Education  2017 [employer].

## 2020-08-01 ENCOUNTER — Ambulatory Visit: Payer: Medicare PPO | Admitting: Gastroenterology

## 2020-08-03 ENCOUNTER — Ambulatory Visit: Payer: Medicare Other | Admitting: Podiatry

## 2020-08-10 ENCOUNTER — Ambulatory Visit: Payer: Medicare Other | Admitting: Podiatry

## 2020-09-28 ENCOUNTER — Ambulatory Visit: Payer: Medicare PPO | Admitting: Neurology

## 2020-10-15 DIAGNOSIS — N3000 Acute cystitis without hematuria: Secondary | ICD-10-CM | POA: Diagnosis not present

## 2020-10-18 ENCOUNTER — Telehealth: Payer: Self-pay | Admitting: Physician Assistant

## 2020-10-18 ENCOUNTER — Other Ambulatory Visit: Payer: Self-pay

## 2020-10-18 DIAGNOSIS — F0280 Dementia in other diseases classified elsewhere without behavioral disturbance: Secondary | ICD-10-CM

## 2020-10-18 DIAGNOSIS — R2689 Other abnormalities of gait and mobility: Secondary | ICD-10-CM

## 2020-10-18 DIAGNOSIS — R413 Other amnesia: Secondary | ICD-10-CM

## 2020-10-18 DIAGNOSIS — G301 Alzheimer's disease with late onset: Secondary | ICD-10-CM

## 2020-10-18 NOTE — Telephone Encounter (Signed)
Spoke with pt daughter informed her that referral placed to [facility] home health

## 2020-10-18 NOTE — Telephone Encounter (Signed)
Daughter stated she never recvd information regarding her mothers physical therapy. Could someone call her back

## 2020-10-20 DIAGNOSIS — K121 Other forms of stomatitis: Secondary | ICD-10-CM | POA: Diagnosis not present

## 2020-10-20 DIAGNOSIS — K1230 Oral mucositis (ulcerative), unspecified: Secondary | ICD-10-CM | POA: Diagnosis not present

## 2020-10-26 DIAGNOSIS — Z86011 Personal history of benign neoplasm of the brain: Secondary | ICD-10-CM | POA: Diagnosis not present

## 2020-10-26 DIAGNOSIS — Z9181 History of falling: Secondary | ICD-10-CM | POA: Diagnosis not present

## 2020-10-26 DIAGNOSIS — E785 Hyperlipidemia, unspecified: Secondary | ICD-10-CM | POA: Diagnosis not present

## 2020-10-26 DIAGNOSIS — I10 Essential (primary) hypertension: Secondary | ICD-10-CM | POA: Diagnosis not present

## 2020-10-26 DIAGNOSIS — F0280 Dementia in other diseases classified elsewhere without behavioral disturbance: Secondary | ICD-10-CM | POA: Diagnosis not present

## 2020-10-26 DIAGNOSIS — Z853 Personal history of malignant neoplasm of breast: Secondary | ICD-10-CM | POA: Diagnosis not present

## 2020-10-26 DIAGNOSIS — G301 Alzheimer's disease with late onset: Secondary | ICD-10-CM | POA: Diagnosis not present

## 2020-10-26 DIAGNOSIS — E039 Hypothyroidism, unspecified: Secondary | ICD-10-CM | POA: Diagnosis not present

## 2020-10-30 DIAGNOSIS — Z9181 History of falling: Secondary | ICD-10-CM | POA: Diagnosis not present

## 2020-10-30 DIAGNOSIS — Z853 Personal history of malignant neoplasm of breast: Secondary | ICD-10-CM | POA: Diagnosis not present

## 2020-10-30 DIAGNOSIS — Z86011 Personal history of benign neoplasm of the brain: Secondary | ICD-10-CM | POA: Diagnosis not present

## 2020-10-30 DIAGNOSIS — E039 Hypothyroidism, unspecified: Secondary | ICD-10-CM | POA: Diagnosis not present

## 2020-10-30 DIAGNOSIS — I10 Essential (primary) hypertension: Secondary | ICD-10-CM | POA: Diagnosis not present

## 2020-10-30 DIAGNOSIS — E785 Hyperlipidemia, unspecified: Secondary | ICD-10-CM | POA: Diagnosis not present

## 2020-10-30 DIAGNOSIS — G301 Alzheimer's disease with late onset: Secondary | ICD-10-CM | POA: Diagnosis not present

## 2020-10-30 DIAGNOSIS — F0280 Dementia in other diseases classified elsewhere without behavioral disturbance: Secondary | ICD-10-CM | POA: Diagnosis not present

## 2020-10-31 DIAGNOSIS — R42 Dizziness and giddiness: Secondary | ICD-10-CM | POA: Diagnosis not present

## 2020-10-31 DIAGNOSIS — I10 Essential (primary) hypertension: Secondary | ICD-10-CM | POA: Diagnosis not present

## 2020-10-31 DIAGNOSIS — Z9181 History of falling: Secondary | ICD-10-CM | POA: Diagnosis not present

## 2020-10-31 DIAGNOSIS — Z86011 Personal history of benign neoplasm of the brain: Secondary | ICD-10-CM | POA: Diagnosis not present

## 2020-10-31 DIAGNOSIS — Z853 Personal history of malignant neoplasm of breast: Secondary | ICD-10-CM | POA: Diagnosis not present

## 2020-10-31 DIAGNOSIS — R739 Hyperglycemia, unspecified: Secondary | ICD-10-CM | POA: Diagnosis not present

## 2020-10-31 DIAGNOSIS — G301 Alzheimer's disease with late onset: Secondary | ICD-10-CM | POA: Diagnosis not present

## 2020-10-31 DIAGNOSIS — E039 Hypothyroidism, unspecified: Secondary | ICD-10-CM | POA: Diagnosis not present

## 2020-10-31 DIAGNOSIS — F0280 Dementia in other diseases classified elsewhere without behavioral disturbance: Secondary | ICD-10-CM | POA: Diagnosis not present

## 2020-10-31 DIAGNOSIS — E785 Hyperlipidemia, unspecified: Secondary | ICD-10-CM | POA: Diagnosis not present

## 2020-11-06 ENCOUNTER — Telehealth: Payer: Self-pay

## 2020-11-06 NOTE — Telephone Encounter (Signed)
(  3:46 pm) SW scheduled a visit with patient/daughter for 11/21/20 @ 11 am. Patient also has private sitters through [facility].

## 2020-11-07 DIAGNOSIS — E039 Hypothyroidism, unspecified: Secondary | ICD-10-CM | POA: Diagnosis not present

## 2020-11-07 DIAGNOSIS — I10 Essential (primary) hypertension: Secondary | ICD-10-CM | POA: Diagnosis not present

## 2020-11-07 DIAGNOSIS — G309 Alzheimer's disease, unspecified: Secondary | ICD-10-CM | POA: Diagnosis not present

## 2020-11-07 DIAGNOSIS — R2681 Unsteadiness on feet: Secondary | ICD-10-CM | POA: Diagnosis not present

## 2020-11-08 DIAGNOSIS — E039 Hypothyroidism, unspecified: Secondary | ICD-10-CM | POA: Diagnosis not present

## 2020-11-08 DIAGNOSIS — Z853 Personal history of malignant neoplasm of breast: Secondary | ICD-10-CM | POA: Diagnosis not present

## 2020-11-08 DIAGNOSIS — Z9181 History of falling: Secondary | ICD-10-CM | POA: Diagnosis not present

## 2020-11-08 DIAGNOSIS — E785 Hyperlipidemia, unspecified: Secondary | ICD-10-CM | POA: Diagnosis not present

## 2020-11-08 DIAGNOSIS — I10 Essential (primary) hypertension: Secondary | ICD-10-CM | POA: Diagnosis not present

## 2020-11-08 DIAGNOSIS — F0280 Dementia in other diseases classified elsewhere without behavioral disturbance: Secondary | ICD-10-CM | POA: Diagnosis not present

## 2020-11-08 DIAGNOSIS — G301 Alzheimer's disease with late onset: Secondary | ICD-10-CM | POA: Diagnosis not present

## 2020-11-08 DIAGNOSIS — Z86011 Personal history of benign neoplasm of the brain: Secondary | ICD-10-CM | POA: Diagnosis not present

## 2020-11-12 ENCOUNTER — Telehealth: Payer: Self-pay

## 2020-11-12 DIAGNOSIS — I10 Essential (primary) hypertension: Secondary | ICD-10-CM | POA: Diagnosis not present

## 2020-11-12 DIAGNOSIS — Z86011 Personal history of benign neoplasm of the brain: Secondary | ICD-10-CM | POA: Diagnosis not present

## 2020-11-12 DIAGNOSIS — Z9181 History of falling: Secondary | ICD-10-CM | POA: Diagnosis not present

## 2020-11-12 DIAGNOSIS — G301 Alzheimer's disease with late onset: Secondary | ICD-10-CM | POA: Diagnosis not present

## 2020-11-12 DIAGNOSIS — F0280 Dementia in other diseases classified elsewhere without behavioral disturbance: Secondary | ICD-10-CM | POA: Diagnosis not present

## 2020-11-12 DIAGNOSIS — Z853 Personal history of malignant neoplasm of breast: Secondary | ICD-10-CM | POA: Diagnosis not present

## 2020-11-12 DIAGNOSIS — E785 Hyperlipidemia, unspecified: Secondary | ICD-10-CM | POA: Diagnosis not present

## 2020-11-12 DIAGNOSIS — E039 Hypothyroidism, unspecified: Secondary | ICD-10-CM | POA: Diagnosis not present

## 2020-11-12 NOTE — Telephone Encounter (Signed)
(  4:02 pm)  SW returned call to patient's daughter who was calling to reschedule visit from 8/24. New visit scheduled for 11/28/20 @ 1:30 pm.

## 2020-11-19 DIAGNOSIS — G301 Alzheimer's disease with late onset: Secondary | ICD-10-CM | POA: Diagnosis not present

## 2020-11-19 DIAGNOSIS — Z853 Personal history of malignant neoplasm of breast: Secondary | ICD-10-CM | POA: Diagnosis not present

## 2020-11-19 DIAGNOSIS — F0280 Dementia in other diseases classified elsewhere without behavioral disturbance: Secondary | ICD-10-CM | POA: Diagnosis not present

## 2020-11-19 DIAGNOSIS — I10 Essential (primary) hypertension: Secondary | ICD-10-CM | POA: Diagnosis not present

## 2020-11-19 DIAGNOSIS — E785 Hyperlipidemia, unspecified: Secondary | ICD-10-CM | POA: Diagnosis not present

## 2020-11-19 DIAGNOSIS — Z86011 Personal history of benign neoplasm of the brain: Secondary | ICD-10-CM | POA: Diagnosis not present

## 2020-11-19 DIAGNOSIS — E039 Hypothyroidism, unspecified: Secondary | ICD-10-CM | POA: Diagnosis not present

## 2020-11-19 DIAGNOSIS — Z9181 History of falling: Secondary | ICD-10-CM | POA: Diagnosis not present

## 2020-11-21 ENCOUNTER — Other Ambulatory Visit: Payer: Self-pay | Admitting: *Deleted

## 2020-11-21 ENCOUNTER — Other Ambulatory Visit: Payer: Self-pay

## 2020-11-27 DIAGNOSIS — G301 Alzheimer's disease with late onset: Secondary | ICD-10-CM | POA: Diagnosis not present

## 2020-11-27 DIAGNOSIS — E039 Hypothyroidism, unspecified: Secondary | ICD-10-CM | POA: Diagnosis not present

## 2020-11-27 DIAGNOSIS — I10 Essential (primary) hypertension: Secondary | ICD-10-CM | POA: Diagnosis not present

## 2020-11-27 DIAGNOSIS — F0280 Dementia in other diseases classified elsewhere without behavioral disturbance: Secondary | ICD-10-CM | POA: Diagnosis not present

## 2020-11-27 DIAGNOSIS — Z86011 Personal history of benign neoplasm of the brain: Secondary | ICD-10-CM | POA: Diagnosis not present

## 2020-11-27 DIAGNOSIS — Z9181 History of falling: Secondary | ICD-10-CM | POA: Diagnosis not present

## 2020-11-27 DIAGNOSIS — Z853 Personal history of malignant neoplasm of breast: Secondary | ICD-10-CM | POA: Diagnosis not present

## 2020-11-27 DIAGNOSIS — E785 Hyperlipidemia, unspecified: Secondary | ICD-10-CM | POA: Diagnosis not present

## 2020-11-28 ENCOUNTER — Other Ambulatory Visit: Payer: Self-pay

## 2020-11-28 ENCOUNTER — Other Ambulatory Visit: Payer: Self-pay | Admitting: *Deleted

## 2020-11-28 VITALS — BP 148/88 | HR 67 | Temp 97.7°F | Resp 16

## 2020-11-28 DIAGNOSIS — Z515 Encounter for palliative care: Secondary | ICD-10-CM

## 2020-11-28 NOTE — Progress Notes (Signed)
[facility] RN NOTE  PATIENT NAME: Shannon Grant DOB: 1945-07-15 MRN: [id number]  PRIMARY CARE PROVIDER: Maurice Small, MD  RESPONSIBLE PARTY:  Acct ID - Guarantor Home Phone Work Phone Relationship Acct Type  [id number] Shannon Grant, [phone] [id number] Self P/F     [address] [place], [place] [address]   Covid-19 Pre-screening Negative  PLAN OF CARE and INTERVENTION:  ADVANCE CARE PLANNING/GOALS OF CARE: Goal is for patient to remain in her home. She is a Full Code. PATIENT/CAREGIVER EDUCATION: Explained palliative care services vs hospice, symptom management, safety DISEASE STATUS: Joint palliative care visit completed with LCSW, Shannon Grant. Met with patient and daughter, Shannon Grant, in the home. Patient has a diagnosis of Alzheimer's dementia and daughter has noticed worsening symptoms. Patient is alert and oriented to person/place, but has a very poor short term memory. She is able to speak in complete sentences. She is now requiring reminders to eat and to take her medications. Daughter reports patient has a history of breast cancer diagnosed in 2016. She did undergo chemotherapy and radiation treatments. She feels this has exacerbated her dementia. She does live alone, but daughter has hired caregivers through [facility] 3 days/week for 3 hours during lunch time. Daughter also checks on her regularly throughout the day. Otherwise she is independent with her ADLs. She is no longer using a stove, but is able to use the microwave. She does have times where she has subtle changes in her behavior and can become very "matter of fact." Her appetite is decreasing. She is eating 2 meals/day and snacks. No issues with dysphagia and takes her medications without difficulty. Denies any bowel or bladder issues. No edema. Daughter requested additional resources for home health services. List provided by Education officer, museum. Goals of care discussed. MOST form left in the home  for family to discuss and review. They will also discuss further regarding whether patient wants to be a DNR or not. She is agreeable to receive palliative care services. Consent signed. Will follow up with patient in 6 weeks and prn.  HISTORY OF PRESENT ILLNESS: This is a 75 yo female with a diagnosis of late onset Alzheimer's disease without behavioral disturbances. She has a history of breast cancer, hypertension, hyperlipidemia, neuropathy and osteoarthritis of hip. Palliative care services has been asked to follow patient for goals of care, symptom management and complex decision making.  CODE STATUS: Full code  ADVANCED DIRECTIVES: Y MOST FORM: no (Forms left in the home for family review/discussion) PPS: 60%   PHYSICAL EXAM:   VITALS: Today's Vitals   11/28/20 1334  BP: (!) 148/88  Pulse: 67  Resp: 16  Temp: 97.7 F (36.5 C)  TempSrc: Temporal  SpO2: 99%  PainSc: 0-No pain    LUNGS: clear to auscultation  CARDIAC: Cor RRR EXTREMITIES: No edema SKIN:  No skin issues   NEURO:  Alert and oriented to person/place, she is able to speak in complete sentences, poor short term memory, ambulatory   (Duration of visit and documentation 60 minutes)   Shannon Eastern, RN BSN

## 2020-11-29 NOTE — Progress Notes (Signed)
COMMUNITY PALLIATIVE CARE SW NOTE  PATIENT NAME: Shannon Grant DOB: Nov 30, 1945 MRN: [id number]  PRIMARY CARE PROVIDER: Maurice Small, MD  RESPONSIBLE PARTY:  Acct ID - Guarantor Home Phone Work Phone Relationship Acct Type  [id number] Shannon [phone] Self P/F     [address] [place], [place], [place] [address]     PLAN OF CARE and INTERVENTIONS:             GOALS OF CARE/ ADVANCE CARE PLANNING:  Goal is for patient to remain in her home, as independent as possible. Patient is a full code.  SOCIAL/EMOTIONAL/SPIRITUAL ASSESSMENT/ INTERVENTIONS:  SW and RN-M. Shannon Grant completed an initial visit with patient at her home. She was present with her daughter-Shannon Grant. Introduction to the team and palliative care care services . Patient's daughter provided verbal and written consent for ongoing palliative care services. Patient has Alzheimer's and her symptoms are progressing according to her daughter. SW observed this during the visit as patient repeated herself, asked the same questions several times and seemed to have difficulty completing sentences or finding words to complete a thought. She lives independently with daily contact from her family. She is requiring more reminders to take her medications and to eat, which her daughter has food ordered and delivered to patient daily. However, patient's appetite has decreased to 2 meals today and a snack. Her daughter is interested in sitters for patient to be with her a few hours a day. SW provided a list of caregiver agencies that Shannon Grant will follow-up on. SOCIAL HISTORY: Patient was born and raised in [place]. She is a retired Psychologist, prison and probation services. Patient is a breast cancer survivor, but following chemo she started to show signs of cognitive decline. Patient is currently a full code. Education was provided regarding the MOST form and a copy was left for further review. Patient and PCG are open to ongoing support and visits from the RN/SW  palliative care team.  PATIENT/CAREGIVER EDUCATION/ COPING:  Patient and PCG appear to be coping well.  PERSONAL EMERGENCY PLAN:  911 can be activated for emergency.  COMMUNITY RESOURCES COORDINATION/ HEALTH CARE NAVIGATION:  None currently. PCG is seeking private caregivers for patient-SW provided a list of resources. FINANCIAL/LEGAL CONCERNS/INTERVENTIONS:  None.     SOCIAL HX:  Social History   Tobacco Use   Smoking status: Former    Packs/day: 1.00    Years: 36.00    Pack years: 36.00    Types: Cigarettes    Quit date: 03/25/1985    Years since quitting: 35.7   Smokeless tobacco: Never  Substance Use Topics   Alcohol use: Yes    Alcohol/week: 0.0 standard drinks    Comment: glass of wine with dinner    CODE STATUS: Full Code ADVANCED DIRECTIVES: No MOST FORM COMPLETE:  Education provided HOSPICE EDUCATION PROVIDED: No  PPS: Patient is alert and oriented to self and situation, but has cognitive deficits due to Alzheimer's progression.   Duration of visit and documentation: 60 minutes.  [address] [place], [place]

## 2020-12-26 ENCOUNTER — Other Ambulatory Visit: Payer: Self-pay

## 2020-12-26 MED ORDER — RIVASTIGMINE TARTRATE 1.5 MG PO CAPS: 1.5000 mg | ORAL_CAPSULE | Freq: Two times a day (BID) | ORAL | 5 refills | Status: DC

## 2021-01-12 DIAGNOSIS — Z853 Personal history of malignant neoplasm of breast: Secondary | ICD-10-CM | POA: Diagnosis not present

## 2021-01-12 DIAGNOSIS — Z87891 Personal history of nicotine dependence: Secondary | ICD-10-CM | POA: Diagnosis not present

## 2021-01-12 DIAGNOSIS — R030 Elevated blood-pressure reading, without diagnosis of hypertension: Secondary | ICD-10-CM | POA: Diagnosis not present

## 2021-01-12 DIAGNOSIS — E039 Hypothyroidism, unspecified: Secondary | ICD-10-CM | POA: Diagnosis not present

## 2021-01-12 DIAGNOSIS — M1990 Unspecified osteoarthritis, unspecified site: Secondary | ICD-10-CM | POA: Diagnosis not present

## 2021-01-12 DIAGNOSIS — Z604 Social exclusion and rejection: Secondary | ICD-10-CM | POA: Diagnosis not present

## 2021-01-12 DIAGNOSIS — G309 Alzheimer's disease, unspecified: Secondary | ICD-10-CM | POA: Diagnosis not present

## 2021-01-12 DIAGNOSIS — L309 Dermatitis, unspecified: Secondary | ICD-10-CM | POA: Diagnosis not present

## 2021-01-12 DIAGNOSIS — Z791 Long term (current) use of non-steroidal anti-inflammatories (NSAID): Secondary | ICD-10-CM | POA: Diagnosis not present

## 2021-01-15 ENCOUNTER — Other Ambulatory Visit: Payer: Self-pay

## 2021-01-15 ENCOUNTER — Other Ambulatory Visit: Payer: Self-pay | Admitting: *Deleted

## 2021-01-15 VITALS — BP 159/87 | HR 61 | Temp 97.6°F | Resp 17

## 2021-01-15 DIAGNOSIS — Z515 Encounter for palliative care: Secondary | ICD-10-CM

## 2021-01-15 NOTE — Progress Notes (Signed)
[facility] PALLIATIVE C[facility] RN NOTE  PATIENT NAME: Shannon Grant DOB: [birth year] MRN: [id number]  PRIMARY CARE PROVIDER: Maurice Small, MD (Inactive)  RESPONSIBLE PARTY:  Acct ID - Guarantor Home Phone Work Phone Relationship Acct Type  [id number] [name], [name] Self P/F     [address] [place], [place] [address]   Covid-19 Pre-screening Negative  PLAN OF CARE and INTERVENTION:  ADVANCE CARE PLANNING/GOALS OF CARE: Goal is for patient to remain in her home as long as possible. PATIENT/CAREGIVER EDUCATION: N/A DISEASE STATUS: Joint visit made with LCSW, M. Lonon. Met with patient and her daughter in the home. She is pleasant and able to engage in conversation and make her needs known. She is repetitive at times. She has a private caregiver for 2 hours day/twice a week, Mondays and Tuesdays to make sure that she eats lunch on those days. Appetite is fair. She is not eating quite as much. Her daughter brings her coffee in the mornings, makes sure she eats lunch and patient will heat up something for dinner. She denies dysphagia. She requires constant reminders, but reminders are ineffective if they are not given in person. She remains independent with ADLs. She is continent of both bowel and bladder. Goal is for her to remain in her home. She is very high functioning. She lives alone. Daughter does not have any safety concerns at this time. Patient does not wander. Daughter did inquire about a camera system. Information provided on this. LCSW answered questions regarding how to start navigating Medicaid as requested. Patient has her yearly check up tomorrow. She will have lab work drawn and receive her flu vaccination. She is sleeping well at night and does not take naps during the day. She denies pain issues and no shortness of breath. Daughter feels that overall she is healthy. Follow up visit scheduled for 6 weeks.   HISTORY OF PRESENT ILLNESS:  This is a 75  yo female with a diagnosis of late onset Alzheimer's disease without behavioral disturbances. She has a history of breast cancer, hypertension, hyperlipidemia, neuropathy and osteoarthritis of hip. Palliative care services continue to follow patient for goals of care, symptom management and complex decision making.  CODE STATUS: Full code ADVANCED DIRECTIVES: Y MOST FORM: no PPS: 60%   PHYSICAL EXAM:   VITALS: Today's Vitals   01/15/21 1400  BP: (!) 159/87  Pulse: 61  Resp: 17  Temp: 97.6 F (36.4 C)  TempSrc: Temporal  SpO2: 100%  PainSc: 0-No pain    LUNGS: clear to auscultation  CARDIAC: Cor RRR EXTREMITIES: No edema SKIN:  No skin issues   NEURO:  Alert and oriented x 2 (person/place), forgetful, pleasant mood, ambulatory   (Duration of visit and documentation 45 minutes)   Daryl Eastern, RN BSN

## 2021-01-15 NOTE — Progress Notes (Signed)
COMMUNITY PALLIATIVE CARE SW NOTE  PATIENT NAME: Shannon Grant DOB: October 18, 1945 MRN: [id number]  PRIMARY CARE PROVIDER: Maurice Small, MD (Inactive)  RESPONSIBLE PARTY:  Acct ID - Guarantor Home Phone Work Phone Relationship Acct Type  [id number] [name] Self P/F     [place], [place], [place] [address]     PLAN OF CARE and INTERVENTIONS:             GOALS OF CARE/ ADVANCE CARE PLANNING:  Goal is for patient to remain in her home as independent and safe as possible. Patient is a Full Code. SOCIAL/EMOTIONAL/SPIRITUAL ASSESSMENT/ INTERVENTIONS:  SW and RN-M.Nadara Mustard completed a follow-up visit with patient at her home.She was present with her daughter-[name]. Patient was preset with her. Patient greeted the team at the door. She was cordial and appeared to be in a good mood. She and her daughter provided a status update on patient. Patient now have private caregivers 2 hours a day/2 days a week (Monday & Tuesday), and this has worked out well. Her appetite remains fair. Patient receives meals daily ordered by her daughter. She remains independent for ADL's. She has not issues with swallowing or taking her pills. She reported no issues with pain or breathing issues. She is still requiring more reminders to take medications and to eat which is evident of ongoing cognitive decline. Patient had a PT consult and no additional visits were recommended. Patient was given exercises to help her maintain her current status.  Patient lives alone with daily contact from her daughter and with hired sitters. The daughter is considering an in-home monitoring system for additional safety. She will also consider [employer] as a way for patient to have increased social stimulation. Patient is scheduled to see her PCP tomorrow for her yearly check-up and flu shot. SW provided additional resource to daughter for respite care and extended ongoing support to her. No other concerns  were noted. Patient and her daughter remain open to ongoing palliative care support.  PATIENT/CAREGIVER EDUCATION/ COPING:  Patient and daughter appear to be coping well.  PERSONAL EMERGENCY PLAN:  911 can be activated for emergencies. COMMUNITY RESOURCES COORDINATION/ HEALTH CARE NAVIGATION:  Patient has a private caregiver 2 days per week.  FINANCIAL/LEGAL CONCERNS/INTERVENTIONS:  None.     SOCIAL HX:  Social History   Tobacco Use   Smoking status: Former    Packs/day: 1.00    Years: 36.00    Pack years: 36.00    Types: Cigarettes    Quit date: 03/25/1985    Years since quitting: 35.8   Smokeless tobacco: Never  Substance Use Topics   Alcohol use: Yes    Alcohol/week: 0.0 standard drinks    Comment: glass of wine with dinner    CODE STATUS: Full Code ADVANCED DIRECTIVES: No MOST FORM COMPLETE: No HOSPICE EDUCATION PROVIDED: No  PPS: Patient is alert and oriented to self and situation, but has cognitive deficits due to Alzheimer's progression.    Duration of visit and documentation: 60 minutes.  [address] [place], [place]

## 2021-01-31 ENCOUNTER — Ambulatory Visit: Payer: Medicare PPO | Admitting: Physician Assistant

## 2021-01-31 ENCOUNTER — Encounter: Payer: Self-pay | Admitting: Physician Assistant

## 2021-01-31 ENCOUNTER — Other Ambulatory Visit: Payer: Self-pay

## 2021-01-31 ENCOUNTER — Telehealth: Payer: Self-pay | Admitting: Physician Assistant

## 2021-01-31 VITALS — HR 70 | Resp 18 | Ht 64.0 in | Wt 129.0 lb

## 2021-01-31 DIAGNOSIS — G301 Alzheimer's disease with late onset: Secondary | ICD-10-CM

## 2021-01-31 DIAGNOSIS — F0280 Dementia in other diseases classified elsewhere without behavioral disturbance: Secondary | ICD-10-CM

## 2021-01-31 MED ORDER — DONEPEZIL HCL 10 MG PO TABS: ORAL_TABLET | ORAL | 11 refills | Status: AC

## 2021-01-31 NOTE — Telephone Encounter (Signed)
[facility] called and said they need directions on whether to stop the Rx for rivastigmine before the patient starts her new medication prescribed today.
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
## 2021-01-31 NOTE — Patient Instructions (Signed)
It was a pleasure to see you today at our office.   Recommendations:  Meds: Follow up in  6 months Discontinue Rivastigmine and then call us when she has stopped it Starting We will start donepezil half tablet (5mg ) daily for 2  weeks.  If you are tolerating the medication, then after 2 weeks, we will increase the dose to a full tablet of 10 mg daily.      RECOMMENDATIONS FOR ALL PATIENTS WITH MEMORY PROBLEMS: 1. Continue to exercise (Recommend 30 minutes of walking everyday, or 3 hours every week) 2. Increase social interactions - continue going to [place] and enjoy social gatherings with friends and family 3. Eat healthy, avoid fried foods and eat more fruits and vegetables 4. Maintain adequate blood pressure, blood sugar, and blood cholesterol level. Reducing the risk of stroke and cardiovascular disease also helps promoting better memory. 5. Avoid stressful situations. Live a simple life and avoid aggravations. Organize your time and prepare for the next day in anticipation. 6. Sleep well, avoid any interruptions of sleep and avoid any distractions in the bedroom that may interfere with adequate sleep quality 7. Avoid sugar, avoid sweets as there is a strong link between excessive sugar intake, diabetes, and cognitive impairment We discussed the Mediterranean diet, which has been shown to help patients reduce the risk of progressive memory disorders and reduces cardiovascular risk. This includes eating fish, eat fruits and green leafy vegetables, nuts like almonds and hazelnuts, walnuts, and also use olive oil. Avoid fast foods and fried foods as much as possible. Avoid sweets and sugar as sugar use has been linked to worsening of memory function.  There is always a concern of gradual progression of memory problems. If this is the case, then we may need to adjust level of care according to patient needs. Support, both to the patient and caregiver, should then be put into place.    The  Alzheimer's Association is here all day, every day for people facing Alzheimer's disease through our free 24/7 Helpline: 8015572471. The Helpline provides reliable information and support to all those who need assistance, such as individuals living with memory loss, Alzheimer's or other dementia, caregivers, health care professionals and the public.  Our highly trained and knowledgeable staff can help you with: Understanding memory loss, dementia and Alzheimer's  Medications and other treatment options  General information about aging and brain health  Skills to provide quality care and to find the best care from professionals  Legal, financial and living-arrangement decisions Our Helpline also features: Confidential care consultation provided by master's level clinicians who can help with decision-making support, crisis assistance and education on issues families face every day  Help in a caller's preferred language using our translation service that features more than 200 languages and dialects  Referrals to local community programs, services and ongoing support     FALL PRECAUTIONS: Be cautious when walking. Scan the area for obstacles that may increase the risk of trips and falls. When getting up in the mornings, sit up at the edge of the bed for a few minutes before getting out of bed. Consider elevating the bed at the head end to avoid drop of blood pressure when getting up. Walk always in a well-lit room (use night lights in the walls). Avoid area rugs or power cords from appliances in the middle of the walkways. Use a walker or a cane if necessary and consider physical therapy for balance exercise. Get your eyesight checked regularly.  FINANCIAL  OVERSIGHT: Supervision, especially oversight when making financial decisions or transactions is also recommended.  HOME SAFETY: Consider the safety of the kitchen when operating appliances like stoves, microwave oven, and blender. Consider having  supervision and share cooking responsibilities until no longer able to participate in those. Accidents with firearms and other hazards in the house should be identified and addressed as well.   ABILITY TO BE LEFT ALONE: If patient is unable to contact 911 operator, consider using LifeLine, or when the need is there, arrange for someone to stay with patients. Smoking is a fire hazard, consider supervision or cessation. Risk of wandering should be assessed by caregiver and if detected at any point, supervision and safe proof recommendations should be instituted.  MEDICATION SUPERVISION: Inability to self-administer medication needs to be constantly addressed. Implement a mechanism to ensure safe administration of the medications.   DRIVING: Regarding driving, in patients with progressive memory problems, driving will be impaired. We advise to have someone else do the driving if trouble finding directions or if minor accidents are reported. Independent driving assessment is available to determine safety of driving.   If you are interested in the driving assessment, you can contact the following:  [employer] in [place]  [place] [phone] or [phone]      [place] refers to food and lifestyle choices that are based on the traditions of countries located on the [employer]. This way of eating has been shown to help prevent certain conditions and improve outcomes for people who have chronic diseases, like kidney disease and heart disease. What are tips for following this plan? Lifestyle  Cook and eat meals together with your family, when possible. Drink enough fluid to keep your urine clear or pale yellow. Be physically active every day. This includes: Aerobic exercise like running or swimming. Leisure activities like gardening,  walking, or housework. Get 7-8 hours of sleep each night. If recommended by your health care provider, drink red wine in moderation. This means 1 glass a day for nonpregnant women and 2 glasses a day for men. A glass of wine equals 5 oz (150 mL). Reading food labels  Check the serving size of packaged foods. For foods such as rice and pasta, the serving size refers to the amount of cooked product, not dry. Check the total fat in packaged foods. Avoid foods that have saturated fat or trans fats. Check the ingredients list for added sugars, such as corn syrup. Shopping  At the grocery store, buy most of your food from the areas near the walls of the store. This includes: Fresh fruits and vegetables (produce). Grains, beans, nuts, and seeds. Some of these may be available in unpackaged forms or large amounts (in bulk). Fresh seafood. Poultry and eggs. Low-fat dairy products. Buy whole ingredients instead of prepackaged foods. Buy fresh fruits and vegetables in-season from local farmers markets. Buy frozen fruits and vegetables in resealable bags. If you do not have access to quality fresh seafood, buy precooked frozen shrimp or canned fish, such as tuna, salmon, or sardines. Buy small amounts of raw or cooked vegetables, salads, or olives from the deli or salad bar at your store. Stock your pantry so you always have certain foods on hand, such as olive oil, canned tuna, canned tomatoes, rice, pasta, and beans. Cooking  Cook foods with extra-virgin olive oil instead of using butter or other vegetable oils. Have  meat as a side dish, and have vegetables or grains as your main dish. This means having meat in small portions or adding small amounts of meat to foods like pasta or stew. Use beans or vegetables instead of meat in common dishes like chili or lasagna. Experiment with different cooking methods. Try roasting or broiling vegetables instead of steaming or sauteing them. Add frozen vegetables  to soups, stews, pasta, or rice. Add nuts or seeds for added healthy fat at each meal. You can add these to yogurt, salads, or vegetable dishes. Marinate fish or vegetables using olive oil, lemon juice, garlic, and fresh herbs. Meal planning  Plan to eat 1 vegetarian meal one day each week. Try to work up to 2 vegetarian meals, if possible. Eat seafood 2 or more times a week. Have healthy snacks readily available, such as: Vegetable sticks with hummus. Greek yogurt. Fruit and nut trail mix. Eat balanced meals throughout the week. This includes: Fruit: 2-3 servings a day Vegetables: 4-5 servings a day Low-fat dairy: 2 servings a day Fish, poultry, or lean meat: 1 serving a day Beans and legumes: 2 or more servings a week Nuts and seeds: 1-2 servings a day Whole grains: 6-8 servings a day Extra-virgin olive oil: 3-4 servings a day Limit red meat and sweets to only a few servings a month What are my food choices? Mediterranean diet Recommended Grains: Whole-grain pasta. Brown rice. Bulgar wheat. Polenta. Couscous. Whole-wheat bread. [place]. Vegetables: Artichokes. Beets. Broccoli. Cabbage. Carrots. Eggplant. Green beans. Chard. Kale. Spinach. Onions. Leeks. Peas. Squash. Tomatoes. Peppers. Radishes. Fruits: Apples. Apricots. Avocado. Berries. Bananas. Cherries. Dates. Figs. Grapes. Lemons. Melon. Oranges. Peaches. Plums. Pomegranate. Meats and other protein foods: Beans. Almonds. Sunflower seeds. Pine nuts. Peanuts. [place]. Salmon. Scallops. Shrimp. [place]. Tilapia. Clams. Oysters. Eggs. Dairy: Low-fat milk. Cheese. Greek yogurt. Beverages: Water. Red wine. Herbal tea. Fats and oils: Extra virgin olive oil. Avocado oil. Grape seed oil. Sweets and desserts: [place] yogurt with honey. Baked apples. Poached pears. Trail mix. Seasoning and other foods: Basil. Cilantro. Coriander. Cumin. Mint. Parsley. Sage. Rosemary. Tarragon. Garlic. Oregano. Thyme. Pepper. Balsalmic vinegar. Tahini.  Hummus. Tomato sauce. Olives. Mushrooms. Limit these Grains: Prepackaged pasta or rice dishes. Prepackaged cereal with added sugar. Vegetables: Deep fried potatoes (french fries). Fruits: Fruit canned in syrup. Meats and other protein foods: Beef. Pork. Lamb. Poultry with skin. Hot dogs. Berniece Salines. Dairy: Ice cream. Sour cream. Whole milk. Beverages: Juice. Sugar-sweetened soft drinks. Beer. Liquor and spirits. Fats and oils: Butter. Canola oil. Vegetable oil. Beef fat (tallow). Lard. Sweets and desserts: Cookies. Cakes. Pies. Candy. Seasoning and other foods: Mayonnaise. Premade sauces and marinades. The items listed may not be a complete list. Talk with your dietitian about what dietary choices are right for you. Summary The Mediterranean diet includes both food and lifestyle choices. Eat a variety of fresh fruits and vegetables, beans, nuts, seeds, and whole grains. Limit the amount of red meat and sweets that you eat. Talk with your health care provider about whether it is safe for you to drink red wine in moderation. This means 1 glass a day for nonpregnant women and 2 glasses a day for men. A glass of wine equals 5 oz (150 mL). This information is not intended to replace advice given to you by your health care provider. Make sure you discuss any questions you have with your health care provider. Document Released: 11/08/2015 Document Revised: 12/11/2015 Document Reviewed: 11/08/2015 Elsevier Interactive Patient Education  2017 [employer].

## 2021-02-01 NOTE — Telephone Encounter (Signed)
Pharmacy notified of change in medication, thanked me for calling.

## 2021-03-06 ENCOUNTER — Other Ambulatory Visit: Payer: Self-pay

## 2021-03-06 ENCOUNTER — Other Ambulatory Visit: Payer: Self-pay | Admitting: *Deleted

## 2021-04-18 ENCOUNTER — Other Ambulatory Visit: Payer: Self-pay

## 2021-04-18 DIAGNOSIS — I10 Essential (primary) hypertension: Secondary | ICD-10-CM | POA: Diagnosis not present

## 2021-04-18 DIAGNOSIS — Z515 Encounter for palliative care: Secondary | ICD-10-CM

## 2021-04-18 DIAGNOSIS — G309 Alzheimer's disease, unspecified: Secondary | ICD-10-CM | POA: Diagnosis not present

## 2021-04-18 DIAGNOSIS — E039 Hypothyroidism, unspecified: Secondary | ICD-10-CM | POA: Diagnosis not present

## 2021-04-19 ENCOUNTER — Other Ambulatory Visit: Payer: Self-pay

## 2021-04-19 NOTE — Progress Notes (Signed)
COMMUNITY PALLIATIVE CARE SW NOTE  PATIENT NAME: TWANA WILEMAN DOB: [birth year] MRN: [id number]  PRIMARY CARE PROVIDER: Maurice Small, MD  RESPONSIBLE PARTY:  Acct ID - Guarantor Home Phone Work Phone Relationship Acct Type  [id number] VIDA, [phone] Self P/F     [address] [place], [place] [address]   Due to the COVID-19 crisis, this virtual check-in visit was done via telephone from my office and it was initiated and consent by this patient and or family.  SW completed a virtual check-in visit via telephonic with patient's daughter-[name]. Patient's daughter was out of the country, but would  like schedule a visit with patient when she Dealer) returns. SW to follow-up. Patient's daughter provided a status update on patient. Patient continues to have private caregivers 2x week. Patient's other daughter is currently visiting with patient now while her PCG/daughter is out of the country. Patient continues to receive ordered meals, however, her appetite is very poor, which is a concern for her daughter. Patient remains relatively independent for ADL's and ambulates independently.  Patient's daughter verbalized no other concerns. PCG remains open to ongoing palliative care visits and support.  [address] [place], [place]

## 2021-06-07 ENCOUNTER — Telehealth: Payer: Self-pay

## 2021-06-07 ENCOUNTER — Other Ambulatory Visit: Payer: Self-pay

## 2021-06-07 DIAGNOSIS — F0280 Dementia in other diseases classified elsewhere without behavioral disturbance: Secondary | ICD-10-CM

## 2021-06-07 DIAGNOSIS — R2689 Other abnormalities of gait and mobility: Secondary | ICD-10-CM

## 2021-06-07 NOTE — Telephone Encounter (Signed)
Printed off, will have  ?Sharene Butters PA-C sign Rx for hospital bed  ?

## 2021-06-07 NOTE — Telephone Encounter (Signed)
New message   Per daughter [name] calling asking for a prescription for bed to delivery to the home.   Mom fell this morning, daughter reach out to her PCP for a prescription for bed has not heard back

## 2021-06-10 ENCOUNTER — Inpatient Hospital Stay (HOSPITAL_COMMUNITY)
Admission: EM | Admit: 2021-06-10 | Discharge: 2021-06-12 | DRG: 281 | Disposition: A | Discharge status: Hospice | Attending: Family Medicine | Admitting: Family Medicine

## 2021-06-10 ENCOUNTER — Emergency Department (HOSPITAL_COMMUNITY)

## 2021-06-10 DIAGNOSIS — Z7189 Other specified counseling: Secondary | ICD-10-CM | POA: Diagnosis not present

## 2021-06-10 DIAGNOSIS — Z79890 Hormone replacement therapy (postmenopausal): Secondary | ICD-10-CM

## 2021-06-10 DIAGNOSIS — Z853 Personal history of malignant neoplasm of breast: Secondary | ICD-10-CM | POA: Diagnosis not present

## 2021-06-10 DIAGNOSIS — F0394 Unspecified dementia, unspecified severity, with anxiety: Secondary | ICD-10-CM | POA: Diagnosis present

## 2021-06-10 DIAGNOSIS — Z515 Encounter for palliative care: Secondary | ICD-10-CM

## 2021-06-10 DIAGNOSIS — Z79899 Other long term (current) drug therapy: Secondary | ICD-10-CM

## 2021-06-10 DIAGNOSIS — I110 Hypertensive heart disease with heart failure: Secondary | ICD-10-CM | POA: Diagnosis present

## 2021-06-10 DIAGNOSIS — Z66 Do not resuscitate: Secondary | ICD-10-CM | POA: Diagnosis not present

## 2021-06-10 DIAGNOSIS — Z96643 Presence of artificial hip joint, bilateral: Secondary | ICD-10-CM | POA: Diagnosis present

## 2021-06-10 DIAGNOSIS — F0390 Unspecified dementia without behavioral disturbance: Secondary | ICD-10-CM | POA: Diagnosis not present

## 2021-06-10 DIAGNOSIS — S022XXA Fracture of nasal bones, initial encounter for closed fracture: Secondary | ICD-10-CM | POA: Diagnosis not present

## 2021-06-10 DIAGNOSIS — F03911 Unspecified dementia, unspecified severity, with agitation: Secondary | ICD-10-CM | POA: Diagnosis present

## 2021-06-10 DIAGNOSIS — E039 Hypothyroidism, unspecified: Secondary | ICD-10-CM | POA: Diagnosis present

## 2021-06-10 DIAGNOSIS — I5042 Chronic combined systolic (congestive) and diastolic (congestive) heart failure: Secondary | ICD-10-CM | POA: Diagnosis present

## 2021-06-10 DIAGNOSIS — Z923 Personal history of irradiation: Secondary | ICD-10-CM

## 2021-06-10 DIAGNOSIS — I214 Non-ST elevation (NSTEMI) myocardial infarction: Secondary | ICD-10-CM | POA: Diagnosis present

## 2021-06-10 DIAGNOSIS — I513 Intracardiac thrombosis, not elsewhere classified: Secondary | ICD-10-CM | POA: Diagnosis present

## 2021-06-10 DIAGNOSIS — R079 Chest pain, unspecified: Secondary | ICD-10-CM | POA: Diagnosis not present

## 2021-06-10 DIAGNOSIS — Z8249 Family history of ischemic heart disease and other diseases of the circulatory system: Secondary | ICD-10-CM

## 2021-06-10 DIAGNOSIS — Z803 Family history of malignant neoplasm of breast: Secondary | ICD-10-CM | POA: Diagnosis not present

## 2021-06-10 DIAGNOSIS — R778 Other specified abnormalities of plasma proteins: Secondary | ICD-10-CM | POA: Diagnosis not present

## 2021-06-10 DIAGNOSIS — Z87891 Personal history of nicotine dependence: Secondary | ICD-10-CM

## 2021-06-10 DIAGNOSIS — Z20822 Contact with and (suspected) exposure to covid-19: Secondary | ICD-10-CM | POA: Diagnosis present

## 2021-06-10 DIAGNOSIS — M4126 Other idiopathic scoliosis, lumbar region: Secondary | ICD-10-CM | POA: Diagnosis not present

## 2021-06-10 DIAGNOSIS — I472 Ventricular tachycardia, unspecified: Secondary | ICD-10-CM | POA: Diagnosis not present

## 2021-06-10 DIAGNOSIS — I10 Essential (primary) hypertension: Secondary | ICD-10-CM | POA: Diagnosis present

## 2021-06-10 DIAGNOSIS — R0789 Other chest pain: Secondary | ICD-10-CM | POA: Diagnosis not present

## 2021-06-10 DIAGNOSIS — S199XXA Unspecified injury of neck, initial encounter: Secondary | ICD-10-CM | POA: Diagnosis not present

## 2021-06-10 DIAGNOSIS — E079 Disorder of thyroid, unspecified: Secondary | ICD-10-CM | POA: Diagnosis not present

## 2021-06-10 DIAGNOSIS — Z9221 Personal history of antineoplastic chemotherapy: Secondary | ICD-10-CM | POA: Diagnosis not present

## 2021-06-10 DIAGNOSIS — I509 Heart failure, unspecified: Secondary | ICD-10-CM | POA: Diagnosis not present

## 2021-06-10 DIAGNOSIS — M545 Low back pain, unspecified: Secondary | ICD-10-CM | POA: Diagnosis not present

## 2021-06-10 DIAGNOSIS — M2578 Osteophyte, vertebrae: Secondary | ICD-10-CM | POA: Diagnosis not present

## 2021-06-10 DIAGNOSIS — E7800 Pure hypercholesterolemia, unspecified: Secondary | ICD-10-CM | POA: Diagnosis present

## 2021-06-10 DIAGNOSIS — M5136 Other intervertebral disc degeneration, lumbar region: Secondary | ICD-10-CM | POA: Diagnosis not present

## 2021-06-10 DIAGNOSIS — M25552 Pain in left hip: Secondary | ICD-10-CM | POA: Diagnosis not present

## 2021-06-10 LAB — CBC WITH DIFFERENTIAL/PLATELET
Abs Immature Granulocytes: 0.04 10*3/uL (ref 0.00–0.07)
Basophils Absolute: 0.1 10*3/uL (ref 0.0–0.1)
Basophils Relative: 1 %
Eosinophils Absolute: 0.2 10*3/uL (ref 0.0–0.5)
Eosinophils Relative: 3 %
HCT: 49.4 % — ABNORMAL HIGH (ref 36.0–46.0)
Hemoglobin: 16.4 g/dL — ABNORMAL HIGH (ref 12.0–15.0)
Immature Granulocytes: 1 %
Lymphocytes Relative: 13 %
Lymphs Abs: 1 10*3/uL (ref 0.7–4.0)
MCH: 32.4 pg (ref 26.0–34.0)
MCHC: 33.2 g/dL (ref 30.0–36.0)
MCV: 97.6 fL (ref 80.0–100.0)
Monocytes Absolute: 0.7 10*3/uL (ref 0.1–1.0)
Monocytes Relative: 9 %
Neutro Abs: 5.6 10*3/uL (ref 1.7–7.7)
Neutrophils Relative %: 73 %
Platelets: 256 10*3/uL (ref 150–400)
RBC: 5.06 MIL/uL (ref 3.87–5.11)
RDW: 12.9 % (ref 11.5–15.5)
WBC: 7.6 10*3/uL (ref 4.0–10.5)
nRBC: 0 % (ref 0.0–0.2)

## 2021-06-10 LAB — COMPREHENSIVE METABOLIC PANEL
ALT: 27 U/L (ref 0–44)
AST: 41 U/L (ref 15–41)
Albumin: 3.6 g/dL (ref 3.5–5.0)
Alkaline Phosphatase: 51 U/L (ref 38–126)
Anion gap: 9 (ref 5–15)
BUN: 15 mg/dL (ref 8–23)
CO2: 28 mmol/L (ref 22–32)
Calcium: 10.2 mg/dL (ref 8.9–10.3)
Chloride: 100 mmol/L (ref 98–111)
Creatinine, Ser: 1 mg/dL (ref 0.44–1.00)
GFR, Estimated: 58 mL/min — ABNORMAL LOW (ref >60)
Glucose, Bld: 100 mg/dL — ABNORMAL HIGH (ref 70–99)
Potassium: 3.8 mmol/L (ref 3.5–5.1)
Sodium: 137 mmol/L (ref 135–145)
Total Bilirubin: 0.7 mg/dL (ref 0.3–1.2)
Total Protein: 6.4 g/dL — ABNORMAL LOW (ref 6.5–8.1)

## 2021-06-10 LAB — TROPONIN I (HIGH SENSITIVITY): Troponin I (High Sensitivity): 284 ng/L (ref <18)

## 2021-06-10 NOTE — ED Notes (Signed)
Mariann Laster from  [facility] called to give some information on the patient, states that she can be reached at [phone].  ?

## 2021-06-10 NOTE — ED Triage Notes (Signed)
EMS arrival. [facility] Friday. Cx pain all over. Today began complaining of chest pain. Dementia. Palliative care began yesterday. GCS of 14. ?

## 2021-06-11 ENCOUNTER — Emergency Department (HOSPITAL_COMMUNITY)

## 2021-06-11 ENCOUNTER — Other Ambulatory Visit: Payer: Self-pay

## 2021-06-11 ENCOUNTER — Encounter (HOSPITAL_COMMUNITY): Payer: Self-pay | Admitting: Internal Medicine

## 2021-06-11 ENCOUNTER — Inpatient Hospital Stay (HOSPITAL_COMMUNITY)

## 2021-06-11 DIAGNOSIS — I214 Non-ST elevation (NSTEMI) myocardial infarction: Secondary | ICD-10-CM

## 2021-06-11 DIAGNOSIS — I10 Essential (primary) hypertension: Secondary | ICD-10-CM

## 2021-06-11 DIAGNOSIS — Z66 Do not resuscitate: Secondary | ICD-10-CM | POA: Diagnosis not present

## 2021-06-11 DIAGNOSIS — Z20822 Contact with and (suspected) exposure to covid-19: Secondary | ICD-10-CM | POA: Diagnosis present

## 2021-06-11 DIAGNOSIS — Z79890 Hormone replacement therapy (postmenopausal): Secondary | ICD-10-CM | POA: Diagnosis not present

## 2021-06-11 DIAGNOSIS — E039 Hypothyroidism, unspecified: Secondary | ICD-10-CM | POA: Diagnosis present

## 2021-06-11 DIAGNOSIS — I513 Intracardiac thrombosis, not elsewhere classified: Secondary | ICD-10-CM | POA: Diagnosis present

## 2021-06-11 DIAGNOSIS — F03911 Unspecified dementia, unspecified severity, with agitation: Secondary | ICD-10-CM | POA: Diagnosis present

## 2021-06-11 DIAGNOSIS — F0390 Unspecified dementia without behavioral disturbance: Secondary | ICD-10-CM

## 2021-06-11 DIAGNOSIS — E7800 Pure hypercholesterolemia, unspecified: Secondary | ICD-10-CM | POA: Diagnosis present

## 2021-06-11 DIAGNOSIS — Z515 Encounter for palliative care: Secondary | ICD-10-CM

## 2021-06-11 DIAGNOSIS — Z79899 Other long term (current) drug therapy: Secondary | ICD-10-CM | POA: Diagnosis not present

## 2021-06-11 DIAGNOSIS — Z96643 Presence of artificial hip joint, bilateral: Secondary | ICD-10-CM | POA: Diagnosis present

## 2021-06-11 DIAGNOSIS — Z7189 Other specified counseling: Secondary | ICD-10-CM

## 2021-06-11 DIAGNOSIS — E079 Disorder of thyroid, unspecified: Secondary | ICD-10-CM | POA: Diagnosis not present

## 2021-06-11 DIAGNOSIS — I472 Ventricular tachycardia, unspecified: Secondary | ICD-10-CM | POA: Diagnosis not present

## 2021-06-11 DIAGNOSIS — Z853 Personal history of malignant neoplasm of breast: Secondary | ICD-10-CM | POA: Diagnosis not present

## 2021-06-11 DIAGNOSIS — I5042 Chronic combined systolic (congestive) and diastolic (congestive) heart failure: Secondary | ICD-10-CM | POA: Diagnosis present

## 2021-06-11 DIAGNOSIS — I509 Heart failure, unspecified: Secondary | ICD-10-CM | POA: Diagnosis not present

## 2021-06-11 DIAGNOSIS — Z9221 Personal history of antineoplastic chemotherapy: Secondary | ICD-10-CM | POA: Diagnosis not present

## 2021-06-11 DIAGNOSIS — Z87891 Personal history of nicotine dependence: Secondary | ICD-10-CM | POA: Diagnosis not present

## 2021-06-11 DIAGNOSIS — R778 Other specified abnormalities of plasma proteins: Secondary | ICD-10-CM

## 2021-06-11 DIAGNOSIS — Z8249 Family history of ischemic heart disease and other diseases of the circulatory system: Secondary | ICD-10-CM | POA: Diagnosis not present

## 2021-06-11 DIAGNOSIS — F0394 Unspecified dementia, unspecified severity, with anxiety: Secondary | ICD-10-CM | POA: Diagnosis present

## 2021-06-11 DIAGNOSIS — Z803 Family history of malignant neoplasm of breast: Secondary | ICD-10-CM | POA: Diagnosis not present

## 2021-06-11 DIAGNOSIS — Z923 Personal history of irradiation: Secondary | ICD-10-CM | POA: Diagnosis not present

## 2021-06-11 DIAGNOSIS — I110 Hypertensive heart disease with heart failure: Secondary | ICD-10-CM | POA: Diagnosis present

## 2021-06-11 LAB — ECHOCARDIOGRAM COMPLETE
AR max vel: 2.18 cm2
AV Area VTI: 1.97 cm2
AV Area mean vel: 2.05 cm2
AV Mean grad: 2 mmHg
AV Peak grad: 3.9 mmHg
Ao pk vel: 0.99 m/s
Area-P 1/2: 3.83 cm2
Height: 63 in
S' Lateral: 2.1 cm
Weight: 1904.77 oz

## 2021-06-11 LAB — COMPREHENSIVE METABOLIC PANEL
ALT: 22 U/L (ref 0–44)
AST: 34 U/L (ref 15–41)
Albumin: 3.2 g/dL — ABNORMAL LOW (ref 3.5–5.0)
Alkaline Phosphatase: 48 U/L (ref 38–126)
Anion gap: 8 (ref 5–15)
BUN: 16 mg/dL (ref 8–23)
CO2: 26 mmol/L (ref 22–32)
Calcium: 9.7 mg/dL (ref 8.9–10.3)
Chloride: 102 mmol/L (ref 98–111)
Creatinine, Ser: 0.97 mg/dL (ref 0.44–1.00)
GFR, Estimated: >60 mL/min (ref >60)
Glucose, Bld: 109 mg/dL — ABNORMAL HIGH (ref 70–99)
Potassium: 4.1 mmol/L (ref 3.5–5.1)
Sodium: 136 mmol/L (ref 135–145)
Total Bilirubin: 0.8 mg/dL (ref 0.3–1.2)
Total Protein: 5.5 g/dL — ABNORMAL LOW (ref 6.5–8.1)

## 2021-06-11 LAB — CBC WITH DIFFERENTIAL/PLATELET
Abs Immature Granulocytes: 0.05 10*3/uL (ref 0.00–0.07)
Basophils Absolute: 0.1 10*3/uL (ref 0.0–0.1)
Basophils Relative: 1 %
Eosinophils Absolute: 0.1 10*3/uL (ref 0.0–0.5)
Eosinophils Relative: 1 %
HCT: 44.4 % (ref 36.0–46.0)
Hemoglobin: 14.8 g/dL (ref 12.0–15.0)
Immature Granulocytes: 1 %
Lymphocytes Relative: 9 %
Lymphs Abs: 1 10*3/uL (ref 0.7–4.0)
MCH: 31.8 pg (ref 26.0–34.0)
MCHC: 33.3 g/dL (ref 30.0–36.0)
MCV: 95.3 fL (ref 80.0–100.0)
Monocytes Absolute: 1.2 10*3/uL — ABNORMAL HIGH (ref 0.1–1.0)
Monocytes Relative: 12 %
Neutro Abs: 7.8 10*3/uL — ABNORMAL HIGH (ref 1.7–7.7)
Neutrophils Relative %: 76 %
Platelets: 267 10*3/uL (ref 150–400)
RBC: 4.66 MIL/uL (ref 3.87–5.11)
RDW: 12.9 % (ref 11.5–15.5)
WBC: 10.2 10*3/uL (ref 4.0–10.5)
nRBC: 0 % (ref 0.0–0.2)

## 2021-06-11 LAB — URINALYSIS, ROUTINE W REFLEX MICROSCOPIC
Bilirubin Urine: NEGATIVE
Glucose, UA: NEGATIVE mg/dL
Hgb urine dipstick: NEGATIVE
Ketones, ur: NEGATIVE mg/dL
Nitrite: NEGATIVE
Protein, ur: NEGATIVE mg/dL
Specific Gravity, Urine: 1.003 — ABNORMAL LOW (ref 1.005–1.030)
pH: 8 (ref 5.0–8.0)

## 2021-06-11 LAB — HEPARIN LEVEL (UNFRACTIONATED): Heparin Unfractionated: 0.27 IU/mL — ABNORMAL LOW (ref 0.30–0.70)

## 2021-06-11 LAB — CK: Total CK: 243 U/L — ABNORMAL HIGH (ref 38–234)

## 2021-06-11 LAB — RESP PANEL BY RT-PCR (FLU A&B, COVID) ARPGX2
Influenza A by PCR: NEGATIVE
Influenza B by PCR: NEGATIVE
SARS Coronavirus 2 by RT PCR: NEGATIVE

## 2021-06-11 LAB — MAGNESIUM: Magnesium: 1.7 mg/dL (ref 1.7–2.4)

## 2021-06-11 LAB — TROPONIN I (HIGH SENSITIVITY)
Troponin I (High Sensitivity): 298 ng/L (ref <18)
Troponin I (High Sensitivity): 250 ng/L (ref <18)

## 2021-06-11 MED ORDER — HEPARIN BOLUS VIA INFUSION
3000.0000 [IU] | Freq: Once | INTRAVENOUS | Status: AC
  Administered 2021-06-11: 3000 [IU] via INTRAVENOUS
  Filled 2021-06-11: qty 3000

## 2021-06-11 MED ORDER — MELATONIN 5 MG PO TABS
10.0000 mg | ORAL_TABLET | Freq: Every evening | ORAL | Status: DC | PRN
Start: 2021-06-11 — End: 2021-06-11

## 2021-06-11 MED ORDER — ASPIRIN 81 MG PO CHEW
324.0000 mg | CHEWABLE_TABLET | Freq: Once | ORAL | Status: AC
  Administered 2021-06-11: 324 mg via ORAL
  Filled 2021-06-11: qty 4

## 2021-06-11 MED ORDER — LEVOTHYROXINE SODIUM 100 MCG PO TABS
100.0000 ug | ORAL_TABLET | Freq: Every day | ORAL | Status: DC
  Administered 2021-06-11 – 2021-06-12 (×2): 100 ug via ORAL
  Filled 2021-06-11 (×2): qty 1

## 2021-06-11 MED ORDER — ACETAMINOPHEN 325 MG PO TABS: 650.0000 mg | ORAL_TABLET | Freq: Four times a day (QID) | ORAL | Status: DC | PRN

## 2021-06-11 MED ORDER — HEPARIN (PORCINE) 25000 UT/250ML-% IV SOLN
800.0000 [IU]/h | INTRAVENOUS | Status: DC
  Administered 2021-06-11: 700 [IU]/h via INTRAVENOUS
  Administered 2021-06-12: 800 [IU]/h via INTRAVENOUS
  Filled 2021-06-11 (×2): qty 250

## 2021-06-11 MED ORDER — METOPROLOL TARTRATE 25 MG PO TABS
25.0000 mg | ORAL_TABLET | Freq: Two times a day (BID) | ORAL | Status: DC
Start: 2021-06-11 — End: 2021-06-12
  Administered 2021-06-11 – 2021-06-12 (×3): 25 mg via ORAL
  Filled 2021-06-11 (×3): qty 1

## 2021-06-11 MED ORDER — OLANZAPINE 2.5 MG PO TABS
2.5000 mg | ORAL_TABLET | Freq: Every day | ORAL | Status: DC
Start: 2021-06-11 — End: 2021-06-12
  Administered 2021-06-11: 2.5 mg via ORAL
  Filled 2021-06-11 (×3): qty 1

## 2021-06-11 MED ORDER — ACETAMINOPHEN 650 MG RE SUPP: 650.0000 mg | Freq: Four times a day (QID) | RECTAL | Status: DC | PRN

## 2021-06-11 MED ORDER — DONEPEZIL HCL 10 MG PO TABS
10.0000 mg | ORAL_TABLET | Freq: Every day | ORAL | Status: DC
  Administered 2021-06-11: 10 mg via ORAL
  Filled 2021-06-11: qty 1

## 2021-06-11 MED ORDER — HALOPERIDOL LACTATE 5 MG/ML IJ SOLN
5.0000 mg | Freq: Four times a day (QID) | INTRAMUSCULAR | Status: DC | PRN
  Administered 2021-06-11: 5 mg via INTRAVENOUS
  Filled 2021-06-11: qty 1

## 2021-06-11 MED ORDER — MAGNESIUM SULFATE 2 GM/50ML IV SOLN
2.0000 g | Freq: Once | INTRAVENOUS | Status: AC
  Administered 2021-06-11: 2 g via INTRAVENOUS
  Filled 2021-06-11: qty 50

## 2021-06-11 MED ORDER — METOPROLOL TARTRATE 12.5 MG HALF TABLET
12.5000 mg | ORAL_TABLET | Freq: Two times a day (BID) | ORAL | Status: DC
Start: 2021-06-11 — End: 2021-06-11
  Administered 2021-06-11: 12.5 mg via ORAL
  Filled 2021-06-11: qty 1

## 2021-06-11 MED ORDER — POTASSIUM CHLORIDE CRYS ER 20 MEQ PO TBCR
20.0000 meq | EXTENDED_RELEASE_TABLET | Freq: Once | ORAL | Status: AC
  Administered 2021-06-11: 20 meq via ORAL
  Filled 2021-06-11: qty 1

## 2021-06-11 MED ORDER — LISINOPRIL 20 MG PO TABS
20.0000 mg | ORAL_TABLET | Freq: Every day | ORAL | Status: DC
Start: 2021-06-11 — End: 2021-06-12
  Administered 2021-06-11 – 2021-06-12 (×2): 20 mg via ORAL
  Filled 2021-06-11 (×2): qty 1

## 2021-06-11 MED ORDER — HALOPERIDOL LACTATE 5 MG/ML IJ SOLN
2.5000 mg | Freq: Once | INTRAMUSCULAR | Status: AC
Start: 2021-06-11 — End: 2021-06-11
  Administered 2021-06-11: 2.5 mg via INTRAVENOUS
  Filled 2021-06-11: qty 1

## 2021-06-11 MED ORDER — NITROGLYCERIN 2 % TD OINT
1.0000 [in_us] | TOPICAL_OINTMENT | Freq: Once | TRANSDERMAL | Status: AC
Start: 2021-06-11 — End: 2021-06-11
  Administered 2021-06-11: 1 [in_us] via TOPICAL
  Filled 2021-06-11: qty 1

## 2021-06-11 MED ORDER — LISINOPRIL 10 MG PO TABS: 10.0000 mg | ORAL_TABLET | Freq: Every day | ORAL | Status: DC
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
## 2021-06-11 NOTE — Assessment & Plan Note (Signed)
#)   NSTEMI: Presenting chest pain, that is resolved following initiation of nitroglycerin paste, in the setting of elevated troponin, with repeat value trending up further, and nonspecific T wave inversion in V4 through V6 as well as less than 1 mm ST elevation in V3/V4, without evidence of greater than 1 mm ST elevation.  As described above, in the setting of the patient's advanced dementia, her POA conveys that the patient would not want any invasive diagnostic or therapeutic intervention, even if life preserving, including no coronary angiography or any reperfusion therapy.  Consequently, the patient would want only medical management for ACS.  Case discussed with on-call cardiology, as above, who recommend, on the basis of the patient's wishes, initiation of medical management including 48 hours of heparin drip.  Of note, she is also received full dose aspirin x1 in the ED.  ?  ?Plan: Continuation of heparin drip.  Cardiology consulted, as above.  Refraining from initiation of statin given reported intolerance to multiple previous statin medications.  Start metoprolol tartrate 12.5 mg p.o. twice daily.  Continue home ACE inhibitor.  Continue to trend troponin.  Echocardiogram ordered for the morning.  Monitor on telemetry.  Potassium chloride 20 mEq p.o. x1 dose now.  Add on serum magnesium level.  Repeat EKG in the morning. ?  ?

## 2021-06-11 NOTE — ED Provider Notes (Signed)
?[facility] ?Provider Note ? ? ?CSN: [id number] ?Arrival date & time: 06/10/21  2135 ? ?  ? ?History ? ?No chief complaint on file. ? ? ?Shannon Grant is a 76 y.o. female. ? ?76 year old female with prior medical history as detailed below presents for evaluation.  Patient was history of dementia.  Patient with reported fall on Friday.  Patient is currently comfortable.  She is without complaint of current chest pain or shortness of breath. ? ?Patient's daughter provides additional history.  She reports that the patient had an unwitnessed fall on Friday.  Since this fall 3 days prior she has complained of intermittent chest discomfort.  She does have a history of dementia.  ? ?Patient's daughter reports that the patient has been complaining of low back pain as well.  Patient is currently not ambulating as per her normal since the fall on Friday. ? ?The history is provided by the patient, a relative and medical records.  ?Illness ?Location:  Fall on Friday, unwitnessed fall, intermittent chest discomfort ?Severity:  Unable to specify ?Onset quality:  Unable to specify ?Timing:  Unable to specify ?Progression:  Unable to specify ? ?  ? ?Home Medications ?Prior to Admission medications   ?Medication Sig Start Date End Date Taking? Authorizing Provider  ?cholecalciferol (VITAMIN D3) 25 MCG (1000 UT) tablet Take 1 tablet (1,000 Units total) by mouth daily. 08/19/18   Magrinat, Virgie Dad, MD  ?donepezil (ARICEPT) 10 MG tablet Take half tablet (5 mg) daily for 2 weeks, then increase to the full tablet at 10 mg daily 01/31/21   Rondel Jumbo, PA-C  ?levothyroxine (SYNTHROID) 112 MCG tablet 1 tablet in the morning on an empty stomach 11/02/20   [provider]  ?levothyroxine (SYNTHROID, LEVOTHROID) 125 MCG tablet Take 137 mcg by mouth daily before breakfast.  01/19/17   [provider]  ?lisinopril (PRINIVIL,ZESTRIL) 10 MG tablet Take 1 tablet (10 mg total) by mouth  daily. ?Patient taking differently: Take 10 mg by mouth every morning. 09/19/14   Boelter, Genelle Gather, NP  ?Multiple Vitamins-Minerals (MULTIVITAMIN ADULT PO) Take 1 tablet by mouth daily.    [provider]  ?   ? ?Allergies    ?Codeine, Rivaroxaban, Statins, and Ciprofloxacin   ? ?Review of Systems   ?Review of Systems  ?All other systems reviewed and are negative. ? ?Physical Exam ?Updated Vital Signs ?BP (!) 187/118 (BP Location: Right Arm)   Pulse 96   Temp 98.2 ?F (36.8 ?C) (Oral)   Resp 19   SpO2 100%  ?Physical Exam ?Vitals and nursing note reviewed.  ?Constitutional:   ?   General: She is not in acute distress. ?   Appearance: Normal appearance. She is well-developed.  ?HENT:  ?   Head: Normocephalic and atraumatic.  ?Eyes:  ?   Conjunctiva/sclera: Conjunctivae normal.  ?   Pupils: Pupils are equal, round, and reactive to light.  ?Cardiovascular:  ?   Rate and Rhythm: Normal rate and regular rhythm.  ?   Heart sounds: Normal heart sounds.  ?Pulmonary:  ?   Effort: Pulmonary effort is normal. No respiratory distress.  ?   Breath sounds: Normal breath sounds.  ?Abdominal:  ?   General: There is no distension.  ?   Palpations: Abdomen is soft.  ?   Tenderness: There is no abdominal tenderness.  ?Musculoskeletal:     ?   General: No deformity. Normal range of motion.  ?   Cervical back:  Normal range of motion and neck supple.  ?   Comments: Mild tenderness to palpation overlying the left lateral hip  ?Skin: ?   General: Skin is warm and dry.  ?Neurological:  ?   General: No focal deficit present.  ?   Mental Status: She is alert and oriented to person, place, and time.  ? ? ?ED Results / Procedures / Treatments   ?Labs ?(all labs ordered are listed, but only abnormal results are displayed) ?Labs Reviewed  ?CBC WITH DIFFERENTIAL/PLATELET - Abnormal; Notable for the following components:  ?    Result Value  ? Hemoglobin 16.4 (*)   ? HCT 49.4 (*)   ? All other components within normal limits   ?COMPREHENSIVE METABOLIC PANEL - Abnormal; Notable for the following components:  ? Glucose, Bld 100 (*)   ? Total Protein 6.4 (*)   ? GFR, Estimated 58 (*)   ? All other components within normal limits  ?TROPONIN I (HIGH SENSITIVITY) - Abnormal; Notable for the following components:  ? Troponin I (High Sensitivity) 284 (*)   ? All other components within normal limits  ?RESP PANEL BY RT-PCR (FLU A&B, COVID) ARPGX2  ?CK  ?URINALYSIS, ROUTINE W REFLEX MICROSCOPIC  ?TROPONIN I (HIGH SENSITIVITY)  ? ? ?EKG ?EKG Interpretation ? ?Date/Time:  Monday June 10 2021 21:38:14 EDT ?Ventricular Rate:  89 ?PR Interval:  169 ?QRS Duration: 91 ?QT Interval:  321 ?QTC Calculation: 391 ?R Axis:   1 ?Text Interpretation: Sinus rhythm Abnormal R-wave progression, early transition Minimal ST elevation, anterior leads Lateral leads are also involved Confirmed by Dene Gentry [id number]) on 06/10/2021 10:28:12 PM ? ?Radiology ?DG Chest 1 View ? ?Result Date: 06/10/2021 ?CLINICAL DATA:  Chest pain following fall. EXAM: CHEST  1 VIEW COMPARISON:  06/18/2015. FINDINGS: The heart size and mediastinal contours are within normal limits. There is atherosclerotic calcification of the aorta. No consolidation, effusion, or pneumothorax. No acute osseous abnormality. Surgical clips are noted in the cervical soft tissues and right axilla. IMPRESSION: No active disease. Electronically Signed   By: Brett Fairy M.D.   On: 06/10/2021 22:30  ? ?DG Hip Unilat W or Wo Pelvis 2-3 Views Left ? ?Result Date: 06/10/2021 ?CLINICAL DATA:  Pain following fall. EXAM: DG HIP (WITH OR WITHOUT PELVIS) 2-3V LEFT COMPARISON:  None. FINDINGS: There is no evidence of hip fracture or dislocation. Total hip arthroplasty changes are noted bilaterally without evidence of hardware loosening. Coarse calcifications are present in the pelvis, likely reflecting uterine fibroid. IMPRESSION: No acute fracture or dislocation. Electronically Signed   By: Brett Fairy M.D.   On:  06/10/2021 22:29   ? ?Procedures ?Procedures  ? ? ?Medications Ordered in ED ?Medications - No data to display ? ?ED Course/ Medical Decision Making/ A&P ?  ?                        ?Medical Decision Making ?Amount and/or Complexity of Data Reviewed ?Labs: ordered. ?Radiology: ordered. ? ? ? ?Medical Screen Complete ? ?This patient presented to the ED with complaint of unwitnessed fall that occurred 3 days prior, intermittent complaint of chest discomfort. ? ?This complaint involves an extensive number of treatment options. The initial differential diagnosis includes, but is not limited to, traumatic injury related to fall, metabolic abnormality, ACS, etc. ? ?This presentation is: Acute, Chronic, Self-Limited, Previously Undiagnosed, Uncertain Prognosis, Complicated, Systemic Symptoms, and Threat to Life/Bodily Function ? ?Patient is presenting for evaluation of reported fall that  occurred 3 days prior.  Patient with decreased ability to ambulate since this fall. ? ?Patient with apparent complaint of intermittent chest discomfort since the fall 3 days ago. ? ?Patient is pleasantly confused.  She is without current complaint. ? ?Majority of significant history obtained from patient's daughter who is at bedside. ? ?Initial screening labs ordered.  Initial imaging ordered. ? ?Dr. Stark Jock is aware of pending labs and need for disposition. ? ? ?Co morbidities that complicated the patient's evaluation ? ?Dementia ? ? ?Additional history obtained: ? ?Additional history obtained from Family ?External records from outside sources obtained and reviewed including prior ED visits and prior Inpatient records.  ? ? ?Lab Tests: ? ?I ordered and personally interpreted labs.  The pertinent results include:  CBC CMP TROP ? ?Cardiac Monitoring: ? ?The patient was maintained on a cardiac monitor.  I personally viewed and interpreted the cardiac monitor which showed an underlying rhythm of: NSR ? ?Problem List / ED Course: ? ?Fall, chest  pain ? ? ?Reevaluation: ? ?After the interventions noted above, I reevaluated the patient and found that they have: stayed the same ? ? ?Disposition: ? ?After consideration of the diagnostic results and the patients response t

## 2021-06-11 NOTE — Assessment & Plan Note (Signed)
#)   Essential Hypertension: documented h/o such, with outpatient antihypertensive regimen including lisinopril.   ?  ?Plan: Close monitoring of subsequent BP via routine VS. in the setting of concern for presenting ACS, will continue home lisinopril. ?  ?  ?

## 2021-06-11 NOTE — Consult Note (Addendum)
? ?                                                                                ?Consultation Note ?Date: 06/11/2021  ? ?Patient Name: Shannon Grant  ?DOB: 08/17/1945  MRN: [id number]  Age / Sex: 76 y.o., female  ?PCP: Maurice Small, MD ?Referring Physician: Darliss Cheney, MD ? ?Reason for Consultation: Establishing goals of care ? ?HPI/Patient Profile: 76 y.o. female  with past medical history of dementia, hypothyroidism, and hypertension who presented to the emergency department  on 06/10/2021 with chest pain. Found to have elevated high-sensitivity troponin and abnormal EKG consistent with NSTEMI. Cardiology recommended medical management for potential ACS, including heparin infusion for 48 hours.  ? ?Clinical Assessment and Goals of Care: ?I have reviewed medical records including EPIC notes, labs and imaging. Note that patient was agitated and confused overnight. I went to see patient at bedside. She is oriented to self only. Denies chest pain or shortness of breath.  ? ?Patient's daughter Shannon Grant is at bedside, and we walked to the [facility] waiting room to discuss diagnosis, prognosis, GOC, EOL wishes, disposition, and options. ?I introduced Palliative Medicine as specialized medical care for people living with serious illness. It focuses on providing relief from the symptoms and stress of a serious illness.  ? ?We discussed a brief life review of the patient. 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 She has 2 daughters. Shannon Grant lives locally in the [place] area, but the other daughter lives in [place]. Shannon Grant describes her mother as a Hospital doctor" and "organic" - she made most of their food growing up. After retiring, she was diagnosed with breast cancer and underwent chemo and radiation therapy. Shortly after this, Shannon Grant started noticing her mother had memory issues, which she blamed on "chemo brain". Eventually Shannon Grant realized that her mother wasn't paying her bills, and so she took this over about 4-5 years  ago. ? ?Shannon Grant had been living independently in her town home in [place] up until 3/10 when she fell. Shannon Grant has been staying with her since then. Shannon Grant was very lethargic after the fall and Shannon Grant reports thinking her mother was near death. She reached out to her mother's PCP on 3/11 and was able to initiate a referral to hospice. When hospice assessed Shannon Grant they initially said she was too high functioning but were later able to qualify her on the diagnosis of weight loss.  ? ?We discussed [name]'s current illness and what it means in the larger context of her ongoing co-morbidities. Reviewed echo results - patient has a large left ventricular mass/thrombus as well as heart failure with reduced ejection fracture of 35-40%. Shannon Grant has discussed with cariology and agreed to anticoagulation for now, but verbalizes understanding the risk with patients who are at risk for falls. Shannon Grant is clear that she is not interested in pursuing additional cardiac work-up or any type of invasive interventions.  ? ?We reviewed that dementia is a progressive, non-curable disease underlying the patient's current acute medical conditions. We reviewed specific indicators of advanced dementia, including decreased ability to communicate, decreased mobility, decreased oral intake, and incontinence of bowel/bladder. We discussed that behavioral disturbances (such as agitation) are common  and challenging. Shannon Grant is open to starting medication to help reduce agitation/anxiety.  ? ?The difference between full scope medical intervention and comfort care was considered. Reviewed the concept of a comfort path, emphasizing that this path involves de-escalating and stopping full scope medical interventions, allowing a natural course to occur. Discussed that the goal is comfort and dignity rather than cure/prolonging life. Reviewed the philosophy and benefits of hospice care, emphasizing it is about supporting the patient where they are  while allowing the natural course to occur. Discussed the hospice team can provide personal care, support for the family, and help keep patient out of the hospital.  ? ?Shannon Grant fully embraces the hospice philosophy of comfort focused holistic care. She is hopeful to avoid having her mother hospitalized moving forward. Shannon Grant plans to stay with her mother 24/7 at home on discharge. She expresses some concern about limited support to care for her mother - she has her husband and daughter only. She is currently looking into private-pay care giving services to help provide additional support. ? ?Questions and concerns were addressed.  The daughter was encouraged to call with questions or concerns.  ? ? ?Primary decision maker: Shannon Grant, daughter and HCPOA (I have requested a copy of document) ?  ? ?SUMMARY OF RECOMMENDATIONS   ?DNR/DNI as previously documented ?Continue current interventions ?Goal of care is to return home with hospice - daughter will be staying with patient 24/7 ?Confirmed daughter is ok with anticoagulation ?No further work-up or any type of invasive interventions ?PMT will continue to follow ? ?Symptom Management:  ?Olanzapine 2.5 mg daily at bedtime ? ?Prognosis:  ?< 6 months ? ?Discharge Planning: Home with Hospice  ? ?  ? ?Primary Diagnoses: ?Present on Admission: ? NSTEMI (non-ST elevated myocardial infarction) ([place]) ? Dementia ([place]) ? Essential hypertension ? Thyroid disease ? ? ?I have reviewed the medical record, interviewed the patient and family, and examined the patient. The following aspects are pertinent. ? ?Past Medical History:  ?Diagnosis Date  ? Arthritis   ? Breast cancer ([facility]) 01/24/13  ? right, inv mammary  ? Cervical radiculopathy   ? Fibroid   ? High cholesterol   ? History of blood transfusion   ? History of chemotherapy   ? History of hiatal hernia   ? History of urinary tract infection   ? Hx of radiation therapy 08/23/13- 10/05/13  ? right breast 5000 cGy 25 sessions, right  breast boost 1000 cGy 5 sessions  ? Hypertension   ? Hypothyroidism   ? Pain   ? left shoulder radiating down left arm and hand / numbness and tingling pt states MD has informed her that it is related to neck issues  ? Personal history of chemotherapy 2014  ? rt breast  ? Personal history of radiation therapy 2014  ? rt breast  ? Thyroid disease   ? hypothyroid  ? Trochanteric bursitis of right hip   ? ? ?Family History  ?Problem Relation Age of Onset  ? Suicidality Mother   ? Heart attack Father   ? Congestive Heart Failure Father   ? Heart disease Father   ? Cancer Maternal Uncle   ?     unknown  ? Heart attack Maternal Uncle   ?     MI age 24-50  ? Kidney disease Maternal Grandfather   ? Breast cancer Paternal Aunt 34  ? Breast cancer Maternal Grandmother   ? Breast cancer Paternal Grandmother   ? ?Scheduled Meds: ? donepezil  10 mg Oral QHS  ? levothyroxine  100 mcg Oral QAC breakfast  ? lisinopril  20 mg Oral Daily  ? metoprolol tartrate  25 mg Oral BID  ? ?Continuous Infusions: ? heparin 800 Units/hr (06/11/21 1259)  ? ?PRN Meds:.acetaminophen **OR** acetaminophen, haloperidol lactate ? ? ?Allergies  ?Allergen Reactions  ? Codeine Nausea And Vomiting  ? Rivaroxaban Other (See Comments)  ? Statins Other (See Comments)  ?  (Including Red Yeast Rice) Causes muscle cramps  ? Ciprofloxacin Rash  ? ?Review of Systems  ?Unable to perform ROS: Dementia  ? ?Physical Exam ?Vitals reviewed.  ?Constitutional:   ?   General: She is not in acute distress. ?   Comments: Frail, chronically ill-appearing  ?Pulmonary:  ?   Effort: Pulmonary effort is normal.  ?Neurological:  ?   Mental Status: She is alert.  ?Psychiatric:     ?   Cognition and Memory: Cognition is impaired. Memory is impaired.  ? ? ?Vital Signs: BP (!) 142/84 (BP Location: Left Arm)   Pulse 91   Temp 98.1 ?F (36.7 ?C) (Oral)   Resp 18   Ht '5\' 3"'$  (1.6 m)   Wt 54 kg   SpO2 98%   BMI 21.09 kg/m?  ?Pain Scale: 0-10 ?  ?Pain Score: 2  ? ? ?SpO2: SpO2: 98  % ?O2 Device:SpO2: 98 % ?O2 Flow Rate: .  ? ?IO: Intake/output summary:  ?Intake/Output Summary (Last 24 hours) at 06/11/2021 1527 ?Last data filed at 06/11/2021 1503 ?Gross per 24 hour  ?Intake 289.66 ml  ?Output 300 ml

## 2021-06-11 NOTE — Progress Notes (Signed)
Echocardiogram ?2D Echocardiogram has been performed. ? ?Arlyss Gandy ?06/11/2021, 9:56 AM ?

## 2021-06-11 NOTE — Progress Notes (Signed)
HOSPITAL MEDICINE OVERNIGHT EVENT NOTE   ? ?Notified by nursing that patient has become increasingly agitated since arriving on the medical floor. ? ?Of note, patient has a known history of advanced dementia.  While there is family at the bedside and there have been frequent attempts by nursing to calm the patient and redirect the patient she continues to become increasingly agitated and confused, regularly attempting to pull out her IVs and telemetry leads and attempting to get out of bed.  Patient is placing herself at risk and impeding medical care. ? ?We will place an order for 2.5 mg of Haldol x1 dose and reassess. ? ?Vernelle Emerald  MD ?[facility]  ? ? ? ? ? ? ? ? ? ? ?

## 2021-06-11 NOTE — ED Provider Notes (Addendum)
?  Physical Exam  ?BP (!) 187/118 (BP Location: Right Arm)   Pulse 96   Temp 98.2 ?F (36.8 ?C) (Oral)   Resp 19   SpO2 100%  ? ?Physical Exam ?Vitals and nursing note reviewed.  ?Constitutional:   ?   General: She is not in acute distress. ?   Appearance: She is well-developed. She is not diaphoretic.  ?HENT:  ?   Head: Normocephalic and atraumatic.  ?Cardiovascular:  ?   Rate and Rhythm: Normal rate and regular rhythm.  ?   Heart sounds: No murmur heard. ?  No friction rub. No gallop.  ?Pulmonary:  ?   Effort: Pulmonary effort is normal. No respiratory distress.  ?   Breath sounds: Normal breath sounds. No wheezing.  ?Abdominal:  ?   General: Bowel sounds are normal. There is no distension.  ?   Palpations: Abdomen is soft.  ?   Tenderness: There is no abdominal tenderness.  ?Musculoskeletal:     ?   General: Normal range of motion.  ?   Cervical back: Normal range of motion and neck supple.  ?Skin: ?   General: Skin is warm and dry.  ?Neurological:  ?   General: No focal deficit present.  ?   Mental Status: She is alert and oriented to person, place, and time.  ? ? ?Procedures  ?Procedures ? ?ED Course / MDM  ?  ?Care assumed from Dr. Francia Greaves at shift change.  Patient with history of dementia brought for evaluation of chest pain.  Patient initially fell 3 days ago, then experienced chest pain afterward which resolved.  The chest pain returned this evening. ? ?Care signed out to me awaiting results of troponin and imaging studies.  The troponin has returned and is elevated at 284.  Upon reviewing her EKG, there are T wave abnormalities that are concerning for ACS.  The EKG and troponin results were discussed with Dr. Alfred Levins from cardiology.  He has recommended starting the patient on 48 hours of heparin and nitroglycerin paste.  These orders have been placed. ? ?In discussion with the daughter and patient herself, both are in agreement that the patient would not want a heart catheterization or anything invasive.   The daughter is in the process of making arrangements with hospice and wants no heroic measures taken.  Care discussed with Dr. Velia Meyer or from the hospitalist service and will admit. ? ? ?CRITICAL CARE ?Performed by: Veryl Speak ?Total critical care time: 35 minutes ?Critical care time was exclusive of separately billable procedures and treating other patients. ?Critical care was necessary to treat or prevent imminent or life-threatening deterioration. ?Critical care was time spent personally by me on the following activities: development of treatment plan with patient and/or surrogate as well as nursing, discussions with consultants, evaluation of patient's response to treatment, examination of patient, obtaining history from patient or surrogate, ordering and performing treatments and interventions, ordering and review of laboratory studies, ordering and review of radiographic studies, pulse oximetry and re-evaluation of patient's condition. ? ? ?  ?Veryl Speak, MD ?06/11/21 0146 ? ?  ?Veryl Speak, MD ?06/11/21 0147 ? ?

## 2021-06-11 NOTE — [place] Notes (Signed)
[place] 6E11 - [facility]) Hospitalized patient note. ? ?This is a current [place] home hospice patient with a terminal diagnosis of abnormal weight loss.  Patient was admitted to hospice service on 06/10/2021. [facility] received a call from the patient's daughter reporting the patient was experiencing chest pain and was concern patient was having a heart attack. When call was returned to daughter she stated she had taken patient to [facility] ER. Patient was admitted with chest pain. Per Dr. Cherie Ouch with [facility] this is a hospice related admission. ? ?Visited at bedside. Received report from nurse. Patient was awake, alert and in no apparent distress. She denied pain when asked if she hurt anywhere. Spoke with her daughter, [name] before and after visit to provide support and answer questions. ? ?Patient is appropriate of inpatient admission due to diagnostics and IV medications.  ? ?VS: 98.1, 149/94, 82, 20, 98% RA ?I/O: 120/300 ? ?Abnormal Labs:  ?06/11/21 05:29 ?Glucose: 109 (H) ?Albumin: 3.2 (L) ?Total Protein: 5.5 (L) ?NEUT#: 7.8 (H) ?Monocyte #: 1.2 (H) ?06/11/21 05:29 ?Troponin I (High Sensitivity): 250 (HH) ?06/11/21 00:57 ?Troponin I (High Sensitivity): 298 (HH) ?06/10/21 21:46 ?Troponin I (High Sensitivity): 284 (HH) ?06/11/21 00:56 ?CK Total: 243 (H) ? ?Diagnostics: ?Echo cardiogram ? 1. There is an ovoid, large (20 x16 mm diameter) hyperechoic partially  ?mobile mass attached with a relatively narrow base (7 mm) to the  ?inferoapical endocardium, most likely an infarction-related thrombus. Left  ?ventricular ejection fraction, by  ?estimation, is 35 to 40%. The left ventricle has moderately decreased  ?function. The left ventricle demonstrates regional wall motion  ?abnormalities (see scoring diagram/findings for description). There is  ?moderate concentric left ventricular hypertrophy.  ?Left ventricular diastolic parameters are consistent with Grade I  ?diastolic dysfunction (impaired  relaxation). Elevated left atrial  ?pressure.  ? ?IV/PRN medications: Heparin 800u/hr IV, Haldol '5mg'$  IV PRN @ 1326 ? ?Problem list: ?NSTEMI  ?- Patient presented complaining of chest pain (patient unable to describe symptoms due to advanced dementia)  ?- hsTn 284>>298>>250 ?- Echo with new WMA, newly reduced EF to 35-40% ?- As patient is on palliative care and wishes to avoid invasive diagnostic or therapeutic interventions, we will continue to manage medically  ?- Continue IV Heparin for 48 hours  ?- Continue lopressor 25 mg BID  ?- Documented intolerance of statins ?- Daily ASA  ?  ?Left Ventricular Thrombus  ?- Echo this admission showed an void, large (20 x16 mm diameter) hyperechoic partially mobile mass attached with a relatively narrow base (7 mm) to the inferoapical endocardium, most likely an infarction-related thrombus. ?- After patient receives 48 hours of IV heparin, consider transition to [place]. However, patient does have advanced dementia and recent falls so may not be an ideal candidate. Will discuss with MD ?  ?Chronic combined systolic and diastolic heart failure  ?- Echo this admission showed EF 41-28%, grade I diastolic dysfunction  ?- Patient euvolemic on exam, O2 sats stable on room air  ?- Continue metoprolol, lisinopril  ?  ?Otherwise per primary  ?-Dementia  ?- Hypothyroidism  ?- Abnormal weight loss (per hospice)  ? ?Discharge Planning: Return home with hospice when stable for discharge. ?Family Contact: Spoke with daughter  ?IDG: updated ?Goals of Care: DNR. No invasive procedures or aggressive therapies. Treat medically for chest pain and NSTEMI ? ?Transfer Summary and medication list placed on shadow chart. ? ?Should patient need ambulance transport at discharge please use [facility] as [facility] contracts this service with them  for our active hospice patients. ? ?Please do not hesitate to call with questions.   ?Thank you,   ?Farrel Gordon, RN, CCM      ?[facility] Hospital Liaison   ?[phone]  [phone] ? ? ? ?

## 2021-06-11 NOTE — [place] Notes (Signed)
ANTICOAGULATION CONSULT NOTE - Initial Consult ? ?Pharmacy Consult for Heparin  ?Indication: chest pain/ACS ? ?Allergies  ?Allergen Reactions  ? Codeine Nausea And Vomiting  ? Rivaroxaban Other (See Comments)  ? Statins Other (See Comments)  ?  (Including Red Yeast Rice) Causes muscle cramps  ? Ciprofloxacin Rash  ? ? ? ?Vital Signs: ?Temp: 98.2 ?F (36.8 ?C) (03/13 2136) ?Temp Source: Oral (03/13 2136) ?BP: 187/118 (03/13 2136) ?Pulse Rate: 96 (03/13 2136) ? ?Labs: ?Recent Labs  ?  06/10/21 ?2146 06/11/21 ?4098 06/11/21 ?1191  ?HGB 16.4*  --   --   ?HCT 49.4*  --   --   ?PLT 256  --   --   ?CREATININE 1.00  --   --   ?CKTOTAL  --  243*  --   ?TROPONINIHS 284*  --  298*  ? ? ?CrCl cannot be calculated (Unknown ideal weight.). ? ? ?Medical History: ?Past Medical History:  ?Diagnosis Date  ? Arthritis   ? Breast cancer ([place]) 01/24/13  ? right, inv mammary  ? Cervical radiculopathy   ? Fibroid   ? High cholesterol   ? History of blood transfusion   ? History of chemotherapy   ? History of hiatal hernia   ? History of urinary tract infection   ? Hx of radiation therapy 08/23/13- 10/05/13  ? right breast 5000 cGy 25 sessions, right breast boost 1000 cGy 5 sessions  ? Hypertension   ? Hypothyroidism   ? Pain   ? left shoulder radiating down left arm and hand / numbness and tingling pt states MD has informed her that it is related to neck issues  ? Personal history of chemotherapy 2014  ? rt breast  ? Personal history of radiation therapy 2014  ? rt breast  ? Thyroid disease   ? hypothyroid  ? Trochanteric bursitis of right hip   ? ? ? ?Assessment: ?76 y/o F with chest pain and elevated troponin. Starting heparin. CBC/renal function ok. PTA meds reviewed.  ? ?Goal of Therapy:  ?Heparin level 0.3-0.7 units/ml ?Monitor platelets by anticoagulation protocol: Yes ?  ?Plan:  ?Heparin 3000 units BOLUS ?Start heparin drip at 700 units/hr ?1000 Heparin level ?Daily CBC/Heparin level ?Monitor for bleeding ? ? ?Narda Bonds, PharmD,  BCPS ?Clinical Pharmacist ?Phone: [phone] ? ? ? ?

## 2021-06-11 NOTE — Assessment & Plan Note (Signed)
#)   acquired hypothyroidism: documented h/o such, on Synthroid as outpatient.   Plan: cont home Synthroid.     

## 2021-06-11 NOTE — [place] Notes (Signed)
This is a very pleasant 76 year old lady with a history of advanced dementia, hypertension, acquired hypothyroidism who was admitted under hospitalist service early morning with a complaint of chest pain and she was diagnosed with non-ST elevation MI and was started on heparin drip after discussion with the cardiology.  Seen and examined this morning.  She was getting echo done at that point in time.  Patient was alert and oriented to self but she was not oriented to anything else.  She was pleasant.  She had no complaints.  I had a lengthy discussion with patient's daughter over the phone.  Daughter informed me that they had enrolled the patient to hospice yesterday only few hours before bringing her to the ED.  She wanted me to switch her to DNR now.  She was also interested in talking to hospice.  It appears that patient has ventricular thrombosis.  She is already on heparin drip.  Cardiology has been formally consulted now as well.  We will defer further management to cardiology however there is risk that patient may decline further and thus I have consulted palliative care to discuss further with the family. ?

## 2021-06-11 NOTE — Plan of Care (Signed)
?  Problem: Clinical Measurements: ?Goal: Ability to maintain clinical measurements within normal limits will improve ?Outcome: Progressing ?  ?Problem: Clinical Measurements: ?Goal: Cardiovascular complication will be avoided ?Outcome: Progressing ?  ?Problem: Coping: ?Goal: Level of anxiety will decrease ?Outcome: Progressing ?  ?Problem: Pain Managment: ?Goal: General experience of comfort will improve ?Outcome: Progressing ?  ?Problem: Safety: ?Goal: Ability to remain free from injury will improve ?Outcome: Progressing ?  ?Problem: Skin Integrity: ?Goal: Risk for impaired skin integrity will decrease ?Outcome: Progressing ?  ?

## 2021-06-11 NOTE — [place] Notes (Signed)
ANTICOAGULATION CONSULT NOTE - Initial Consult ? ?Pharmacy Consult for Heparin  ?Indication: chest pain/ACS ? ?Allergies  ?Allergen Reactions  ? Codeine Nausea And Vomiting  ? Rivaroxaban Other (See Comments)  ? Statins Other (See Comments)  ?  (Including Red Yeast Rice) Causes muscle cramps  ? Ciprofloxacin Rash  ? ? ? ?Vital Signs: ?Temp: 98.1 ?F (36.7 ?C) (03/14 1053) ?Temp Source: Oral (03/14 1053) ?BP: 149/94 (03/14 1053) ?Pulse Rate: 82 (03/14 1053) ? ?Labs: ?Recent Labs  ?  06/10/21 ?2146 06/11/21 ?8916 06/11/21 ?9450 06/11/21 ?3888 06/11/21 ?1010  ?HGB 16.4*  --   --  14.8  --   ?HCT 49.4*  --   --  44.4  --   ?PLT 256  --   --  267  --   ?HEPARINUNFRC  --   --   --   --  0.27*  ?CREATININE 1.00  --   --  0.97  --   ?CKTOTAL  --  243*  --   --   --   ?TROPONINIHS 284*  --  298* 250*  --   ? ? ? ?Estimated Creatinine Clearance: 40.8 mL/min (by C-G formula based on SCr of 0.97 mg/dL). ? ? ?Medical History: ?Past Medical History:  ?Diagnosis Date  ? Arthritis   ? Breast cancer ([place]) 01/24/13  ? right, inv mammary  ? Cervical radiculopathy   ? Fibroid   ? High cholesterol   ? History of blood transfusion   ? History of chemotherapy   ? History of hiatal hernia   ? History of urinary tract infection   ? Hx of radiation therapy 08/23/13- 10/05/13  ? right breast 5000 cGy 25 sessions, right breast boost 1000 cGy 5 sessions  ? Hypertension   ? Hypothyroidism   ? Pain   ? left shoulder radiating down left arm and hand / numbness and tingling pt states MD has informed her that it is related to neck issues  ? Personal history of chemotherapy 2014  ? rt breast  ? Personal history of radiation therapy 2014  ? rt breast  ? Thyroid disease   ? hypothyroid  ? Trochanteric bursitis of right hip   ? ? ? ?Assessment: ?77 y/o F with chest pain and elevated troponin. Started heparin. CBC/renal function ok. PTA meds reviewed.  ?Heparin drip 700 uts/hr Heparin level 0.27 < goal  ? ?Goal of Therapy:  ?Heparin level 0.3-0.7  units/ml ?Monitor platelets by anticoagulation protocol: Yes ?  ?Plan:  ?Increase heparin drip at 800 units/hr ?Daily CBC/Heparin level ?Monitor for bleeding ? ? ? ?Bonnita Nasuti Pharm.D. CPP, BCPS ?Clinical Pharmacist ?[phone] ?06/11/2021 12:24 PM  ? ? ? ? ?

## 2021-06-11 NOTE — Care Management (Signed)
?  Transition of Care (TOC) Screening Note ? ? ?Patient Details  ?Name: Shannon Grant ?Date of Birth: 11/22/1945 ? ? ?Transition of Care (TOC) CM/SW Contact:    ?Graves-Bigelow, Ocie Cornfield, RN ?Phone Number: ?06/11/2021, 3:04 PM ? ? ? ?Transition of Care Department [facility]) has reviewed the patient. Patient is currently active with [facility] for [facility]. Case Manager will continue to follow for disposition needs.  ?

## 2021-06-11 NOTE — [place] Notes (Signed)
[employer] [facility]) Hospital Liaison: RN note   ? ?This is a new current [place] home hospice patient that was admitted to service yesterday 06/10/2021 with a terminal diagnosis of abnormal weight loss.  ? ?[facility] hospital liaison will continue to follow.  ? ?A Please do not hesitate to call with questions.   ?Thank you,   ?Farrel Gordon, RN, CCM      ?[facility] Hospital Liaison   ?[phone] ?

## 2021-06-11 NOTE — Plan of Care (Signed)
?  Problem: Clinical Measurements: ?Goal: Ability to maintain clinical measurements within normal limits will improve ?Outcome: Progressing ?  ?Problem: Clinical Measurements: ?Goal: Cardiovascular complication will be avoided ?Outcome: Progressing ?  ?Problem: Coping: ?Goal: Level of anxiety will decrease ?Outcome: Progressing ?  ?Problem: Safety: ?Goal: Ability to remain free from injury will improve ?Outcome: Progressing ?  ?Problem: Skin Integrity: ?Goal: Risk for impaired skin integrity will decrease ?Outcome: Progressing ?  ?

## 2021-06-11 NOTE — Assessment & Plan Note (Signed)
#)   Dementia: POA conveys the patient's history of advanced dementia, for which initiation of hospice is being considered.  She is on Aricept as an outpatient. ?  ?Plan: Continue home Aricept. ?  ?  ?
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
## 2021-06-12 ENCOUNTER — Other Ambulatory Visit (HOSPITAL_COMMUNITY): Payer: Self-pay

## 2021-06-12 DIAGNOSIS — I513 Intracardiac thrombosis, not elsewhere classified: Secondary | ICD-10-CM | POA: Diagnosis not present

## 2021-06-12 DIAGNOSIS — I5042 Chronic combined systolic (congestive) and diastolic (congestive) heart failure: Secondary | ICD-10-CM | POA: Diagnosis not present

## 2021-06-12 DIAGNOSIS — Z7189 Other specified counseling: Secondary | ICD-10-CM

## 2021-06-12 DIAGNOSIS — F0390 Unspecified dementia without behavioral disturbance: Secondary | ICD-10-CM | POA: Diagnosis not present

## 2021-06-12 DIAGNOSIS — I214 Non-ST elevation (NSTEMI) myocardial infarction: Secondary | ICD-10-CM | POA: Diagnosis not present

## 2021-06-12 DIAGNOSIS — Z515 Encounter for palliative care: Secondary | ICD-10-CM

## 2021-06-12 DIAGNOSIS — Z66 Do not resuscitate: Secondary | ICD-10-CM

## 2021-06-12 LAB — CBC
HCT: 44.5 % (ref 36.0–46.0)
Hemoglobin: 15.3 g/dL — ABNORMAL HIGH (ref 12.0–15.0)
MCH: 32.5 pg (ref 26.0–34.0)
MCHC: 34.4 g/dL (ref 30.0–36.0)
MCV: 94.5 fL (ref 80.0–100.0)
Platelets: 238 10*3/uL (ref 150–400)
RBC: 4.71 MIL/uL (ref 3.87–5.11)
RDW: 12.5 % (ref 11.5–15.5)
WBC: 9.2 10*3/uL (ref 4.0–10.5)
nRBC: 0 % (ref 0.0–0.2)

## 2021-06-12 LAB — BASIC METABOLIC PANEL
Anion gap: 13 (ref 5–15)
BUN: 15 mg/dL (ref 8–23)
CO2: 24 mmol/L (ref 22–32)
Calcium: 9.6 mg/dL (ref 8.9–10.3)
Chloride: 101 mmol/L (ref 98–111)
Creatinine, Ser: 0.85 mg/dL (ref 0.44–1.00)
GFR, Estimated: >60 mL/min (ref >60)
Glucose, Bld: 117 mg/dL — ABNORMAL HIGH (ref 70–99)
Potassium: 3.8 mmol/L (ref 3.5–5.1)
Sodium: 138 mmol/L (ref 135–145)

## 2021-06-12 LAB — HEPARIN LEVEL (UNFRACTIONATED): Heparin Unfractionated: 0.48 IU/mL (ref 0.30–0.70)

## 2021-06-12 MED ORDER — MELATONIN 5 MG PO TABS
10.0000 mg | ORAL_TABLET | Freq: Every evening | ORAL | Status: DC | PRN
  Administered 2021-06-12: 10 mg via ORAL
  Filled 2021-06-12: qty 2

## 2021-06-12 MED ORDER — APIXABAN 5 MG PO TABS
5.0000 mg | ORAL_TABLET | Freq: Two times a day (BID) | ORAL | Status: DC
  Administered 2021-06-12: 5 mg via ORAL
  Filled 2021-06-12: qty 1

## 2021-06-12 MED ORDER — METOPROLOL TARTRATE 25 MG PO TABS
25.0000 mg | ORAL_TABLET | Freq: Two times a day (BID) | ORAL | 0 refills | Status: AC
  Filled 2021-06-12: qty 60, 30d supply, fill #0

## 2021-06-12 MED ORDER — APIXABAN 5 MG PO TABS
5.0000 mg | ORAL_TABLET | Freq: Two times a day (BID) | ORAL | 0 refills | Status: AC
  Filled 2021-06-12: qty 60, 30d supply, fill #0

## 2021-06-12 MED ORDER — OLANZAPINE 2.5 MG PO TABS
2.5000 mg | ORAL_TABLET | Freq: Every day | ORAL | 0 refills | Status: AC
  Filled 2021-06-12: qty 20, 20d supply, fill #0

## 2021-06-12 NOTE — Discharge Summary (Addendum)
PatientPhysician Discharge Summary  ?Shannon Grant QQP:[id number] DOB: 12-12-1945 DOA: 06/10/2021 ? ?PCP: Maurice Small, MD ? ?Admit date: 06/10/2021 ?Discharge date: 06/12/2021 ?30 Day Unplanned Readmission Risk Score   ? ?Flowsheet Row ED to Hosp-Admission (Current) from 06/10/2021 in [facility]  ?30 Day Unplanned Readmission Risk Score (%) 13.33 Filed at 06/12/2021 1200  ? ?  ? ? This score is the patient's risk of an unplanned readmission within 30 days of being discharged (0 -100%). The score is based on dignosis, age, lab data, medications, orders, and past utilization.   ?Low:  0-14.9   Medium: 15-21.9   High: 22-29.9   Extreme: 30 and above ? ?  ? ?  ? ? ? ?Admitted From: Home ?Disposition: Home ? ?Recommendations for Outpatient Follow-up:  ?Follow up with PCP in 1-2 weeks ?Please obtain BMP/CBC in one week ?Please follow up with your PCP on the following pending results: ?Unresulted Labs (From admission, onward)  ? ?  Start     Ordered  ? 06/12/21 0500  CBC  Daily,   R     ? 06/11/21 0219  ? ?  ?  ? ?  ?  ? ? ?Home Health: None ?Equipment/Devices: None ? ?Discharge Condition: Stable but poor prognosis ?CODE STATUS: DNR ?Diet recommendation: As pleased ? ?Subjective: Seen and examined.  Alert and oriented to self at her baseline with no complaints. ? ?Brief/Interim Summary: This is a very pleasant 76 year old lady with a history of advanced dementia, hypertension, acquired hypothyroidism who was admitted under hospitalist service with a complaint of chest pain and she was diagnosed with non-ST elevation MI and was started on heparin drip after discussion with the cardiology.  Echo showed large ovoid intracardiac thrombosis with ejection fraction of 35 to 40% and wall motion abnormality and grade 1 diastolic dysfunction.  Patient was already signed up for home hospice before presenting to ED.  Cardiology officially consulted.  They had a lengthy discussion with the patient's daughter and I did as  well.  Patient's daughter did not want any invasive measures done as she wanted to continue hospice at home.  Cardiology started her on beta-blocker, she was already on lisinopril.  Patient remained hemodynamically stable.  Cardiology cleared her for discharge with instructions to transition her from heparin to Eliquis.  Daughter has agreed with the plan and she is requesting to discharge the patient today. Please note that for some reason, patient's allergy list includes Xarelto however neither patient nor the daughter can confirm what the allergy is.  She did have GI bleed at some point in time from Xarelto but they cannot confirm the reason that she was on Xarelto however after lengthy discussion, patient's daughter has agreed to discharge this patient on Eliquis. ? ?Discharge plan was discussed with patient and/or family member and they verbalized understanding and agreed with it.  ?Discharge Diagnoses:  ?Principal Problem: ?  NSTEMI (non-ST elevated myocardial infarction) ([place]) ?Active Problems: ?  Essential hypertension ?  Dementia (Shannon Grant) ?  Thyroid disease ?  Intracardiac thrombus ? ? ? ?Discharge Instructions ? ? ?Allergies as of 06/12/2021   ? ?   Reactions  ? Codeine Nausea And Vomiting  ? Rivaroxaban Other (See Comments)  ? Statins Other (See Comments)  ? (Including Red Yeast Rice) Causes muscle cramps  ? Ciprofloxacin Rash  ? ?  ? ?  ?Medication List  ?  ? ?TAKE these medications   ? ?cholecalciferol 25 MCG (1000 UNIT) tablet ?Commonly  known as: VITAMIN D3 ?Take 1 tablet (1,000 Units total) by mouth daily. ?  ?donepezil 10 MG tablet ?Commonly known as: ARICEPT ?Take half tablet (5 mg) daily for 2 weeks, then increase to the full tablet at 10 mg daily ?  ?Eliquis 5 MG Tabs tablet ?Generic drug: apixaban ?Take 1 tablet (5 mg total) by mouth 2 (two) times daily. ?  ?levothyroxine 100 MCG tablet ?Commonly known as: SYNTHROID ?Take 100 mcg by mouth daily before breakfast. ?What changed: Another medication with  the same name was removed. Continue taking this medication, and follow the directions you see here. ?  ?lisinopril 10 MG tablet ?Commonly known as: ZESTRIL ?Take 1 tablet (10 mg total) by mouth daily. ?What changed: when to take this ?  ?metoprolol tartrate 25 MG tablet ?Commonly known as: LOPRESSOR ?Take 1 tablet (25 mg total) by mouth 2 (two) times daily. ?  ?MULTIVITAMIN ADULT PO ?Take 1 tablet by mouth daily. ?  ?OLANZapine 2.5 MG tablet ?Commonly known as: ZYPREXA ?Take 1 tablet (2.5 mg total) by mouth at bedtime. ?  ? ?  ? ? Follow-up Information   ? ? Maurice Small, MD Follow up in 1 week(s).   ?Specialty: Family Medicine ?Contact information: ?[address] ?[place] [address] ?[phone] ? ? ?  ?  ? ?  ?  ? ?  ? ?Allergies  ?Allergen Reactions  ? Codeine Nausea And Vomiting  ? Rivaroxaban Other (See Comments)  ? Statins Other (See Comments)  ?  (Including Red Yeast Rice) Causes muscle cramps  ? Ciprofloxacin Rash  ? ? ?Consultations: Cardiology ? ? ?Procedures/Studies: ?DG Chest 1 View ? ?Result Date: 06/10/2021 ?CLINICAL DATA:  Chest pain following fall. EXAM: CHEST  1 VIEW COMPARISON:  06/18/2015. FINDINGS: The heart size and mediastinal contours are within normal limits. There is atherosclerotic calcification of the aorta. No consolidation, effusion, or pneumothorax. No acute osseous abnormality. Surgical clips are noted in the cervical soft tissues and right axilla. IMPRESSION: No active disease. Electronically Signed   By: Brett Fairy M.D.   On: 06/10/2021 22:30  ? ?DG Lumbar Spine Complete ? ?Result Date: 06/11/2021 ?CLINICAL DATA:  Pain after a fall EXAM: LUMBAR SPINE - COMPLETE 4+ VIEW COMPARISON:  None. FINDINGS: Five lumbar type vertebral bodies. Lumbar scoliosis convex towards the right. Mild anterior subluxation of L3 on L4, likely degenerative. Mild lateral vertebral compression on the left at L1-L2, likely chronic and related to scoliosis. No definite acute compression.  Degenerative changes throughout with narrowed interspaces and endplate osteophyte formation. Degenerative changes in the facet joints. Visualized sacrum appears intact. Rounded area of calcification in the pelvis likely represents a calcified fibroid. Aortic vascular calcifications. Postoperative changes in the hips. IMPRESSION: Degenerative changes in the lumbar spine with mild scoliosis. No acute displaced fractures identified. Electronically Signed   By: Lucienne Capers M.D.   On: 06/11/2021 00:08  ? ?CT Head Wo Contrast ? ?Result Date: 06/11/2021 ?CLINICAL DATA:  Fall, head and neck trauma. EXAM: CT HEAD WITHOUT CONTRAST CT CERVICAL SPINE WITHOUT CONTRAST TECHNIQUE: Multidetector CT imaging of the head and cervical spine was performed following the standard protocol without intravenous contrast. Multiplanar CT image reconstructions of the cervical spine were also generated. RADIATION DOSE REDUCTION: This exam was performed according to the departmental dose-optimization program which includes automated exposure control, adjustment of the mA and/or kV according to patient size and/or use of iterative reconstruction technique. COMPARISON:  09/15/2016. FINDINGS: CT HEAD FINDINGS Brain: No acute intracranial  hemorrhage or midline shift. There is a stable extra-axial soft tissue density in the temporal fossa on the right, compatible with known meningioma. Mild atrophy is noted. Subcortical and periventricular white matter hypodensities are noted bilaterally. No hydrocephalus. Vascular: Atherosclerotic calcification of the carotid siphons and vertebral arteries. No hyperdense vessel. Skull: Normal. Negative for fracture or focal lesion. Sinuses/Orbits: A few opacities are noted in the ethmoid air cells on the right. No acute orbital abnormality. Other: Old healed nasal bone fractures are noted bilaterally and there is facial bones hardware bilaterally. CT CERVICAL SPINE FINDINGS Alignment: Normal. Skull base and  vertebrae: No acute fracture. No primary bone lesion or focal pathologic process. Soft tissues and spinal canal: No prevertebral fluid or swelling. No visible canal hematoma. Disc levels: Intervertebral dis

## 2021-06-12 NOTE — Progress Notes (Addendum)
ANTICOAGULATION CONSULT NOTE  ? ?Pharmacy Consult for Heparin  ?Indication: chest pain/ACS ? ?Allergies  ?Allergen Reactions  ? Codeine Nausea And Vomiting  ? Rivaroxaban Other (See Comments)  ? Statins Other (See Comments)  ?  (Including Red Yeast Rice) Causes muscle cramps  ? Ciprofloxacin Rash  ? ? ? ?Vital Signs: ?Temp: 98.7 ?F (37.1 ?C) (03/15 0530) ?Temp Source: Oral (03/15 0530) ?BP: 141/91 (03/15 0806) ?Pulse Rate: 77 (03/15 0806) ? ?Labs: ?Recent Labs  ?  06/10/21 ?2146 06/11/21 ?6629 06/11/21 ?4765 06/11/21 ?4650 06/11/21 ?1010 06/12/21 ?0419  ?HGB 16.4*  --   --  14.8  --  15.3*  ?HCT 49.4*  --   --  44.4  --  44.5  ?PLT 256  --   --  267  --  238  ?HEPARINUNFRC  --   --   --   --  0.27* 0.48  ?CREATININE 1.00  --   --  0.97  --  0.85  ?CKTOTAL  --  243*  --   --   --   --   ?TROPONINIHS 284*  --  298* 250*  --   --   ? ? ? ?Estimated Creatinine Clearance: 45.4 mL/min (by C-G formula based on SCr of 0.85 mg/dL). ? ? ?Medical History: ?Past Medical History:  ?Diagnosis Date  ? Arthritis   ? Breast cancer ([place]) 01/24/13  ? right, inv mammary  ? Cervical radiculopathy   ? Fibroid   ? High cholesterol   ? History of blood transfusion   ? History of chemotherapy   ? History of hiatal hernia   ? History of urinary tract infection   ? Hx of radiation therapy 08/23/13- 10/05/13  ? right breast 5000 cGy 25 sessions, right breast boost 1000 cGy 5 sessions  ? Hypertension   ? Hypothyroidism   ? Pain   ? left shoulder radiating down left arm and hand / numbness and tingling pt states MD has informed her that it is related to neck issues  ? Personal history of chemotherapy 2014  ? rt breast  ? Personal history of radiation therapy 2014  ? rt breast  ? Thyroid disease   ? hypothyroid  ? Trochanteric bursitis of right hip   ? ? ? ?Assessment: ?76 y/o F with chest pain and elevated troponin. Started heparin. CBC/renal function ok. PTA meds reviewed.  ? ?Heparin level at goal this AM.  No overt bleeding or complications  noted. ? ?Interestingly has "allergy: documented to Xarelto from 8/22 - tried to call daughter to clarify- but didn't reach her- no notes to match this, but there are notes about rivastigmine intolerance - wondering if this was an error? ? ?Goal of Therapy:  ?Heparin level 0.3-0.7 units/ml ?Monitor platelets by anticoagulation protocol: Yes ?  ?Plan:  ?Continue IV heparin gtt at 800 units/hr ?Daily CBC/Heparin level ?Monitor for bleeding ?48 hrs of heparin would be ~ 2 AM on 3/16. ? ?N[place] Crane, Pharm D, BCPS, BCCP ?Clinical Pharmacist ? 06/12/2021 8:37 AM  ? ?[facility] pharmacy phone numbers are listed on [email or web address] ? ? ? ? ? ?

## 2021-06-12 NOTE — Progress Notes (Signed)
ANTICOAGULATION CONSULT NOTE  ? ?Pharmacy Consult for Heparin > Eliquis ?Indication: chest pain/ACS ? ?Allergies  ?Allergen Reactions  ? Codeine Nausea And Vomiting  ? Rivaroxaban Other (See Comments)  ? Statins Other (See Comments)  ?  (Including Red Yeast Rice) Causes muscle cramps  ? Ciprofloxacin Rash  ? ? ? ?Vital Signs: ?Temp: 98.1 ?F (36.7 ?C) (03/15 4696) ?Temp Source: Oral (03/15 2952) ?BP: 147/96 (03/15 0839) ?Pulse Rate: 77 (03/15 0806) ? ?Labs: ?Recent Labs  ?  06/10/21 ?2146 06/11/21 ?8413 06/11/21 ?2440 06/11/21 ?1027 06/11/21 ?1010 06/12/21 ?0419  ?HGB 16.4*  --   --  14.8  --  15.3*  ?HCT 49.4*  --   --  44.4  --  44.5  ?PLT 256  --   --  267  --  238  ?HEPARINUNFRC  --   --   --   --  0.27* 0.48  ?CREATININE 1.00  --   --  0.97  --  0.85  ?CKTOTAL  --  243*  --   --   --   --   ?TROPONINIHS 284*  --  298* 250*  --   --   ? ? ? ?Estimated Creatinine Clearance: 45.4 mL/min (by C-G formula based on SCr of 0.85 mg/dL). ? ? ?Medical History: ?Past Medical History:  ?Diagnosis Date  ? Arthritis   ? Breast cancer ([place]) 01/24/13  ? right, inv mammary  ? Cervical radiculopathy   ? Fibroid   ? High cholesterol   ? History of blood transfusion   ? History of chemotherapy   ? History of hiatal hernia   ? History of urinary tract infection   ? Hx of radiation therapy 08/23/13- 10/05/13  ? right breast 5000 cGy 25 sessions, right breast boost 1000 cGy 5 sessions  ? Hypertension   ? Hypothyroidism   ? Pain   ? left shoulder radiating down left arm and hand / numbness and tingling pt states MD has informed her that it is related to neck issues  ? Personal history of chemotherapy 2014  ? rt breast  ? Personal history of radiation therapy 2014  ? rt breast  ? Thyroid disease   ? hypothyroid  ? Trochanteric bursitis of right hip   ? ? ? ?Assessment: ?76 y/o F with chest pain and elevated troponin. Started heparin. CBC/renal function ok. PTA meds reviewed.  ? ?Heparin level at goal this AM.  No overt bleeding or  complications noted. ? ?Interestingly has "allergy: documented to Xarelto from 8/22 - spoke to daughter, she has notes that indicate pt had rectal bleeding on Xarelto in 2019, but I haven't been able to find an indication for Xarelto. ? ?No current issues with bleeding. ? ?Goal of Therapy:  ?Heparin level 0.3-0.7 units/ml ?Monitor platelets by anticoagulation protocol: Yes ?  ?Plan:  ?Discussed with Dr. Harl Bowie - okay to transition to Eliquis now. ?Stop IV heparin ?Start Eliquis 5 mg BID. ? ?[place] Crane, Pharm D, BCPS, BCCP ?Clinical Pharmacist ? 06/12/2021 12:38 PM  ? ?[facility] pharmacy phone numbers are listed on [email or web address] ? ? ? ? ? ? ?

## 2021-06-12 NOTE — Discharge Instructions (Addendum)
Information on my medicine - ELIQUIS? (apixaban) ? ?This medication education was reviewed with me or my healthcare representative as part of my discharge preparation.  The pharmacist that spoke with me during my hospital stay was:  ?Pat Patrick, [facility], [place] ? ?Why was Eliquis? prescribed for you? ?Eliquis? was prescribed to treat blood clots that may have been found in your heart and to reduce the risk of them occurring again. ? ?What do You need to know about Eliquis? ? ?The dose is ONE 5 mg tablet taken TWICE daily.  Eliquis? may be taken with or without food.  ? ?Try to take the dose about the same time in the morning and in the evening. If you have difficulty swallowing the tablet whole please discuss with your pharmacist how to take the medication safely. ? ?Take Eliquis? exactly as prescribed and DO NOT stop taking Eliquis? without talking to the doctor who prescribed the medication.  Stopping may increase your risk of developing a new blood clot.  Refill your prescription before you run out. ? ?After discharge, you should have regular check-up appointments with your healthcare provider that is prescribing your Eliquis?. ?   ?What do you do if you miss a dose? ?If a dose of ELIQUIS? is not taken at the scheduled time, take it as soon as possible on the same day and twice-daily administration should be resumed. The dose should not be doubled to make up for a missed dose. ? ?Important Safety Information ?A possible side effect of Eliquis? is bleeding. You should call your healthcare provider right away if you experience any of the following: ?Bleeding from an injury or your nose that does not stop. ?Unusual colored urine (red or dark brown) or unusual colored stools (red or black). ?Unusual bruising for unknown reasons. ?A serious fall or if you hit your head (even if there is no bleeding). ? ?Some medicines may interact with Eliquis? and might increase your risk of bleeding or clotting while on Eliquis?Marland Kitchen To help  avoid this, consult your healthcare provider or pharmacist prior to using any new prescription or non-prescription medications, including herbals, vitamins, non-steroidal anti-inflammatory drugs (NSAIDs) and supplements. ? ?This website has more information on Eliquis? (apixaban): http://www.eliquis.com/eliquis/home  ?

## 2021-06-12 NOTE — Progress Notes (Signed)
? ?                                                                                                                                                     ?                                                   ?Daily Progress Note  ? ?Patient Name: Shannon Grant       Date: 06/12/2021 ?DOB: 08/30/1945  Age: 76 y.o. MRN#: [id number] ?Attending Physician: Darliss Cheney, MD ?Primary Care Physician: Maurice Small, MD ?Admit Date: 06/10/2021 ? ? ?HPI/Patient Profile: 76 y.o. female  with past medical history of dementia, hypothyroidism, and hypertension who presented to the emergency department  on 06/10/2021 with chest pain. Found to have elevated high-sensitivity troponin and abnormal EKG consistent with NSTEMI. Cardiology recommended medical management for potential ACS, including heparin infusion for 48 hours.  ? ? ?Subjective: ?I went to see patient at bedside. She is oriented to self only. She appears more calm (less restless compared to yesterday). No acute complaints. Pending discharge home today with hospice.  ? ?Addendum 06/13/21: ?I spoke with daughter [name] by phone. She reports her mother has been very lethargic (mostly sleeping) since returning home. She expresses concern that she cannot care for her mother in this condition. Discussed that her mother may be more imminently approaching EOL. Discussed potential option to transfer to [facility] if appropriate. [name] states that a hospice RN is on the way to the house to assess her other's condition. Emotional support provided.  ? ? ?Objective: ? ?Physical Exam ?Vitals reviewed.  ?Pulmonary:  ?   Effort: Pulmonary effort is normal.  ?Neurological:  ?   Mental Status: She is alert. She is confused.  ?Psychiatric:     ?   Cognition and Memory: Cognition is impaired. Memory is impaired.  ?         ? ?Vital Signs: BP (!) 168/93 (BP Location: Left Arm)   Pulse 77   Temp 98.1 ?F (36.7 ?C) (Oral)   Resp 19   Ht '5\' 3"'$  (1.6 m)   Wt 51.1 kg   SpO2 97%   BMI 19.96 kg/m?   ?SpO2: SpO2: 97 % ?O2 Device: O2 Device: Room Air ?O2 Flow Rate:   ? ? ?LBM: Last BM Date : 06/11/21 ?Baseline Weight: Weight: 54 kg ?Most recent weight: Weight: 51.1 kg ? ?     ?Palliative Assessment/Data: PPS 40% ? ? ? ? ? ?Palliative Care Assessment & Plan  ? ?Assessment: ?- NSTEMI ?- ventricular thrombus ?- chronic combined systolic and diastolic heart failure ?- dementia ? ?Recommendations/Plan: ?DNR/DNI as previously documented ?Continue current interventions ?  Goal of care is to return with hospice - daughter will be staying with patient 24/7 ?No further work-up or any type of invasive interventions ?Continue olanzapine 2.5 mg daily at bedtime ? ? ?Prognosis: ? < 6 months ? ?Discharge Planning: ?Home with Hospice ? ? ? ?Thank you for allowing the Palliative Medicine Team to assist in the care of this patient. ? ?MDM - Moderate ? ?Lavena Bullion, NP ? ?Please contact Palliative Medicine Team phone at [phone] for questions and concerns.  ? ? ? ? ? ?

## 2021-06-12 NOTE — Progress Notes (Addendum)
? ?Progress Note ? ?Patient Name: DELAILA NAND ?Date of Encounter: 06/12/2021 ? ?[facility] Cardiologist: None  ? ?Subjective  ? ?Patient is resting peacefully in the bed. Denies being in any pain. Is oriented to person and place, not time.  ? ?Inpatient Medications  ?  ?Scheduled Meds: ? donepezil  10 mg Oral QHS  ? levothyroxine  100 mcg Oral QAC breakfast  ? lisinopril  20 mg Oral Daily  ? metoprolol tartrate  25 mg Oral BID  ? OLANZapine  2.5 mg Oral QHS  ? ?Continuous Infusions: ? heparin 800 Units/hr (06/12/21 0545)  ? ?PRN Meds: ?acetaminophen **OR** acetaminophen, haloperidol lactate, melatonin  ? ?Vital Signs  ?  ?Vitals:  ? 06/12/21 0005 06/12/21 0530 06/12/21 0806 06/12/21 0839  ?BP: (!) 154/98 (!) 145/98 (!) 141/91 (!) 147/96  ?Pulse: 100 83 77   ?Resp: '18 20  18  '$ ?Temp: 97.8 ?F (36.6 ?C) 98.7 ?F (37.1 ?C)  98.1 ?F (36.7 ?C)  ?TempSrc: Oral Oral  Oral  ?SpO2: 92% 97%    ?Weight:  51.1 kg    ?Height:      ? ? ?Intake/Output Summary (Last 24 hours) at 06/12/2021 0842 ?Last data filed at 06/12/2021 0545 ?Gross per 24 hour  ?Intake 612.2 ml  ?Output 900 ml  ?Net -287.8 ml  ? ?Last 3 Weights 06/12/2021 06/11/2021 01/31/2021  ?Weight (lbs) 112 lb 10.5 oz 119 lb 0.8 oz 129 lb  ?Weight (kg) 51.1 kg 54 kg 58.514 kg  ?   ? ?Telemetry  ?  ?Sinus rhythm - Personally Reviewed ? ?ECG  ?  ?No new tracings - Personally Reviewed ? ?Physical Exam  ? ?GEN: No acute distress, laying comfortably in the bed  ?Neck: No JVD ?Cardiac: RRR, no murmurs, rubs, or gallops. Radial pulses 2+ bilaterally  ?Respiratory: Anterior lung exam is clear to auscultation. No increased WOB ?GI: Soft, nontender, non-distended  ?MS: No edema; No deformity. ?Neuro:  Oriented to person and place, not oriented to time. Does not answer questions appropriately  ?Psych: Normal affect  ? ?Labs  ?  ?High Sensitivity Troponin:   ?Recent Labs  ?Lab 06/10/21 ?2146 06/11/21 ?5697 06/11/21 ?9480  ?TROPONINIHS 284* 298* 250*  ?   ?Chemistry ?Recent Labs  ?Lab  06/10/21 ?2146 06/11/21 ?1655 06/12/21 ?0419  ?NA 137 136 138  ?K 3.8 4.1 3.8  ?CL 100 102 101  ?CO2 '28 26 24  '$ ?GLUCOSE 100* 109* 117*  ?BUN '15 16 15  '$ ?CREATININE 1.00 0.97 0.85  ?CALCIUM 10.2 9.7 9.6  ?MG  --  1.7  --   ?PROT 6.4* 5.5*  --   ?ALBUMIN 3.6 3.2*  --   ?AST 41 34  --   ?ALT 27 22  --   ?ALKPHOS 51 48  --   ?BILITOT 0.7 0.8  --   ?GFRNONAA 58* >60 >60  ?ANIONGAP '9 8 13  '$ ?  ?Lipids No results for input(s): CHOL, TRIG, HDL, LABVLDL, LDLCALC, CHOLHDL in the last 168 hours.  ?Hematology ?Recent Labs  ?Lab 06/10/21 ?2146 06/11/21 ?3748 06/12/21 ?0419  ?WBC 7.6 10.2 9.2  ?RBC 5.06 4.66 4.71  ?HGB 16.4* 14.8 15.3*  ?HCT 49.4* 44.4 44.5  ?MCV 97.6 95.3 94.5  ?MCH 32.4 31.8 32.5  ?MCHC 33.2 33.3 34.4  ?RDW 12.9 12.9 12.5  ?PLT 256 267 238  ? ?Thyroid No results for input(s): TSH, FREET4 in the last 168 hours.  ?BNPNo results for input(s): BNP, PROBNP in the last 168 hours.  ?DDimer No  results for input(s): DDIMER in the last 168 hours.  ? ?Radiology  ?  ?DG Chest 1 View ? ?Result Date: 06/10/2021 ?CLINICAL DATA:  Chest pain following fall. EXAM: CHEST  1 VIEW COMPARISON:  06/18/2015. FINDINGS: The heart size and mediastinal contours are within normal limits. There is atherosclerotic calcification of the aorta. No consolidation, effusion, or pneumothorax. No acute osseous abnormality. Surgical clips are noted in the cervical soft tissues and right axilla. IMPRESSION: No active disease. Electronically Signed   By: Brett Fairy M.D.   On: 06/10/2021 22:30  ? ?DG Lumbar Spine Complete ? ?Result Date: 06/11/2021 ?CLINICAL DATA:  Pain after a fall EXAM: LUMBAR SPINE - COMPLETE 4+ VIEW COMPARISON:  None. FINDINGS: Five lumbar type vertebral bodies. Lumbar scoliosis convex towards the right. Mild anterior subluxation of L3 on L4, likely degenerative. Mild lateral vertebral compression on the left at L1-L2, likely chronic and related to scoliosis. No definite acute compression. Degenerative changes throughout with  narrowed interspaces and endplate osteophyte formation. Degenerative changes in the facet joints. Visualized sacrum appears intact. Rounded area of calcification in the pelvis likely represents a calcified fibroid. Aortic vascular calcifications. Postoperative changes in the hips. IMPRESSION: Degenerative changes in the lumbar spine with mild scoliosis. No acute displaced fractures identified. Electronically Signed   By: Lucienne Capers M.D.   On: 06/11/2021 00:08  ? ?CT Head Wo Contrast ? ?Result Date: 06/11/2021 ?CLINICAL DATA:  Fall, head and neck trauma. EXAM: CT HEAD WITHOUT CONTRAST CT CERVICAL SPINE WITHOUT CONTRAST TECHNIQUE: Multidetector CT imaging of the head and cervical spine was performed following the standard protocol without intravenous contrast. Multiplanar CT image reconstructions of the cervical spine were also generated. RADIATION DOSE REDUCTION: This exam was performed according to the departmental dose-optimization program which includes automated exposure control, adjustment of the mA and/or kV according to patient size and/or use of iterative reconstruction technique. COMPARISON:  09/15/2016. FINDINGS: CT HEAD FINDINGS Brain: No acute intracranial hemorrhage or midline shift. There is a stable extra-axial soft tissue density in the temporal fossa on the right, compatible with known meningioma. Mild atrophy is noted. Subcortical and periventricular white matter hypodensities are noted bilaterally. No hydrocephalus. Vascular: Atherosclerotic calcification of the carotid siphons and vertebral arteries. No hyperdense vessel. Skull: Normal. Negative for fracture or focal lesion. Sinuses/Orbits: A few opacities are noted in the ethmoid air cells on the right. No acute orbital abnormality. Other: Old healed nasal bone fractures are noted bilaterally and there is facial bones hardware bilaterally. CT CERVICAL SPINE FINDINGS Alignment: Normal. Skull base and vertebrae: No acute fracture. No primary  bone lesion or focal pathologic process. Soft tissues and spinal canal: No prevertebral fluid or swelling. No visible canal hematoma. Disc levels: Intervertebral disc space narrowing, degenerative endplate changes, and facet arthropathy is noted resulting in mild-to-moderate spinal canal and moderate to severe neural foraminal stenosis. Upper chest: Negative. Other: Carotid artery calcification. Surgical clips are noted in the region of the thyroid bed. IMPRESSION: 1. No acute intracranial process. 2. Atrophy with chronic microvascular ischemic changes. 3. Stable soft tissue density in the temporal fossa on the right, compatible with known meningioma. 4. Multilevel degenerative changes in the cervical spine without evidence of acute fracture. Electronically Signed   By: Brett Fairy M.D.   On: 06/11/2021 00:55  ? ?CT Cervical Spine Wo Contrast ? ?Result Date: 06/11/2021 ?CLINICAL DATA:  Fall, head and neck trauma. EXAM: CT HEAD WITHOUT CONTRAST CT CERVICAL SPINE WITHOUT CONTRAST TECHNIQUE: Multidetector CT imaging of the head  and cervical spine was performed following the standard protocol without intravenous contrast. Multiplanar CT image reconstructions of the cervical spine were also generated. RADIATION DOSE REDUCTION: This exam was performed according to the departmental dose-optimization program which includes automated exposure control, adjustment of the mA and/or kV according to patient size and/or use of iterative reconstruction technique. COMPARISON:  09/15/2016. FINDINGS: CT HEAD FINDINGS Brain: No acute intracranial hemorrhage or midline shift. There is a stable extra-axial soft tissue density in the temporal fossa on the right, compatible with known meningioma. Mild atrophy is noted. Subcortical and periventricular white matter hypodensities are noted bilaterally. No hydrocephalus. Vascular: Atherosclerotic calcification of the carotid siphons and vertebral arteries. No hyperdense vessel. Skull: Normal.  Negative for fracture or focal lesion. Sinuses/Orbits: A few opacities are noted in the ethmoid air cells on the right. No acute orbital abnormality. Other: Old healed nasal bone fractures are noted bilatera

## 2021-06-12 NOTE — TOC Transition Note (Signed)
Transition of Care (TOC) - CM/SW Discharge Note ? ? ?Patient Details  ?Name: SHIANNA BALLY ?MRN: [id number] ?Date of Birth: 08/10/1945 ? ?Transition of Care (TOC) CM/SW Contact:  ?Cyndi Bender, RN ?Phone Number: ?06/12/2021, 3:52 PM ? ? ?Clinical Narrative:    ?Patient stable to go home with hospice. ?Bevely Palmer with [facility] notified of discharge and that Daughter requesting hospital bed. ?[facility] called for transport home. ? ?  ?Barriers to Discharge: Barriers Resolved ? ? ?Patient Goals and CMS Choice ?Patient states their goals for this hospitalization and ongoing recovery are:: go home ?  ?  ? ?Discharge Placement ?  ?           ?  ? home ?  ?  ? ?Discharge Plan and Services ?  ?  ?          Home with hospice ?  ?  ?  ?  ?  ?  ?  ?  ?  ?  ? ?Social Determinants of Health (SDOH) Interventions ?  ? ? ?Readmission Risk Interventions ?Readmission Risk Prevention Plan 06/12/2021  ?Post Dischage Appt Complete  ?Medication Screening Complete  ?Transportation Screening Complete  ?Some recent data might be hidden  ? ? ? ? ? ?

## 2021-06-12 NOTE — Progress Notes (Addendum)
[place] [id number] - [facility]) Hospitalized patient note. ?  ?This is a current [facility] home hospice patient with a terminal diagnosis of abnormal weight loss.  Patient was admitted to hospice service on 06/10/2021. [facility] received a call from the patient's daughter reporting the patient was experiencing chest pain and was concern patient was having a heart attack. When call was returned to daughter she stated she had taken patient to [facility] ER. Patient was admitted with chest pain. Per Dr. Cherie Ouch with [facility] this is a hospice related admission. ?  ?Visited at bedside. Patient awake and pleasantly confused. No visitors present. Spoke with daughter by phone. Per TOC she will discharge home later today.  ? ?VS: 98.1, 168/93, 77, 19, 97% RA ?I/O: 612.2/900 ? ?Abnormal Labs: ?06/12/21 04:19 ?Glucose: 117 (H) ?Hemoglobin: 15.3 (H) ? ?NSTEMI  ?- Patient presented complaining of chest pain (patient unable to describe symptoms due to advanced dementia)  ?- hsTn 284>>298>>250 ?- Echo with new WMA, newly reduced EF to 35-40% ?- As patient is on palliative care and wishes to avoid invasive diagnostic or therapeutic interventions, we will continue to manage medically  ?- Continue IV Heparin for 48 hours (to be completed early tomorrow AM)  ?- Continue lopressor 25 mg BID  ?- Documented intolerance of statins ?- Daily ASA  ? ?Problem list: ?Left Ventricular Thrombus  ?- Echo this admission showed an ovoid, large (20 x16 mm diameter) hyperechoic partially mobile mass attached with a relatively narrow base (7 mm) to the inferoapical endocardium, most likely an infarction-related thrombus. ?- Dr Harl Bowie discussed goals of care with [name] (patient's daughter). After patient receives 48 hours of IV heparin, plan to transition to eliquis 5 mg BID  ?  ?Chronic combined systolic and diastolic heart failure  ?- Echo this admission showed EF 89-21%, grade I diastolic dysfunction  ?- Patient euvolemic on exam, O2 sats  stable on room air  ?- Continue metoprolol, lisinopril  ?  ?Nonsustained V-tach  ?- Telemetry showed a 4 beat run of V-tach this AM around 0500 ?- K 3.8, mag 1.7  ?- Continue BB ?- Continue on telemetry  ?  ?Otherwise per primary  ?-Dementia  ?- Hypothyroidism  ?- Abnormal weight loss (per hospice)  ?    ?Discharge Planning: Return home with hospice, discharging home later today ?Family Contact: Spoke with daughter  ?IDG: updated ?Goals of Care: DNR. No invasive procedures or aggressive therapies. Treat medically for chest pain and NSTEMI ?  ?Should patient need ambulance transport at discharge please use [facility] as [facility] contracts this service with them for our active hospice patients. ?  ?Please do not hesitate to call with questions.   ?Thank you,   ?Farrel Gordon, RN, CCM      ?[facility] Liaison   ?[phone] ?

## 2021-06-18 ENCOUNTER — Telehealth (HOSPITAL_COMMUNITY): Payer: Self-pay

## 2021-06-18 ENCOUNTER — Other Ambulatory Visit (HOSPITAL_COMMUNITY): Payer: Self-pay

## 2021-06-18 NOTE — Telephone Encounter (Signed)
Pharmacy Transitions of Care Follow-up Telephone Call ? ?Date of discharge: 06/12/21  ?Discharge Diagnosis: NSTEMI ? ?How have you been since you were released from the hospital? Spoke with patient's daughter. Patient is in hospice care and receiving assistance, no questions about meds at this time.  ? ?Medication changes made at discharge: ?    START taking: ?Eliquis (apixaban)  ?metoprolol tartrate (LOPRESSOR)  ?OLANZapine (ZYPREXA)  ?CHANGE how you take: ?levothyroxine (SYNTHROID)  ? ?Medication changes verified by the patient? Yes ?  ? ?Medication Accessibility: ? ?Home Pharmacy: [facility]  ? ?Was the patient provided with refills on discharged medications? No  ? ?Have all prescriptions been transferred from [facility] to home pharmacy? N/A  ? ?Is the patient able to afford medications? Has insurance ?  ? ?Medication Review: ?  ?APIXABAN (ELIQUIS)  ?Apixaban 5 mg BID initiated on 06/12/21.  ?- Discussed importance of taking medication around the same time everyday  ?- Advised patient of medications to avoid (NSAIDs, ASA)  ?- Educated that Tylenol (acetaminophen) will be the preferred analgesic to prevent risk of bleeding  ?- Emphasized importance of monitoring for signs and symptoms of bleeding (abnormal bruising, prolonged bleeding, nose bleeds, bleeding from gums, discolored urine, black tarry stools)  ?- Advised patient to alert all providers of anticoagulation therapy prior to starting a new medication or having a procedure  ? ? ?Follow-up Appointments: ? ?[facility] f/u appt confirmed? Scheduled to see Dr. Harl Bowie on 07/15/21 @ 8:20am.  ? ?If their condition worsens, is the pt aware to call PCP or go to the Emergency Dept.? Yes ? ?Final Patient Assessment: ?Patient has f/u scheduled and refills at home pharmacy ? ?

## 2021-07-11 ENCOUNTER — Encounter: Payer: Self-pay | Admitting: Internal Medicine

## 2021-07-15 ENCOUNTER — Ambulatory Visit: Payer: Medicare PPO | Admitting: Internal Medicine

## 2021-07-30 DIAGNOSIS — Z111 Encounter for screening for respiratory tuberculosis: Secondary | ICD-10-CM | POA: Diagnosis not present

## 2021-07-31 ENCOUNTER — Ambulatory Visit: Payer: Medicare PPO | Admitting: Physician Assistant

## 2021-08-07 ENCOUNTER — Other Ambulatory Visit (HOSPITAL_COMMUNITY): Payer: Self-pay

## 2021-09-25 ENCOUNTER — Telehealth: Payer: Self-pay | Admitting: Physician Assistant

## 2021-09-25 NOTE — Telephone Encounter (Signed)
Patient's daughter called with questions for the clinical staff. Patient is now in a memory care facility,  [facility], but she has been getting more aggressive and violent with other residents in the facility.  Is there something recommended like a medication that will help calm her down?

## 2021-09-25 NOTE — Telephone Encounter (Signed)
Notified to ask facility. Thanked me for calling.

## 2021-10-03 DIAGNOSIS — N390 Urinary tract infection, site not specified: Secondary | ICD-10-CM | POA: Diagnosis not present

## 2021-11-11 ENCOUNTER — Telehealth: Payer: Self-pay | Admitting: Physician Assistant

## 2021-11-11 NOTE — Telephone Encounter (Signed)
Patient's daughter [name] is requesting a letter from [place]aren[place] But[place], [place] stating something like: due to alzheimer's patient no longer has the capacity to act in her best interest.  [name], patient's daughter will come an pick up the letter when ready, aware takes 7-10 business day for completion.  They are preparing to sell the patient's house.

## 2021-11-11 NOTE — Telephone Encounter (Signed)
Pt daughter called an informed that Clarise Cruz PA stated at the letters requested are legal letters not performed by [place]. Please contact Dr. Anthoney Harada for assessment of decision of mental capacity and competency [phone]  You can also call [place] [phone] for guidance on geriatric dementia issues. Pt daughter only took the number for Dr Dillard Essex

## 2021-11-11 NOTE — Telephone Encounter (Signed)
Pt daughter called no answer left a voice mail to call back  

## 2022-01-03 DIAGNOSIS — F02B11 Dementia in other diseases classified elsewhere, moderate, with agitation: Secondary | ICD-10-CM | POA: Diagnosis not present

## 2022-01-03 DIAGNOSIS — G300 Alzheimer's disease with early onset: Secondary | ICD-10-CM | POA: Diagnosis not present

## 2022-07-18 ENCOUNTER — Other Ambulatory Visit: Payer: Self-pay

## 2022-07-21 ENCOUNTER — Ambulatory Visit: Payer: Medicare PPO | Admitting: Podiatry

## 2022-07-21 ENCOUNTER — Encounter: Payer: Self-pay | Admitting: Podiatry

## 2022-07-21 DIAGNOSIS — B351 Tinea unguium: Secondary | ICD-10-CM

## 2022-07-21 DIAGNOSIS — M79674 Pain in right toe(s): Secondary | ICD-10-CM | POA: Diagnosis not present

## 2022-07-21 DIAGNOSIS — M79675 Pain in left toe(s): Secondary | ICD-10-CM

## 2022-07-21 DIAGNOSIS — Z7901 Long term (current) use of anticoagulants: Secondary | ICD-10-CM | POA: Diagnosis not present

## 2022-07-21 NOTE — Progress Notes (Signed)
  Subjective:  Patient ID: Shannon Grant, female    DOB: 05-Jun-1945,   MRN: [id number]  Chief Complaint  Patient presents with   Nail Problem     Routine foot care     77 y.o. female presents for concern of thickened elongated and painful nails that are difficult to trim. Requesting to have them trimmed today. Patient is on eliquis and at risk for foot care  PCP:  Shirlean Mylar, MD    . Denies any other pedal complaints. Denies n/v/f/c.   Past Medical History:  Diagnosis Date   Arthritis    Breast cancer 01/24/13   right, inv mammary   Cervical radiculopathy    Fibroid    High cholesterol    History of blood transfusion    History of chemotherapy    History of hiatal hernia    History of urinary tract infection    Hx of radiation therapy 08/23/13- 10/05/13   right breast 5000 cGy 25 sessions, right breast boost 1000 cGy 5 sessions   Hypertension    Hypothyroidism    Pain    left shoulder radiating down left arm and hand / numbness and tingling pt states MD has informed her that it is related to neck issues   Personal history of chemotherapy 2014   rt breast   Personal history of radiation therapy 2014   rt breast   Thyroid disease    hypothyroid   Trochanteric bursitis of right hip     Objective:  Physical Exam: Vascular: DP/PT pulses 2/4 bilateral. CFT <3 seconds. Normal hair growth on digits. No edema.  Skin. No lacerations or abrasions bilateral feet. Nails 1-5 bilateral are thickened elongated and discolored with subungual debris.  Musculoskeletal: MMT 5/5 bilateral lower extremities in DF, PF, Inversion and Eversion. Deceased ROM in DF of ankle joint.  Neurological: Sensation intact to light touch.   Assessment:   1. Pain due to onychomycosis of toenails of both feet   2. Chronic anticoagulation      Plan:  Patient was evaluated and treated and all questions answered. -Discussed supportive shoes at all times and checking feet regularly.  -Mechanically  debrided all nails 1-5 bilateral using sterile nail nipper and filed with dremel without incident  -Answered all patient questions -Patient to return  in 3 months for at risk foot care -Patient advised to call the office if any problems or questions arise in the meantime.   Louann Sjogren, DPM

## 2022-07-23 NOTE — Progress Notes (Signed)
PATIENT NAME: Shannon Grant DOB: September 08, 1945 MRN: [id number]  PRIMARY CARE PROVIDER: Shirlean Mylar, MD  RESPONSIBLE PARTY:  Acct ID - Guarantor Home Phone Work Phone Relationship Acct Type  [id number] Honor Junes* [phone] Self P/F     [address], [place], [place] [address]   I connected with Carolyn-daughter CHIFFON KITTLESON on 07/23/22 by telephonie and verified that I am speaking with the correct person using two identifiers.   I discussed the limitations of evaluation and management by telemedicine. The patient expressed understanding and agreed to proceed.   Patient discharged from hospice services on 07/16/22 due to stability.  Connected with patient's daughter who advises she is doing well.  Patient is at [facility] Monday-Friday 8-5 pm.  Visit would need to be completed there however daughter would be able to join a visit either at facility or at her home from 1-3 during the week.  Initial visit scheduled for next week at [facility].       Truitt Merle, RN

## 2022-07-30 ENCOUNTER — Other Ambulatory Visit: Payer: Self-pay

## 2022-07-30 VITALS — BP 164/84 | HR 60 | Temp 97.5°F

## 2022-07-30 DIAGNOSIS — Z515 Encounter for palliative care: Secondary | ICD-10-CM

## 2022-07-30 NOTE — Progress Notes (Signed)
PATIENT NAME: Shannon Grant DOB: 1945-10-24 MRN: [id number]  PRIMARY CARE PROVIDER: Shirlean Mylar, MD  RESPONSIBLE PARTY:  Acct ID - Guarantor Home Phone Work Phone Relationship Acct Type  [id number] EVELINE, SAUVE* [phone] Self P/F     [address], [place], [place] [address]   Visit completed with patient at [facility] on [facility].   Connected with daughter [name] by phone to update her on my visit.   ACP:  Patient is a DNR.  Daughter reports DNR is in the home.  MOST form on file.  Daughter advised medical goals are to manage symptoms not looking for aggressive treatment.  Functional Status:   Patient is ambulatory without assistive devices.  No recent falls reported.  Daughter is assisting with dressing and grooming.  Bathing became an issue at home and patient was combative with daughter.  Patient now gets her showers at [employer].  Patient is incontinent of bladder and wears a depends 24/7.   Daughter reports incontinence is better during the day as she is toileted routinely at [employer].  Dementia:  FAST SCORE:  6D.  Patient is able to engage in conversation.  Some difficulty in finding words and gaps in stories.  Daughter reports seeing worsening memory over the last year.    Pain:  Daughter reports patient "hurt" her ankle last week.  Bilateral knees have worsening pain.  Patient to be seen by ortho next week for cortisone injections.   Daughter agreeable with Palliative Care continuing to see patient in the facility.  She has requested follow up calls after visits.  Direct number provided should daughter need to contact me for changes.  CODE STATUS: DNR ADVANCED DIRECTIVES: No MOST FORM: Yes PPS: 50%   PHYSICAL EXAM:   VITALS: Today's Vitals   07/30/22 1035  BP: (!) 164/84  Pulse: 60  Temp: (!) 97.5 F (36.4 C)  SpO2: 99%    LUNGS: clear to auscultation  CARDIAC: Cor RRR}  EXTREMITIES: Trace bilateral lower extremity  edema SKIN: Skin color, texture, turgor normal. No rashes or lesions or mobility and turgor normal  NEURO: positive for memory problems       Truitt Merle, RN

## 2022-08-26 ENCOUNTER — Other Ambulatory Visit: Payer: Self-pay

## 2022-08-26 VITALS — BP 128/84 | HR 66 | Temp 97.6°F

## 2022-08-26 DIAGNOSIS — Z515 Encounter for palliative care: Secondary | ICD-10-CM

## 2022-08-26 NOTE — Progress Notes (Signed)
PATIENT NAME: Shannon Grant DOB: 12/01/1945 MRN: [id number]  PRIMARY CARE PROVIDER: Shirlean Mylar, MD  RESPONSIBLE PARTY:  Acct ID - Guarantor Home Phone Work Phone Relationship Acct Type  [id number] Shannon, NOVICKI* [phone] Self P/F     [address], [place], [place] [address]   Visit completed with patient at [facility] on [facility]. Connected with daughter Shannon Grant by phone.   Dementia/Fast Score 6D:  Patient engages easily in conversation.  Has difficulty with memory recall. Does not remember she has a grandchild but thinks one is on the way.  Ambulatory without assistive devices.  No recent falls reported.  Incontinent of bladder and wearing depends.  No recent infections noted.  Daughter continues to assist with ADL's except for showers.  [facility] staff showers patient at the facility as patient tends to be more combative when daughter does this at home.  Staff report no new concerns for patient.   Pain:  None reported by patient.   Phone Call:  Spoke with daughter Shannon Grant after visit.  She has no new concerns or questions at this time.  Patient has been doing well and no changes since my last visit.  Advised of visit scheduled for next month.    CODE STATUS: DNR ADVANCED DIRECTIVES: N MOST FORM: Yes PPS: 50%   PHYSICAL EXAM:   VITALS: Today's Vitals   08/26/22 0845  BP: 128/84  Pulse: 66  Temp: 97.6 F (36.4 C)  SpO2: 97%  PainSc: 0-No pain    LUNGS: clear to auscultation  CARDIAC: Cor RRR}  EXTREMITIES: - for edema.  Moves all extremities easily. SKIN: Skin color, texture, turgor normal. No rashes or lesions or mobility and turgor normal  NEURO: positive for memory problems       Truitt Merle, RN

## 2022-09-22 DIAGNOSIS — R451 Restlessness and agitation: Secondary | ICD-10-CM | POA: Diagnosis not present

## 2022-09-22 DIAGNOSIS — Z0000 Encounter for general adult medical examination without abnormal findings: Secondary | ICD-10-CM | POA: Diagnosis not present

## 2022-09-22 DIAGNOSIS — G300 Alzheimer's disease with early onset: Secondary | ICD-10-CM | POA: Diagnosis not present

## 2022-09-22 DIAGNOSIS — E039 Hypothyroidism, unspecified: Secondary | ICD-10-CM | POA: Diagnosis not present

## 2022-09-22 DIAGNOSIS — Z743 Need for continuous supervision: Secondary | ICD-10-CM | POA: Diagnosis not present

## 2022-09-25 ENCOUNTER — Other Ambulatory Visit: Payer: Self-pay

## 2022-12-12 DIAGNOSIS — I700 Atherosclerosis of aorta: Secondary | ICD-10-CM | POA: Diagnosis not present

## 2022-12-12 DIAGNOSIS — I10 Essential (primary) hypertension: Secondary | ICD-10-CM | POA: Diagnosis not present

## 2022-12-12 DIAGNOSIS — E039 Hypothyroidism, unspecified: Secondary | ICD-10-CM | POA: Diagnosis not present

## 2022-12-12 DIAGNOSIS — F0390 Unspecified dementia without behavioral disturbance: Secondary | ICD-10-CM | POA: Diagnosis not present

## 2022-12-12 DIAGNOSIS — H1013 Acute atopic conjunctivitis, bilateral: Secondary | ICD-10-CM | POA: Diagnosis not present

## 2022-12-18 DIAGNOSIS — M1711 Unilateral primary osteoarthritis, right knee: Secondary | ICD-10-CM | POA: Diagnosis not present

## 2022-12-22 ENCOUNTER — Ambulatory Visit: Payer: Medicare PPO | Admitting: Podiatry

## 2022-12-29 DIAGNOSIS — I10 Essential (primary) hypertension: Secondary | ICD-10-CM | POA: Diagnosis not present

## 2022-12-29 DIAGNOSIS — E039 Hypothyroidism, unspecified: Secondary | ICD-10-CM | POA: Diagnosis not present

## 2023-01-05 ENCOUNTER — Ambulatory Visit: Payer: Medicare PPO | Admitting: Podiatry

## 2023-05-22 ENCOUNTER — Ambulatory Visit: Payer: Medicare PPO | Admitting: Podiatry

## 2023-06-17 ENCOUNTER — Ambulatory Visit: Admitting: Podiatry

## 2023-06-17 ENCOUNTER — Encounter: Payer: Self-pay | Admitting: Podiatry

## 2023-06-17 DIAGNOSIS — B351 Tinea unguium: Secondary | ICD-10-CM

## 2023-06-17 DIAGNOSIS — M79675 Pain in left toe(s): Secondary | ICD-10-CM | POA: Diagnosis not present

## 2023-06-17 DIAGNOSIS — Z7901 Long term (current) use of anticoagulants: Secondary | ICD-10-CM | POA: Diagnosis not present

## 2023-06-17 DIAGNOSIS — M79674 Pain in right toe(s): Secondary | ICD-10-CM

## 2023-06-17 NOTE — Progress Notes (Signed)
  Subjective:  Patient ID: Shannon Grant, female    DOB: 1945/09/17,   MRN: [id number]  Chief Complaint  Patient presents with   Nail Problem    Routine foot care.    78 y.o. female presents for concern of thickened elongated and painful nails that are difficult to trim. Requesting to have them trimmed today. Patient is on eliquis and at risk for foot care  PCP:  Shon Hale, MD    . Denies any other pedal complaints. Denies n/v/f/c.   Past Medical History:  Diagnosis Date   Arthritis    Breast cancer (HCC) 01/24/13   right, inv mammary   Cervical radiculopathy    Fibroid    High cholesterol    History of blood transfusion    History of chemotherapy    History of hiatal hernia    History of urinary tract infection    Hx of radiation therapy 08/23/13- 10/05/13   right breast 5000 cGy 25 sessions, right breast boost 1000 cGy 5 sessions   Hypertension    Hypothyroidism    Pain    left shoulder radiating down left arm and hand / numbness and tingling pt states MD has informed her that it is related to neck issues   Personal history of chemotherapy 2014   rt breast   Personal history of radiation therapy 2014   rt breast   Thyroid disease    hypothyroid   Trochanteric bursitis of right hip     Objective:  Physical Exam: Vascular: DP/PT pulses 2/4 bilateral. CFT <3 seconds. Normal hair growth on digits. No edema.  Skin. No lacerations or abrasions bilateral feet. Nails 1-5 bilateral are thickened elongated and discolored with subungual debris.  Musculoskeletal: MMT 5/5 bilateral lower extremities in DF, PF, Inversion and Eversion. Deceased ROM in DF of ankle joint.  Neurological: Sensation intact to light touch.   Assessment:   1. Pain due to onychomycosis of toenails of both feet   2. Chronic anticoagulation      Plan:  Patient was evaluated and treated and all questions answered. -Discussed supportive shoes at all times and checking feet regularly.   -Mechanically debrided all nails 1-5 bilateral using sterile nail nipper and filed with dremel without incident  -Answered all patient questions -Patient to return  in 3 months for at risk foot care -Patient advised to call the office if any problems or questions arise in the meantime.   Louann Sjogren, DPM

## 2023-06-18 DIAGNOSIS — M170 Bilateral primary osteoarthritis of knee: Secondary | ICD-10-CM | POA: Diagnosis not present

## 2023-06-18 DIAGNOSIS — M1712 Unilateral primary osteoarthritis, left knee: Secondary | ICD-10-CM | POA: Diagnosis not present

## 2023-06-18 DIAGNOSIS — M1711 Unilateral primary osteoarthritis, right knee: Secondary | ICD-10-CM | POA: Diagnosis not present

## 2023-11-06 DIAGNOSIS — G300 Alzheimer's disease with early onset: Secondary | ICD-10-CM | POA: Diagnosis not present

## 2023-11-06 DIAGNOSIS — E039 Hypothyroidism, unspecified: Secondary | ICD-10-CM | POA: Diagnosis not present

## 2023-11-06 DIAGNOSIS — Z0000 Encounter for general adult medical examination without abnormal findings: Secondary | ICD-10-CM | POA: Diagnosis not present

## 2023-11-06 DIAGNOSIS — R451 Restlessness and agitation: Secondary | ICD-10-CM | POA: Diagnosis not present

## 2023-11-06 DIAGNOSIS — Z743 Need for continuous supervision: Secondary | ICD-10-CM | POA: Diagnosis not present

## 2023-12-16 ENCOUNTER — Ambulatory Visit: Admitting: Podiatry

## 2023-12-16 ENCOUNTER — Encounter: Payer: Self-pay | Admitting: Podiatry

## 2023-12-16 DIAGNOSIS — M79675 Pain in left toe(s): Secondary | ICD-10-CM

## 2023-12-16 DIAGNOSIS — M79674 Pain in right toe(s): Secondary | ICD-10-CM

## 2023-12-16 DIAGNOSIS — B351 Tinea unguium: Secondary | ICD-10-CM | POA: Diagnosis not present

## 2023-12-16 DIAGNOSIS — Z7901 Long term (current) use of anticoagulants: Secondary | ICD-10-CM

## 2023-12-16 NOTE — Progress Notes (Signed)
  Subjective:  Patient ID: Shannon Grant, female    DOB: 05/15/1945,   MRN: [id number]  Chief Complaint  Patient presents with   Nail Problem    Just a nail trim.    78 y.o. female presents for concern of thickened elongated and painful nails that are difficult to trim. Requesting to have them trimmed today. Patient is on eliquis  and at risk for foot care  PCP:  Chrystal Lamarr RAMAN, MD    . Denies any other pedal complaints. Denies n/v/f/c.   Past Medical History:  Diagnosis Date   Arthritis    Breast cancer (HCC) 01/24/13   right, inv mammary   Cervical radiculopathy    Fibroid    High cholesterol    History of blood transfusion    History of chemotherapy    History of hiatal hernia    History of urinary tract infection    Hx of radiation therapy 08/23/13- 10/05/13   right breast 5000 cGy 25 sessions, right breast boost 1000 cGy 5 sessions   Hypertension    Hypothyroidism    Pain    left shoulder radiating down left arm and hand / numbness and tingling pt states MD has informed her that it is related to neck issues   Personal history of chemotherapy 2014   rt breast   Personal history of radiation therapy 2014   rt breast   Thyroid  disease    hypothyroid   Trochanteric bursitis of right hip     Objective:  Physical Exam: Vascular: DP/PT pulses 2/4 bilateral. CFT <3 seconds. Normal hair growth on digits. No edema.  Skin. No lacerations or abrasions bilateral feet. Nails 1-5 bilateral are thickened elongated and discolored with subungual debris.  Musculoskeletal: MMT 5/5 bilateral lower extremities in DF, PF, Inversion and Eversion. Deceased ROM in DF of ankle joint.  Neurological: Sensation intact to light touch.   Assessment:   1. Pain due to onychomycosis of toenails of both feet   2. Chronic anticoagulation      Plan:  Patient was evaluated and treated and all questions answered. -Discussed supportive shoes at all times and checking feet regularly.   -Mechanically debrided all nails 1-5 bilateral using sterile nail nipper and filed with dremel without incident  -Answered all patient questions -Patient to return  in 3 months for at risk foot care -Patient advised to call the office if any problems or questions arise in the meantime.   Asberry Failing, DPM
